# Patient Record
Sex: Female | Born: 1950
Health system: Southern US, Community
[De-identification: ages and names within clinical notes are randomized; demographics above are authoritative.]

## PROBLEM LIST (undated history)

## (undated) DIAGNOSIS — K219 Gastro-esophageal reflux disease without esophagitis: Secondary | ICD-10-CM

## (undated) DIAGNOSIS — T8859XA Other complications of anesthesia, initial encounter: Secondary | ICD-10-CM

## (undated) DIAGNOSIS — E039 Hypothyroidism, unspecified: Secondary | ICD-10-CM

## (undated) DIAGNOSIS — S99911A Unspecified injury of right ankle, initial encounter: Secondary | ICD-10-CM

## (undated) DIAGNOSIS — R112 Nausea with vomiting, unspecified: Secondary | ICD-10-CM

## (undated) DIAGNOSIS — Z8379 Family history of other diseases of the digestive system: Secondary | ICD-10-CM

## (undated) DIAGNOSIS — Z9889 Other specified postprocedural states: Secondary | ICD-10-CM

## (undated) DIAGNOSIS — J45909 Unspecified asthma, uncomplicated: Secondary | ICD-10-CM

## (undated) DIAGNOSIS — C649 Malignant neoplasm of unspecified kidney, except renal pelvis: Secondary | ICD-10-CM

## (undated) DIAGNOSIS — N189 Chronic kidney disease, unspecified: Secondary | ICD-10-CM

## (undated) DIAGNOSIS — M25579 Pain in unspecified ankle and joints of unspecified foot: Secondary | ICD-10-CM

## (undated) DIAGNOSIS — J449 Chronic obstructive pulmonary disease, unspecified: Secondary | ICD-10-CM

## (undated) DIAGNOSIS — Z85528 Personal history of other malignant neoplasm of kidney: Secondary | ICD-10-CM

## (undated) DIAGNOSIS — F0781 Postconcussional syndrome: Secondary | ICD-10-CM

## (undated) DIAGNOSIS — C4491 Basal cell carcinoma of skin, unspecified: Secondary | ICD-10-CM

## (undated) DIAGNOSIS — R51 Headache: Secondary | ICD-10-CM

## (undated) DIAGNOSIS — G4700 Insomnia, unspecified: Principal | ICD-10-CM

## (undated) DIAGNOSIS — Z90710 Acquired absence of both cervix and uterus: Secondary | ICD-10-CM

## (undated) DIAGNOSIS — L409 Psoriasis, unspecified: Secondary | ICD-10-CM

## (undated) HISTORY — DX: Basal cell carcinoma of skin, unspecified: C44.91

## (undated) HISTORY — PX: BREAST BIOPSY: SHX20

## (undated) HISTORY — DX: Chronic kidney disease, unspecified: N18.9

## (undated) HISTORY — DX: Postconcussional syndrome: F07.81

## (undated) HISTORY — PX: ELBOW SURGERY: SHX618

## (undated) HISTORY — DX: Insomnia, unspecified: G47.00

## (undated) HISTORY — DX: Malignant neoplasm of unspecified kidney, except renal pelvis: C64.9

## (undated) HISTORY — DX: Headache: R51

## (undated) HISTORY — PX: BACK SURGERY: SHX140

## (undated) HISTORY — PX: ABDOMINAL HYSTERECTOMY: SHX81

## (undated) HISTORY — PX: NEPHRECTOMY: SHX65

---

## 1977-10-15 HISTORY — PX: OTHER SURGICAL HISTORY: SHX169

## 1993-10-15 HISTORY — PX: ABDOMINAL HYSTERECTOMY: SHX81

## 1994-10-15 HISTORY — PX: CHOLECYSTECTOMY: SHX55

## 1995-10-16 HISTORY — PX: ELBOW SURGERY: SHX618

## 1998-07-13 ENCOUNTER — Other Ambulatory Visit: Admission: RE | Admit: 1998-07-13 | Discharge: 1998-07-13 | Payer: Self-pay | Admitting: Obstetrics and Gynecology

## 1998-07-22 ENCOUNTER — Other Ambulatory Visit: Admission: RE | Admit: 1998-07-22 | Discharge: 1998-07-22 | Payer: Self-pay | Admitting: Obstetrics and Gynecology

## 1998-09-26 ENCOUNTER — Ambulatory Visit (HOSPITAL_COMMUNITY): Admission: RE | Admit: 1998-09-26 | Discharge: 1998-09-26 | Payer: Self-pay | Admitting: Surgery

## 1998-09-26 ENCOUNTER — Encounter: Payer: Self-pay | Admitting: Surgery

## 1999-08-01 ENCOUNTER — Other Ambulatory Visit: Admission: RE | Admit: 1999-08-01 | Discharge: 1999-08-01 | Payer: Self-pay | Admitting: Obstetrics and Gynecology

## 2000-09-25 ENCOUNTER — Other Ambulatory Visit: Admission: RE | Admit: 2000-09-25 | Discharge: 2000-09-25 | Payer: Self-pay | Admitting: Obstetrics and Gynecology

## 2000-10-15 HISTORY — PX: OTHER SURGICAL HISTORY: SHX169

## 2001-06-07 ENCOUNTER — Emergency Department (HOSPITAL_COMMUNITY): Admission: EM | Admit: 2001-06-07 | Discharge: 2001-06-07 | Payer: Self-pay | Admitting: Emergency Medicine

## 2001-06-07 ENCOUNTER — Encounter: Payer: Self-pay | Admitting: *Deleted

## 2001-06-10 ENCOUNTER — Emergency Department (HOSPITAL_COMMUNITY): Admission: EM | Admit: 2001-06-10 | Discharge: 2001-06-10 | Payer: Self-pay | Admitting: *Deleted

## 2001-10-13 ENCOUNTER — Other Ambulatory Visit: Admission: RE | Admit: 2001-10-13 | Discharge: 2001-10-13 | Payer: Self-pay | Admitting: Obstetrics and Gynecology

## 2001-10-15 HISTORY — PX: STOMACH SURGERY: SHX791

## 2001-10-29 ENCOUNTER — Encounter: Payer: Self-pay | Admitting: Family Medicine

## 2001-10-29 ENCOUNTER — Ambulatory Visit (HOSPITAL_COMMUNITY): Admission: RE | Admit: 2001-10-29 | Discharge: 2001-10-29 | Payer: Self-pay | Admitting: Family Medicine

## 2002-01-05 ENCOUNTER — Emergency Department (HOSPITAL_COMMUNITY): Admission: EM | Admit: 2002-01-05 | Discharge: 2002-01-05 | Payer: Self-pay | Admitting: *Deleted

## 2002-01-05 ENCOUNTER — Encounter: Payer: Self-pay | Admitting: *Deleted

## 2002-01-05 ENCOUNTER — Encounter: Payer: Self-pay | Admitting: Emergency Medicine

## 2002-01-10 ENCOUNTER — Encounter: Payer: Self-pay | Admitting: Emergency Medicine

## 2002-01-10 ENCOUNTER — Emergency Department (HOSPITAL_COMMUNITY): Admission: EM | Admit: 2002-01-10 | Discharge: 2002-01-10 | Payer: Self-pay | Admitting: Emergency Medicine

## 2002-07-15 ENCOUNTER — Emergency Department (HOSPITAL_COMMUNITY): Admission: EM | Admit: 2002-07-15 | Discharge: 2002-07-15 | Payer: Self-pay | Admitting: Emergency Medicine

## 2002-07-15 ENCOUNTER — Encounter: Payer: Self-pay | Admitting: Emergency Medicine

## 2002-10-09 ENCOUNTER — Ambulatory Visit (HOSPITAL_COMMUNITY): Admission: RE | Admit: 2002-10-09 | Discharge: 2002-10-09 | Payer: Self-pay | Admitting: General Surgery

## 2002-10-09 ENCOUNTER — Encounter: Payer: Self-pay | Admitting: General Surgery

## 2002-10-15 HISTORY — PX: BREAST BIOPSY: SHX20

## 2003-07-01 ENCOUNTER — Encounter: Payer: Self-pay | Admitting: Orthopedic Surgery

## 2003-07-01 ENCOUNTER — Encounter: Admission: RE | Admit: 2003-07-01 | Discharge: 2003-07-01 | Payer: Self-pay | Admitting: Orthopedic Surgery

## 2003-12-09 ENCOUNTER — Other Ambulatory Visit: Admission: RE | Admit: 2003-12-09 | Discharge: 2003-12-09 | Payer: Self-pay | Admitting: Obstetrics and Gynecology

## 2003-12-14 ENCOUNTER — Encounter: Admission: RE | Admit: 2003-12-14 | Discharge: 2003-12-14 | Payer: Self-pay | Admitting: Obstetrics and Gynecology

## 2004-05-19 ENCOUNTER — Ambulatory Visit (HOSPITAL_COMMUNITY): Admission: RE | Admit: 2004-05-19 | Discharge: 2004-05-19 | Payer: Self-pay | Admitting: Neurosurgery

## 2004-05-26 ENCOUNTER — Ambulatory Visit (HOSPITAL_COMMUNITY): Admission: RE | Admit: 2004-05-26 | Discharge: 2004-05-27 | Payer: Self-pay | Admitting: Neurosurgery

## 2004-08-25 ENCOUNTER — Ambulatory Visit (HOSPITAL_COMMUNITY): Admission: RE | Admit: 2004-08-25 | Discharge: 2004-08-25 | Payer: Self-pay | Admitting: Neurosurgery

## 2004-09-01 ENCOUNTER — Ambulatory Visit (HOSPITAL_COMMUNITY): Admission: RE | Admit: 2004-09-01 | Discharge: 2004-09-02 | Payer: Self-pay | Admitting: Neurosurgery

## 2004-11-30 ENCOUNTER — Ambulatory Visit (HOSPITAL_COMMUNITY): Admission: RE | Admit: 2004-11-30 | Discharge: 2004-11-30 | Payer: Self-pay | Admitting: Neurosurgery

## 2004-12-15 ENCOUNTER — Inpatient Hospital Stay (HOSPITAL_COMMUNITY): Admission: RE | Admit: 2004-12-15 | Discharge: 2004-12-19 | Payer: Self-pay | Admitting: Neurosurgery

## 2004-12-31 ENCOUNTER — Emergency Department (HOSPITAL_COMMUNITY): Admission: EM | Admit: 2004-12-31 | Discharge: 2004-12-31 | Payer: Self-pay | Admitting: Emergency Medicine

## 2005-07-06 ENCOUNTER — Ambulatory Visit (HOSPITAL_COMMUNITY): Admission: RE | Admit: 2005-07-06 | Discharge: 2005-07-06 | Payer: Self-pay | Admitting: Neurosurgery

## 2005-07-17 ENCOUNTER — Encounter: Admission: RE | Admit: 2005-07-17 | Discharge: 2005-07-17 | Payer: Self-pay | Admitting: Neurosurgery

## 2005-08-13 ENCOUNTER — Encounter: Admission: RE | Admit: 2005-08-13 | Discharge: 2005-08-13 | Payer: Self-pay | Admitting: Neurosurgery

## 2005-09-21 ENCOUNTER — Ambulatory Visit (HOSPITAL_COMMUNITY): Admission: RE | Admit: 2005-09-21 | Discharge: 2005-09-21 | Payer: Self-pay | Admitting: Neurosurgery

## 2007-08-27 ENCOUNTER — Ambulatory Visit (HOSPITAL_COMMUNITY): Admission: RE | Admit: 2007-08-27 | Discharge: 2007-08-27 | Payer: Self-pay | Admitting: Family Medicine

## 2007-09-01 ENCOUNTER — Ambulatory Visit (HOSPITAL_COMMUNITY): Admission: RE | Admit: 2007-09-01 | Discharge: 2007-09-01 | Payer: Self-pay | Admitting: Family Medicine

## 2007-10-16 DIAGNOSIS — N189 Chronic kidney disease, unspecified: Secondary | ICD-10-CM

## 2007-10-16 HISTORY — DX: Chronic kidney disease, unspecified: N18.9

## 2007-10-16 HISTORY — PX: NEPHRECTOMY: SHX65

## 2008-03-25 ENCOUNTER — Ambulatory Visit (HOSPITAL_COMMUNITY): Admission: RE | Admit: 2008-03-25 | Discharge: 2008-03-25 | Payer: Self-pay | Admitting: Internal Medicine

## 2008-03-26 ENCOUNTER — Emergency Department (HOSPITAL_COMMUNITY): Admission: EM | Admit: 2008-03-26 | Discharge: 2008-03-26 | Payer: Self-pay | Admitting: Emergency Medicine

## 2008-04-01 ENCOUNTER — Ambulatory Visit (HOSPITAL_COMMUNITY): Admission: RE | Admit: 2008-04-01 | Discharge: 2008-04-01 | Payer: Self-pay | Admitting: Family Medicine

## 2008-04-16 ENCOUNTER — Encounter (HOSPITAL_COMMUNITY): Admission: RE | Admit: 2008-04-16 | Discharge: 2008-05-16 | Payer: Self-pay | Admitting: Oncology

## 2008-04-16 ENCOUNTER — Ambulatory Visit (HOSPITAL_COMMUNITY): Payer: Self-pay | Admitting: Oncology

## 2008-05-26 ENCOUNTER — Ambulatory Visit (HOSPITAL_COMMUNITY): Admission: RE | Admit: 2008-05-26 | Discharge: 2008-05-26 | Payer: Self-pay | Admitting: Urology

## 2008-06-14 ENCOUNTER — Encounter (INDEPENDENT_AMBULATORY_CARE_PROVIDER_SITE_OTHER): Payer: Self-pay | Admitting: Urology

## 2008-06-14 ENCOUNTER — Inpatient Hospital Stay (HOSPITAL_COMMUNITY): Admission: RE | Admit: 2008-06-14 | Discharge: 2008-06-17 | Payer: Self-pay | Admitting: Urology

## 2008-10-15 HISTORY — PX: BACK SURGERY: SHX140

## 2008-12-28 ENCOUNTER — Ambulatory Visit (HOSPITAL_COMMUNITY): Admission: RE | Admit: 2008-12-28 | Discharge: 2008-12-28 | Payer: Self-pay | Admitting: Family Medicine

## 2009-01-13 ENCOUNTER — Ambulatory Visit (HOSPITAL_COMMUNITY): Admission: RE | Admit: 2009-01-13 | Discharge: 2009-01-13 | Payer: Self-pay | Admitting: Urology

## 2009-03-22 ENCOUNTER — Ambulatory Visit (HOSPITAL_COMMUNITY): Admission: RE | Admit: 2009-03-22 | Discharge: 2009-03-22 | Payer: Self-pay | Admitting: Family Medicine

## 2009-07-27 ENCOUNTER — Ambulatory Visit (HOSPITAL_COMMUNITY): Admission: RE | Admit: 2009-07-27 | Discharge: 2009-07-27 | Payer: Self-pay | Admitting: Family Medicine

## 2009-11-18 ENCOUNTER — Ambulatory Visit (HOSPITAL_COMMUNITY): Admission: RE | Admit: 2009-11-18 | Discharge: 2009-11-18 | Payer: Self-pay | Admitting: Family Medicine

## 2009-12-26 ENCOUNTER — Ambulatory Visit (HOSPITAL_COMMUNITY): Admission: RE | Admit: 2009-12-26 | Discharge: 2009-12-26 | Payer: Self-pay | Admitting: Neurosurgery

## 2010-04-03 ENCOUNTER — Ambulatory Visit (HOSPITAL_COMMUNITY): Admission: RE | Admit: 2010-04-03 | Discharge: 2010-04-03 | Payer: Self-pay | Admitting: Internal Medicine

## 2010-10-15 HISTORY — PX: OTHER SURGICAL HISTORY: SHX169

## 2010-11-04 ENCOUNTER — Encounter: Payer: Self-pay | Admitting: Neurosurgery

## 2010-11-05 ENCOUNTER — Encounter: Payer: Self-pay | Admitting: Family Medicine

## 2010-12-04 ENCOUNTER — Ambulatory Visit (HOSPITAL_COMMUNITY)
Admission: RE | Admit: 2010-12-04 | Discharge: 2010-12-04 | Disposition: A | Discharge status: Home / Self Care | Payer: 59 | Source: Ambulatory Visit | Attending: Family Medicine | Admitting: Family Medicine

## 2010-12-04 ENCOUNTER — Other Ambulatory Visit (HOSPITAL_COMMUNITY): Payer: Self-pay | Admitting: Family Medicine

## 2010-12-04 DIAGNOSIS — R05 Cough: Secondary | ICD-10-CM

## 2010-12-04 DIAGNOSIS — R059 Cough, unspecified: Secondary | ICD-10-CM

## 2010-12-04 DIAGNOSIS — R490 Dysphonia: Secondary | ICD-10-CM | POA: Insufficient documentation

## 2011-01-25 LAB — CREATININE, SERUM
Creatinine, Ser: 0.59 mg/dL (ref 0.4–1.2)
GFR calc Af Amer: >60 mL/min (ref >60)
GFR calc non Af Amer: >60 mL/min (ref >60)

## 2011-02-27 NOTE — Op Note (Signed)
NAME:  FORREST, DEMURO NO.:  [id number]   MEDICAL RECORD NO.:  [id number]          PATIENT TYPE:  INP   LOCATION:  1402                         FACILITY:  [facility]   PHYSICIAN:  Heloise Purpura, MD      DATE OF BIRTH:  November 26, 1950   DATE OF PROCEDURE:  06/14/2008  DATE OF DISCHARGE:                               OPERATIVE REPORT   PREOPERATIVE DIAGNOSIS:  Right renal mass.   POSTOPERATIVE DIAGNOSIS:  Right renal mass.   PROCEDURE:  Right robotic assisted laparoscopic partial nephrectomy.   SURGEON:  Heloise Purpura, MD   ASSISTANT:  Dr. Delman Kitten and Delia Chimes, nurse practitioner.   ANESTHESIA:  General.   COMPLICATIONS:  None.   ESTIMATED BLOOD LOSS:  50 mL.   INTRAVENOUS FLUIDS:  2000 mL of crystalloid and 500 mL of colloid.   SPECIMENS:  1. Right renal mass.  2. Perinephric fat.  3. Margin from the base of renal tumor resection.   DISPOSITION:  Specimens to pathology.   INTRAOPERATIVE FINDINGS:  Intraoperative margins were negative for  malignancy.   INDICATIONS:  Ms. Signore is a 60 year old female who was found to have an  incidentally detected small right renal mass that was enhancing on CT  scan and concerning for a renal cell carcinoma.  She underwent  metastatic evaluation which was negative.  After discussion regarding  management options for treatment, she elected to proceed with nephron  sparing surgery and the above procedure.  Potential risks,  complications, alternative options were discussed in detail with the  patient and informed consent was obtained.   DESCRIPTION OF PROCEDURE:  The patient was taken to the operating room  and general anesthetic was administered.  She was given preoperative  antibiotics, and placed in the right modified flank position, care was  taken to pad all potential pressure points.  Her abdomen was prepped and  draped in the usual sterile fashion.  A site was selected just to the  right of the umbilicus for  placement of the camera port.  This was  placed using a standard open Hassan technique which allowed entry into  the peritoneal cavity under direct vision without difficulty using a  standard Hassan technique.  A 12 mm port was then placed and a  pneumoperitoneum established.  The 0 degrees lens was used to inspect  the abdomen and there was no evidence for any intra-abdominal injuries.  There was noted to be adhesions in the right lower quadrant likely  result of the patient's prior hysterectomy.  A 12 mm port was placed in  the midline superior to the umbilicus and an 8 mm port was placed in the  right upper quadrant.  Laparoscopic scissors were then used to take down  the adhesions between the colon and the abdominal wall and the right  lower quadrant.  Two additional 8 mm robotic ports were then placed in  the right lower quadrant and the surgical cart was docked.  With the aid  of the cautery scissors, the white line of Toldt was incised along the  ascending colon allowing  the colon to be mobilized medially and the  space between the mesocolon and the anterior layer of Gerota's fascia to  be developed.  The patient's lower pole renal mass was almost  immediately identified and the lower pole of the kidney was carefully  freed.  The ureter was identified and traced superiorly and the renal  hilum was encountered.  A single renal vein was seen and inferior to  this renal vein there appeared to be a single renal artery that was  trifurcating toward the renal hilum consistent with the patient's  preoperative imaging.  Once the renal artery was isolated proximal to  its trifurcation, attention returned to the kidney.  The overlying  Gerota's fascia was removed from the kidney in order to free up the  lower pole of the kidney and isolate the renal mass.  The perinephric  fat overlying the renal mass was sent as a pathologic specimen.  Preparations were then made for removal of the mass and  12.5 grams of  intravenous mannitol was administered.  After an appropriate amount of  time, the renal artery was clamped with laparoscopic bulldog clamp and  the renal mass was excised with cold scissors dissection.  The margins  from the base of the resection site were sent intraoperatively for  frozen section analysis and subsequently returned negative for  malignancy.  Once the mass had been removed from the kidney.  Attention  turned to repair of the renal defect.  A 4-0 Vicryl suture was used to  close the obvious bleeding sites along the base of the resection site.  No obvious entry into the collecting system was identified.  Laparoscopic ties were used to secure the 4-0 Vicryl.  A double-armed 2-  0 Monocryl suture was then used to perform a bolster stitch to  reapproximate the renal capsule.  A Surgicel bolster was placed in the  defect prior to securing these capsular sutures.  In addition, FloSeal  was placed into the defect as an additional hemostatic measure.  At this  point the bulldog clamp was removed.  Total warm ischemia time was 15  minutes.  The lower pole of the kidney and renal hilum were examined and  hemostasis appeared excellent.  Additional FloSeal was administered and  the Gerota's fascia overlying lower pole the kidney was reapproximated  with Hem-o-lok clips.  A #15 Blake drain was then placed through the  lower quadrant 8-mm port site and positioned in the perinephric space.  It was secured to skin with a nylon suture.  The surgical cart was then  undocked.  The initial 12-mm camera port site was closed with a figure-  of-eight 0 Vicryl suture placed with the aid of the Carter-Thomason  needle.  The remaining ports were removed under direct vision and the  specimen was removed intact from the superior midline port site within  the Endopouch retrieval bag.  This fascial opening was then closed with  figure-of-eight 0 Vicryl suture.  All port sites were injected  with  0.25% Marcaine and reapproximated the skin with 4-0 Monocryl  subcuticular closures.  Dermabond was placed over the incision sites.  The patient appeared to tolerate the procedure well without  complications.  She was able to be awakened and transferred to recovery  unit in satisfactory condition.      Heloise Purpura, MD  Electronically Signed     LB/MEDQ  D:  06/14/2008  T:  06/15/2008  Job:  [id number]

## 2011-02-27 NOTE — Discharge Summary (Signed)
Theresa Holloway, Theresa Holloway NO.:  [id number]   MEDICAL RECORD NO.:  [id number]          PATIENT TYPE:  INP   LOCATION:  1402                         FACILITY:  [facility]   PHYSICIAN:  Heloise Purpura, MD      DATE OF BIRTH:  1951/02/21   DATE OF ADMISSION:  06/14/2008  DATE OF DISCHARGE:  06/17/2008                               DISCHARGE SUMMARY   ADMISSION DIAGNOSIS:  Right renal mass.   DISCHARGE DIAGNOSIS:  Renal cell carcinoma.   HISTORY AND PHYSICAL:  For full details please see admission history and  physical.  Briefly, Ms. Patnaude is a 60 year old female who was found to  have an incidentally detected small right renal mass.  After a workup  for hypercalcemia.  After undergoing a metastatic evaluation which was  negative and discussion regarding management options for treatment, she  elected to proceed with a minimally invasive partial nephrectomy.   HOSPITAL COURSE:  On June 14, 2008, the patient was taken to the  operating room and underwent a right robotic-assisted laparoscopic  partial nephrectomy.  She tolerated this procedure well without  complications.  Postoperatively, she was able to be transferred to a  regular hospital room following recovery from anesthesia.  She was kept  on strict bedrest for 24 hours and hemodynamically monitored.  She  remained stable without evidence for bleeding.  In addition, her renal  function remained stable with a serum creatinine of 0.6.  She began  ambulating on the afternoon of postoperative day #1 which she did  without significant difficulty.  She also tolerated a clear liquid diet  and was able to be transitioned to oral pain medication.  She maintained  minimal output from her pelvic drain which was sent for a creatinine  level on postoperative day #2 and returned consistent with serum at 0.5.  It was therefore removed.  She continued to have her diet advanced which  she tolerated, and by the morning of postoperative  day #3 was tolerating  a regular diet and her pain was well-controlled with oral pain  medication.  She was therefore able to be discharged home in excellent  condition.   DISPOSITION:  Home.   DISCHARGE MEDICATIONS:  She was instructed to resume her regular home  medications excepting any aspirin, nonsteroidal anti-inflammatory drugs,  or herbal supplements.  She was given a prescription to take Vicodin as  needed for pain and told to use Colace as a stool softener.   DISCHARGE INSTRUCTIONS:  She was instructed be ambulatory but  specifically told to refrain from any heavy lifting, strenuous activity,  or driving.  She was instructed to be active and to resume regular diet.   FOLLOW UP:  She will follow up in approximately 1-2 weeks for further  evaluation.   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  This result was discussed with the patient during her  hospitalization.      Heloise Purpura, MD  Electronically Signed  LB/MEDQ  D:  06/17/2008  T:  06/17/2008  Job:  [id number]

## 2011-03-02 NOTE — H&P (Signed)
NAME:  Theresa Holloway, Theresa Holloway                          ACCOUNT NO.:  [id number]   MEDICAL RECORD NO.:  [id number]                   PATIENT TYPE:  OIB   LOCATION:  NA                                   FACILITY:  [facility]   PHYSICIAN:  Hilda Lias, M.D.                DATE OF BIRTH:  1950-11-18   DATE OF ADMISSION:  05/26/2004  DATE OF DISCHARGE:                                HISTORY & PHYSICAL   Ms. Dingwall is a lady who was seen by me about a week ago in my office because  of several weeks of back pain with radiation down the right leg associated  with numbness and tingling sensation.  The patient has had conservative  treatment.  She is not any better.  When she came to see me, she was having  so much pain that she was unable to sit.  We tried to do a MRI, but she was  claustrophobic.  Later on we sedated her and a MRI was obtained and because  of the findings, she wanted to proceed with surgery.  She denies any pain in  the left leg.   PAST MEDICAL HISTORY:  1. Cholecystectomy.  2. Stomach surgery.  3. Shoulder surgery.   ALLERGIES:  1. CODEINE.  2. SULFA.  3. CORTISONE.  4. PENICILLIN.   The patient never drinks.  She smokes.   FAMILY HISTORY:  Unremarkable.   REVIEW OF SYSTEMS:  Positive for abdominal pain, some history of vomiting,  back and right leg pain.   PHYSICAL EXAMINATION:  GENERAL:  When the patient came to my office she was  limping from the right leg.  HEENT:  Normal.  NECK:  Normal.  LUNGS:  Clear.  HEART:  Sounds normal.  ABDOMEN:  There is a scar on the abdomen from previous surgery.  EXTREMITIES:  There is a scar on the shoulder.  NEURO:  Mental status normal.  Cranial nerves normal.  Strength 5/5, except  for some weakness of flexion of the right leg.  Sensation, she complains of  numbness mostly on the right foot.   The MRI __________ showed that she has a large herniated disk at the level 4-  5 central to the right.   CLINICAL IMPRESSION:  Right  L4-L5 herniated disk.   RECOMMENDATIONS:  The patient is being admitted for surgery.  Procedure will  be a right L4-L5 diskectomy.  She knows about the risks of infection, CNS  leak, worsening pain, paralysis, need for further surgery, damage to the  vessels of the abdomen.                                                Hilda Lias, M.D.    EB/MEDQ  D:  05/26/2004  T:  05/26/2004  Job:  [id number]

## 2011-03-02 NOTE — Op Note (Signed)
NAME:  Theresa Holloway, Theresa Holloway NO.:  [id number]   MEDICAL RECORD NO.:  [id number]          PATIENT TYPE:  OIB   LOCATION:  5008                         FACILITY:  [facility]   PHYSICIAN:  Hilda Lias, M.D.   DATE OF BIRTH:  1951/01/01   DATE OF PROCEDURE:  09/01/2004  DATE OF DISCHARGE:                                 OPERATIVE REPORT   PREOPERATIVE DIAGNOSIS:  Right L4-5 herniated disk, recurrent.   POSTOPERATIVE DIAGNOSIS:  Right L4-5 herniated disk, recurrent.   PROCEDURE:  Right L4-5 laminotomy, removal of four large prevertebral disks,  total diskectomy, decompression of the L5 and L4 nerve root, microscopy.   SURGEON:  Hilda Lias, M.D.   ASSISTANT:  Coletta Memos, M.D.   CLINICAL HISTORY:  The patient was admitted because of back and right leg  pain. The patient several months ago underwent right L4-5 diskectomy, and  she did really well, but now she is complaining down the right leg. She has  failed conservative care. MRI shows a large recurrent disk at L4-5  compromising the L4 and L5 nerve roots. Surgery was advised, and the risks  were explained in the history and physical.   PROCEDURE:  The patient was taken to the OR. After intubation, she was  positioned in appropriate manner. The back was prepped with Betadine. A  midline incision following the previous one was made, and muscles were  retracted laterally. X-rays showed that indeed we were at the level of L4-5.  Then with the drill, we made the space at the level of L4-5 bigger with  removal of more of the L4 and L5 lamina. Lysis of adhesions was done.  Indeed, we found that the L5 nerve root was displaced posterior and  laterally by large fragments of disk, three of them. Removal was done, and  after that, we ended up in the disk space for removal of degenerative disk  as well as degenerative end plate was removed. At the end, we had good  decompression of the L5-L4 nerve root. Valsalva maneuver was  negative. Depo-  Medrol and Fentanyl were left in the epidural space, and the wound was  closed with Vicryl and Steri-Strips.       EB/MEDQ  D:  09/01/2004  T:  09/02/2004  Job:  [id number]

## 2011-03-02 NOTE — Op Note (Signed)
NAMEPEARLINA, FRIEDLY NO.:  [id number]   MEDICAL RECORD NO.:  [id number]          PATIENT TYPE:  INP   LOCATION:  3035                         FACILITY:  [facility]   PHYSICIAN:  Hilda Lias, M.D.   DATE OF BIRTH:  09-18-1951   DATE OF PROCEDURE:  12/15/2004  DATE OF DISCHARGE:                                 OPERATIVE REPORT   PREOPERATIVE DIAGNOSIS:  Recurrent herniated disc L4-L5 x 3, degenerative  disc disease, chronic and acute L4-L5 radiculopathy.   POSTOPERATIVE DIAGNOSIS:  Recurrent herniated disc L4-L5 x 3, degenerative  disc disease, chronic and acute L4-L5 radiculopathy.   PROCEDURE:  Bilateral L4-L5 diskectomy, facetectomy, interbody fusion with  allograft and autograft, pedicle screws L4-L5, posterolateral arthrodesis  with out doing allograft, crosslink, Cell Saver, C-arm.   SURGEON:  Hilda Lias, M.D.   ASSISTANT:  Coletta Memos, M.D.   CLINICAL HISTORY:  The patient is being admitted because of back pain with  radiation down to his right leg.  The patient, in the past eight months, has  had two lumbar discectomies now and she came back with the same problem all  over again.  Because of this, we decided to go ahead with the fusion.  The  risks were explained during the history and physical.   PROCEDURE:  The patient was taken to the OR and positioned in a prone  manner.  The back was prepped with Betadine.  A midline incision following  the previous one was made. The muscles and fascia were retracted laterally.  X-ray showed that, indeed, we were at the level of L4-L5.  From then on, we  removed the spinal process of 4 and 5.  The patient has quite a bit of scar  tissue on the right side.  We removed the facet of L4 and we went laterally.  Lysis of adhesions for the L4 to L5 nerve root was done on the right side.  Then, we entered the disc space and, indeed, the patient had quite a bit of  degenerative disc with a large fragment of  approximately 4 by 3 by 2 cm.  After this was done, we entered the disc space bilaterally.  The endplates  were removed.  This was done with a curet.  Then, two pieces of dowel 12 by  26 were inserted.  In the midline and laterally, we used an autograft.  With  the C-arm, we entered the pedicles for L4-L5 and four pedicle screws of 5.5  by 40 was inserted.  Then, we went laterally at L4-L5 and a mix of autograft  was used for arthrodesis.  The pedicle was secured in place.  A crosslink  from left to right was used.  At the end, we had the area completely solid.  Valsalva maneuver was negative.  We used Tisseel in the epidural space.  From then on, the area was irrigated and closed with Vicryl and Steri-  Strips.  The patient tolerated the procedure well.      EB/MEDQ  D:  12/15/2004  T:  12/15/2004  Job:  [id number]

## 2011-03-02 NOTE — H&P (Signed)
NAMESHARISE, LIPPY NO.:  [id number]   MEDICAL RECORD NO.:  [id number]          PATIENT TYPE:  OIB   LOCATION:  2869                         FACILITY:  [facility]   PHYSICIAN:  Hilda Lias, M.D.   DATE OF BIRTH:  20-Feb-1951   DATE OF ADMISSION:  09/01/2004  DATE OF DISCHARGE:                                HISTORY & PHYSICAL   Ms. Candler is a lady who underwent lumbar diskectomy in August 2005 because  of herniated disk. She did really well, but now she came back to see me two  to three weeks ago with pain in the right leg. For the past two weeks the  pain has been going to her back, associated with weakness up to the point  that she had difficulty walking. She had an MRI and because of the findings  she is being admitted for surgery.   PAST MEDICAL HISTORY:  1.  Cholecystectomy.  2.  Stomach surgery.  3.  Right L4-5 diskectomy.   ALLERGIES:  CODEINE, PENICILLIN, MORPHINE, SULFA, and CORTISOL.   SOCIAL HISTORY:  She does not drink. She does smoke.   FAMILY HISTORY:  Unremarkable.   REVIEW OF SYSTEMS:  Positive for back pain, right leg pain, and weakness.   PHYSICAL EXAMINATION:  HEENT: Normal.  NECK: Normal.  LUNGS: Clear.  HEART: The heart sounds are normal.  ABDOMEN: There is a scar from previous surgery.  EXTREMITIES: Normal.  NEUROLOGIC: Mental status and cranial nerves normal. Strength, showed that  she had 3/5 dorsiflexion and plantar flexion of the right foot. Reflexes  symmetrical. Sensation--she has __________ sensation which involves the L5  nerve root.   IMPRESSION:  Right L4-5 herniated disk.   RECOMMENDATIONS:  The patient is being admitted for recurrent herniated disk  of L4-5. MRI shows indeed she has a herniated disk at that level. The  surgery will be a L4-5 diskectomy. The risks, __________, of infection, CSF  leak, and need for further surgery which might require fusion.       EB/MEDQ  D:  09/01/2004  T:  09/01/2004  Job:   [id number]

## 2011-03-02 NOTE — Discharge Summary (Signed)
Theresa Holloway, Theresa Holloway NO.:  [id number]   MEDICAL RECORD NO.:  [id number]          PATIENT TYPE:  INP   LOCATION:  3035                         FACILITY:  [facility]   PHYSICIAN:  Hilda Lias, M.D.   DATE OF BIRTH:  1951-06-10   DATE OF ADMISSION:  12/15/2004  DATE OF DISCHARGE:  12/19/2004                                 DISCHARGE SUMMARY   ADMITTING DIAGNOSES:  1.  Degenerative disk disease.  2.  Recurrent herniated disk L4, L5 times three.   FINAL DIAGNOSES:  1.  Degenerative disk disease.  2.  Recurrent herniated disk L4, L5 times three.   CLINICAL HISTORY:  The patient was admitted because of back pain radiating  down to the legs.  The patient, as stated previously, has in the past 6  months two herniated disks corrected by surgery, now she comes back with a  third one.  Because of that, __________ to proceed with back fusion at that  level.   LABORATORY:  Normal.   COURSE IN THE HOSPITAL:  The patient was taken to surgery and an L4 L5  diskectomy with replacement of the disk space with allograft, pedicle screws  and posterolateral arthrodesis was done.  Today, four days later, she is  doing great, she has no complaint whatsoever, and her pain has been managed  with p.o. medication.  She is being discharged today to be followed by me in  3 weeks.   CONDITION ON DISCHARGE:  Improving.   MEDICATIONS:  Percocet, Neurontin, Xanax and Flexeril.   DIET:  Regular.   ACTIVITY:  Not to drive for at least 2 weeks.   FOLLOW UP:  She will be seen by me in 4 weeks or before as needed.      EB/MEDQ  D:  12/19/2004  T:  12/19/2004  Job:  [id number]

## 2011-03-02 NOTE — H&P (Signed)
NAMELELIA, JONS NO.:  [id number]   MEDICAL RECORD NO.:  [id number]          PATIENT TYPE:  INP   LOCATION:  2899                         FACILITY:  [facility]   PHYSICIAN:  Hilda Lias, M.D.   DATE OF BIRTH:  1950-12-13   DATE OF ADMISSION:  12/15/2004  DATE OF DISCHARGE:                                HISTORY & PHYSICAL   Ms. Ausley is a lady who underwent twice lumbar diskectomy.  Both times the  patient did really well up to the point that she went back to work like two  to three weeks after without any complaints.  The weakness that she was  having completely resolved on both occasions.  Unfortunately, in the past  few weeks it seemed she developed cough and soon after she realized she was  having pain all over like before going to the right leg.  Conservative  treatment was given and she did not improve.  We have repeat MRI and this  lady, for the third time, has recurrent herniated disk at the level L4-5.  Because of that she wanted to go ahead with diskectomy, although, again my  feeling is that having had two diskectomies, she is a candidate to have more  recurrence.  At the end we agreed to go ahead and do a total diskectomy with  fusion.   PAST MEDICAL HISTORY:  1.  Twice lumbar diskectomy at the level 4-5.  2.  Cholecystectomy.  3.  Stomach surgery.   ALLERGIES:  CODEINE, PENICILLIN, MORPHINE, SULFA, CORTISOL.   SOCIAL HISTORY:  Negative.   FAMILY HISTORY:  Unremarkable.   REVIEW OF SYSTEMS:  Positive for back pain and right leg pain associated  with weakness.   PHYSICAL EXAMINATION:  HEENT:  Normal.  NECK:  Normal.  LUNGS:  Clear.  HEART:  Normal.  ABDOMEN:  Scar from previous surgery.  EXTREMITIES:  Normal.  NEUROLOGIC:  Mental status and cranial nerves normal.  Strength:  She has  weakness on dorsiflexion on the right foot.  Straight leg raising __________  positive of 30 degrees on the right side, about 60 degrees on the left side.  Sensation:  She complains of numbness which involves most of the L4 and L5  nerve root.   MRI showed degenerative disk disease with a recurrence __________.   CLINICAL IMPRESSION:  L4-5 recurrent herniated disk right side.   MANAGEMENT:  This patient is going to be admitted for surgery.  Procedure  will be a bilateral total disk diskectomy, interbody fusion pedicle screw,  posterolateral fusion.  The risk of recurrent infection, CSF leak, worsening  pain, paralysis, need for further surgery which might be at the level above  and below although both disks are normal.      EB/MEDQ  D:  12/15/2004  T:  12/15/2004  Job:  [id number]

## 2011-03-02 NOTE — Op Note (Signed)
NAME:  Theresa Holloway, Theresa Holloway                          ACCOUNT NO.:  [id number]   MEDICAL RECORD NO.:  [id number]                   PATIENT TYPE:  OIB   LOCATION:  3029                                 FACILITY:  [facility]   PHYSICIAN:  Hilda Lias, M.D.                DATE OF BIRTH:  Feb 16, 1951   DATE OF PROCEDURE:  05/26/2004  DATE OF DISCHARGE:  05/27/2004                                 OPERATIVE REPORT   PREOPERATIVE DIAGNOSIS:  Right L4 L5 herniated disk.   POSTOPERATIVE DIAGNOSIS:  Right L4 L5 herniated disk with a free fragment.   PROCEDURE:  1. Right L4-5 diskectomy, foraminotomy.  2. Microscopy.   SURGEON:  Botero.   CLINICAL HISTORY:  The patient was admitted because of back pain radiating  down to the right leg.  The patient had been in so much pain that she had  been unable to sleep.  She is quite uncomfortable.  I saw her in my office  and because of the finding in the MRI surgery was advised.  The risks were  explained in the history and physical.   PROCEDURE:  The patient was taken to the operating room.   After intubation, the back was prepped with Betadine.  A midline incision  was made from L4 to L5.  X-ray was taken which showed the problem was at the  level of L3 L4.  From then on we visualized the L4 and L5 lamina.  A  hemilaminotomy was done the drill.  The yellow ligament was also excised.  We brought the microscope into the area and we retracted the L5 nerve root  which was swollen.  Indeed, there was a large fragment compromising the  takeoff of L5.  Removal of free fragment was done.  There was opening in the  disk and we proceeded with __________ diskectomy.  Foraminotomy to  decompress the L4 and L5 nerve root was achieved.  At the end we have  decompression.  Valsalva maneuver was negative.  Fentanyl and Depo-Medrol  were left in the pleural space and the wound was closed with Vicryl and  Steri-Strip.                                               Hilda Lias, M.D.    EB/MEDQ  D:  05/26/2004  T:  05/28/2004  Job:  [id number]

## 2011-04-27 ENCOUNTER — Other Ambulatory Visit (HOSPITAL_COMMUNITY): Payer: Self-pay | Admitting: Family Medicine

## 2011-04-27 DIAGNOSIS — Z139 Encounter for screening, unspecified: Secondary | ICD-10-CM

## 2011-04-30 ENCOUNTER — Ambulatory Visit (HOSPITAL_COMMUNITY)
Admission: RE | Admit: 2011-04-30 | Discharge: 2011-04-30 | Disposition: A | Discharge status: Home / Self Care | Payer: 59 | Source: Ambulatory Visit | Attending: Family Medicine | Admitting: Family Medicine

## 2011-04-30 DIAGNOSIS — Z1231 Encounter for screening mammogram for malignant neoplasm of breast: Secondary | ICD-10-CM | POA: Insufficient documentation

## 2011-04-30 DIAGNOSIS — Z139 Encounter for screening, unspecified: Secondary | ICD-10-CM

## 2011-05-02 ENCOUNTER — Ambulatory Visit (HOSPITAL_COMMUNITY)
Admission: RE | Admit: 2011-05-02 | Discharge: 2011-05-02 | Disposition: A | Discharge status: Home / Self Care | Payer: 59 | Source: Ambulatory Visit | Attending: Sports Medicine | Admitting: Sports Medicine

## 2011-05-02 ENCOUNTER — Other Ambulatory Visit: Payer: Self-pay | Admitting: Family Medicine

## 2011-05-02 ENCOUNTER — Encounter (HOSPITAL_COMMUNITY): Payer: Self-pay | Admitting: Physical Therapy

## 2011-05-02 DIAGNOSIS — IMO0001 Reserved for inherently not codable concepts without codable children: Secondary | ICD-10-CM | POA: Insufficient documentation

## 2011-05-02 DIAGNOSIS — R262 Difficulty in walking, not elsewhere classified: Secondary | ICD-10-CM | POA: Insufficient documentation

## 2011-05-02 DIAGNOSIS — R928 Other abnormal and inconclusive findings on diagnostic imaging of breast: Secondary | ICD-10-CM

## 2011-05-02 DIAGNOSIS — M25473 Effusion, unspecified ankle: Secondary | ICD-10-CM | POA: Insufficient documentation

## 2011-05-02 DIAGNOSIS — M25476 Effusion, unspecified foot: Secondary | ICD-10-CM | POA: Insufficient documentation

## 2011-05-02 DIAGNOSIS — M6281 Muscle weakness (generalized): Secondary | ICD-10-CM | POA: Insufficient documentation

## 2011-05-02 DIAGNOSIS — M25579 Pain in unspecified ankle and joints of unspecified foot: Secondary | ICD-10-CM | POA: Insufficient documentation

## 2011-05-02 HISTORY — DX: Acquired absence of both cervix and uterus: Z90.710

## 2011-05-02 HISTORY — DX: Other specified postprocedural states: Z98.890

## 2011-05-02 HISTORY — DX: Personal history of other malignant neoplasm of kidney: Z85.528

## 2011-05-02 HISTORY — DX: Unspecified injury of right ankle, initial encounter: S99.911A

## 2011-05-02 HISTORY — DX: Family history of other diseases of the digestive system: Z83.79

## 2011-05-02 HISTORY — DX: Pain in unspecified ankle and joints of unspecified foot: M25.579

## 2011-05-02 NOTE — Progress Notes (Signed)
Physical Therapy Evaluation  Patient Name: Theresa Holloway Date: 05/02/2011   Time: 103-149 Charges: 1 eval, 10 TE   HISTORY: Pt is a 60 y.o caucasian female referred for 1x visit for PT secondary to R resolved sinus tarsi syndrome.  Pt c/co is pain, swelling and needs to strengthen the R foot before having surgery on L foot to repair her toes.  She states she was in a boot for 5 weeks and has just come out of the boot.  She is required to wear her LASO for outside activites.  Her last visit with Dr. Margaretha Sheffield revealed normal X-rays. Marland Kitchen MEDICAL HISTORY: 2 R ankle injury, kidney cancer, back surgery (4x), Gallbladder removal, right finger and elbow syergery, hysterecomty, R knee scope, l foot 2 operations, osteoporosis PREVIOUS THERAPY: Pt has not received therapy for this condition Cognitive Status: Pt is alert and oriented x's 4. Social History: Pt enjoys walking a few miles a day for weight lose.  Enjoys gardening,  Prior Level: Walking 2 miles without pain, able to complete all activities of household and work activities without pain.   Current Functional Status:  Immediate pain when walking and standing 9/10 pain.  Starts to decline after a couple of hours.   Barriers to Education: N/A. EMPLOYMENT DATA: currently working   SUBJECTIVE:  PATIENT GOAL: PAIN RATING: (on a scale from 0-10):  worst:  8/10        best:  0/10        current: 4 /10   Nature: Feels like someone is driving in with a screw driver, most pain in the morning.   Aggravating factors: Walking, Standing - immediate pain Alleviating factors: Rest OBJECTIVE: POSTURE: WNL  OBSERVATION:  Notable pocket of swelling to R sinus tarsi.  Pt comes in with flip flops.  Tight gastroc and soleus  AROM: WNL  STRENGTH: LE strength 5/5 except R hamstring: 3/5, R ankle eversion 4/5  NEUROLOGIC:   Sensation intact to RLE GAIT: Decreased heel strike, decreased toe off  FUNCTION: See above.  Balance:  R single leg stance: 22 sec on  static surface.  PALPATION: Severe pin point pain and tenderness over R dorsal sinus tarsi location with mild palpation.  No pain noted with resisted active to flexion or extension, no pain with plantar palpation, no pain with metatarsal mobilization.              Special Test: - compression, - bump test, - toe tap  Pt is a 60 year old female referred for a one time visit for HEP after resolved sinus tarsi syndrome.  After examination it was found that the she has current body structure impairments including: increased pain, impaired balance, and difficulty walking which are limiting her ability to participate in household and community/work activities. Pt will benefit from one time skilled PT service to address the above body structure impairments in order to maximize function in order to improve quality of life.      TREATMENT DIAGNOSIS:  Rehab Potential: Good/Fair/Poor  PLAN: 1. Therapeutic exercise to include stretching, strengthening and HEP. 2. Gait training 3. Balance re-education 4. Manual techniques and modalities as needed for pain reduction.  FREQUENCY / DURATION: 1x visit  SHORT TERM GOALS: 1x visit Patient will be able to: 1. Pt will be Independent in HEP in order to maximize therapeutic effect. LONG TERM GOALS:not appropriate  INITIAL TREATMENT: Initial evaluation, therapeutic exercise and education in HEP including 4 way ankle, single leg stance, hamstring and  gastroc stretch, ankle circles, towel scrunches and pick up. PRECAUTIONS: N/A. CONTRAINDICATIONS: N/A. Thank you for your referral!  _____________________________   05/02/11 Annett Fabian, PT        DATE             Theresa Holloway 05/02/2011, 3:05 PM

## 2011-05-08 ENCOUNTER — Ambulatory Visit
Admission: RE | Admit: 2011-05-08 | Discharge: 2011-05-08 | Disposition: A | Discharge status: Home / Self Care | Payer: 59 | Source: Ambulatory Visit | Attending: Family Medicine | Admitting: Family Medicine

## 2011-05-08 ENCOUNTER — Other Ambulatory Visit: Payer: Self-pay | Admitting: Family Medicine

## 2011-05-08 DIAGNOSIS — R928 Other abnormal and inconclusive findings on diagnostic imaging of breast: Secondary | ICD-10-CM

## 2011-05-14 ENCOUNTER — Other Ambulatory Visit: Payer: 59

## 2011-05-15 ENCOUNTER — Ambulatory Visit
Admission: RE | Admit: 2011-05-15 | Discharge: 2011-05-15 | Disposition: A | Discharge status: Home / Self Care | Payer: 59 | Source: Ambulatory Visit | Attending: Family Medicine | Admitting: Family Medicine

## 2011-05-15 ENCOUNTER — Other Ambulatory Visit: Payer: Self-pay | Admitting: Family Medicine

## 2011-05-15 DIAGNOSIS — R928 Other abnormal and inconclusive findings on diagnostic imaging of breast: Secondary | ICD-10-CM

## 2011-07-12 LAB — T4, FREE: Free T4: 0.83 — ABNORMAL LOW

## 2011-07-12 LAB — COMPREHENSIVE METABOLIC PANEL
ALT: 21
ALT: 26
Alkaline Phosphatase: 44
Alkaline Phosphatase: 40
BUN: 18
CO2: 33 — ABNORMAL HIGH
CO2: 28
Chloride: 92 — ABNORMAL LOW
Chloride: 93 — ABNORMAL LOW
Glucose, Bld: 117 — ABNORMAL HIGH
Glucose, Bld: 87
Potassium: 3 — ABNORMAL LOW
Potassium: 3.3 — ABNORMAL LOW
Sodium: 132 — ABNORMAL LOW
Total Bilirubin: 0.4
Total Protein: 6

## 2011-07-12 LAB — IRON AND TIBC
Iron: 32 — ABNORMAL LOW
TIBC: 517 — ABNORMAL HIGH

## 2011-07-12 LAB — DIFFERENTIAL
Basophils Relative: 1
Lymphocytes Relative: 20
Lymphs Abs: 2.1
Monocytes Relative: 5
Neutro Abs: 7.9 — ABNORMAL HIGH
Neutrophils Relative %: 73

## 2011-07-12 LAB — CBC
Hemoglobin: 13.6
MCHC: 32.9
RBC: 4.17
RDW: 13.7
WBC: 10.7 — ABNORMAL HIGH

## 2011-07-18 LAB — CBC
MCV: 92.4
Platelets: 304
RBC: 3.03 — ABNORMAL LOW
WBC: 8.1

## 2011-07-18 LAB — DIFFERENTIAL
Eosinophils Absolute: 0.1
Lymphocytes Relative: 29
Lymphs Abs: 2.4
Monocytes Relative: 8
Neutrophils Relative %: 61

## 2011-07-18 LAB — BASIC METABOLIC PANEL
CO2: 30
GFR calc Af Amer: >60
GFR calc non Af Amer: >60
Glucose, Bld: 139 — ABNORMAL HIGH
Potassium: 3.5
Sodium: 140

## 2011-07-18 LAB — CREATININE, FLUID (PLEURAL, PERITONEAL, JP DRAINAGE): Creat, Fluid: 0.5

## 2011-07-18 LAB — HEMOGLOBIN AND HEMATOCRIT, BLOOD: HCT: 30.3 — ABNORMAL LOW

## 2011-08-10 ENCOUNTER — Other Ambulatory Visit (HOSPITAL_COMMUNITY): Payer: Self-pay | Admitting: Urology

## 2011-08-10 ENCOUNTER — Ambulatory Visit (HOSPITAL_COMMUNITY)
Admission: RE | Admit: 2011-08-10 | Discharge: 2011-08-10 | Disposition: A | Discharge status: Home / Self Care | Payer: 59 | Source: Ambulatory Visit | Attending: Urology | Admitting: Urology

## 2011-08-10 DIAGNOSIS — C649 Malignant neoplasm of unspecified kidney, except renal pelvis: Secondary | ICD-10-CM | POA: Insufficient documentation

## 2012-04-22 ENCOUNTER — Other Ambulatory Visit: Payer: Self-pay | Admitting: Family Medicine

## 2012-04-22 DIAGNOSIS — Z1231 Encounter for screening mammogram for malignant neoplasm of breast: Secondary | ICD-10-CM

## 2012-05-02 ENCOUNTER — Ambulatory Visit
Admission: RE | Admit: 2012-05-02 | Discharge: 2012-05-02 | Disposition: A | Discharge status: Home / Self Care | Payer: 59 | Source: Ambulatory Visit | Attending: Family Medicine | Admitting: Family Medicine

## 2012-05-02 DIAGNOSIS — Z1231 Encounter for screening mammogram for malignant neoplasm of breast: Secondary | ICD-10-CM

## 2012-09-02 ENCOUNTER — Emergency Department (HOSPITAL_COMMUNITY)
Admission: EM | Admit: 2012-09-02 | Discharge: 2012-09-02 | Disposition: A | Discharge status: Home / Self Care | Payer: 59 | Attending: Emergency Medicine | Admitting: Emergency Medicine

## 2012-09-02 ENCOUNTER — Emergency Department (HOSPITAL_COMMUNITY): Payer: 59

## 2012-09-02 ENCOUNTER — Encounter (HOSPITAL_COMMUNITY): Payer: Self-pay

## 2012-09-02 DIAGNOSIS — M7750 Other enthesopathy of unspecified foot: Secondary | ICD-10-CM | POA: Insufficient documentation

## 2012-09-02 DIAGNOSIS — IMO0002 Reserved for concepts with insufficient information to code with codable children: Secondary | ICD-10-CM | POA: Insufficient documentation

## 2012-09-02 DIAGNOSIS — Z85528 Personal history of other malignant neoplasm of kidney: Secondary | ICD-10-CM | POA: Insufficient documentation

## 2012-09-02 DIAGNOSIS — M542 Cervicalgia: Secondary | ICD-10-CM | POA: Insufficient documentation

## 2012-09-02 DIAGNOSIS — M546 Pain in thoracic spine: Secondary | ICD-10-CM | POA: Insufficient documentation

## 2012-09-02 DIAGNOSIS — Z87828 Personal history of other (healed) physical injury and trauma: Secondary | ICD-10-CM | POA: Insufficient documentation

## 2012-09-02 DIAGNOSIS — S0083XA Contusion of other part of head, initial encounter: Secondary | ICD-10-CM

## 2012-09-02 DIAGNOSIS — S29012A Strain of muscle and tendon of back wall of thorax, initial encounter: Secondary | ICD-10-CM

## 2012-09-02 DIAGNOSIS — S0003XA Contusion of scalp, initial encounter: Secondary | ICD-10-CM | POA: Insufficient documentation

## 2012-09-02 DIAGNOSIS — Z8719 Personal history of other diseases of the digestive system: Secondary | ICD-10-CM | POA: Insufficient documentation

## 2012-09-02 DIAGNOSIS — Z9889 Other specified postprocedural states: Secondary | ICD-10-CM | POA: Insufficient documentation

## 2012-09-02 DIAGNOSIS — R51 Headache: Secondary | ICD-10-CM | POA: Insufficient documentation

## 2012-09-02 DIAGNOSIS — S1093XA Contusion of unspecified part of neck, initial encounter: Secondary | ICD-10-CM | POA: Insufficient documentation

## 2012-09-02 DIAGNOSIS — J45909 Unspecified asthma, uncomplicated: Secondary | ICD-10-CM | POA: Insufficient documentation

## 2012-09-02 DIAGNOSIS — Y9389 Activity, other specified: Secondary | ICD-10-CM | POA: Insufficient documentation

## 2012-09-02 DIAGNOSIS — M810 Age-related osteoporosis without current pathological fracture: Secondary | ICD-10-CM | POA: Insufficient documentation

## 2012-09-02 HISTORY — DX: Unspecified asthma, uncomplicated: J45.909

## 2012-09-02 MED ORDER — TRAMADOL HCL 50 MG PO TABS: 50.0000 mg | ORAL_TABLET | Freq: Four times a day (QID) | ORAL | Status: DC | PRN
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
## 2012-09-02 NOTE — ED Notes (Signed)
Knee pain charted in error

## 2012-09-08 ENCOUNTER — Ambulatory Visit (HOSPITAL_COMMUNITY)
Admission: RE | Admit: 2012-09-08 | Discharge: 2012-09-08 | Disposition: A | Discharge status: Home / Self Care | Payer: 59 | Source: Ambulatory Visit | Attending: Urology | Admitting: Urology

## 2012-09-08 ENCOUNTER — Other Ambulatory Visit (HOSPITAL_COMMUNITY): Payer: Self-pay | Admitting: Urology

## 2012-09-08 DIAGNOSIS — C649 Malignant neoplasm of unspecified kidney, except renal pelvis: Secondary | ICD-10-CM | POA: Insufficient documentation

## 2013-01-29 ENCOUNTER — Encounter: Payer: Self-pay | Admitting: Neurology

## 2013-01-29 ENCOUNTER — Ambulatory Visit (INDEPENDENT_AMBULATORY_CARE_PROVIDER_SITE_OTHER): Payer: 59 | Admitting: Neurology

## 2013-01-29 VITALS — BP 122/62 | HR 76 | Temp 96.8°F | Ht 67.0 in | Wt 149.0 lb

## 2013-01-29 DIAGNOSIS — R51 Headache: Secondary | ICD-10-CM

## 2013-01-29 DIAGNOSIS — R519 Headache, unspecified: Secondary | ICD-10-CM | POA: Insufficient documentation

## 2013-01-29 DIAGNOSIS — G4700 Insomnia, unspecified: Secondary | ICD-10-CM

## 2013-01-29 DIAGNOSIS — F0781 Postconcussional syndrome: Secondary | ICD-10-CM | POA: Insufficient documentation

## 2013-01-29 HISTORY — DX: Postconcussional syndrome: F07.81

## 2013-01-29 HISTORY — DX: Headache: R51

## 2013-01-29 HISTORY — DX: Insomnia, unspecified: G47.00

## 2013-01-29 MED ORDER — ZOLPIDEM TARTRATE 5 MG PO TABS: 5.0000 mg | ORAL_TABLET | Freq: Every evening | ORAL | Status: DC | PRN
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
## 2013-01-29 NOTE — Patient Instructions (Addendum)
I think overall you are doing fairly well and are stable at this point. For your insomnia I am suggesting low dose ambien, we talked about its side effects in clinic. Use it as needed, not every night.   I do have some generic suggestions for you today:  Please make sure that you drink plenty of fluids. I would like for you to exercise daily for example in the form of walking 20-30 minutes every day, if you can. Please keep a regular sleep-wake schedule, keep regular meal times, do not skip any meals, eat  healthy snacks in between meals, such as fruit or nuts. Try to eat protein with every meal.   Engage in social activities in your community and with your family and try to keep up with current events by reading the newspaper or watching the news.  I do not think we need to make any changes in your medications otherwise and I think you're stable enough that I can see you back as needed.

## 2013-04-16 ENCOUNTER — Other Ambulatory Visit: Payer: Self-pay

## 2013-04-16 DIAGNOSIS — Z1231 Encounter for screening mammogram for malignant neoplasm of breast: Secondary | ICD-10-CM

## 2013-05-06 ENCOUNTER — Ambulatory Visit
Admission: RE | Admit: 2013-05-06 | Discharge: 2013-05-06 | Disposition: A | Discharge status: Home / Self Care | Payer: 59 | Source: Ambulatory Visit

## 2013-05-06 DIAGNOSIS — Z1231 Encounter for screening mammogram for malignant neoplasm of breast: Secondary | ICD-10-CM

## 2013-09-21 ENCOUNTER — Other Ambulatory Visit: Payer: Self-pay | Admitting: Urology

## 2013-09-21 DIAGNOSIS — C649 Malignant neoplasm of unspecified kidney, except renal pelvis: Secondary | ICD-10-CM

## 2013-10-12 ENCOUNTER — Ambulatory Visit
Admission: RE | Admit: 2013-10-12 | Discharge: 2013-10-12 | Disposition: A | Discharge status: Home / Self Care | Payer: No Typology Code available for payment source | Source: Ambulatory Visit | Attending: Urology | Admitting: Urology

## 2013-10-12 DIAGNOSIS — C649 Malignant neoplasm of unspecified kidney, except renal pelvis: Secondary | ICD-10-CM

## 2013-10-12 MED ORDER — GADOBENATE DIMEGLUMINE 529 MG/ML IV SOLN
11.0000 mL | Freq: Once | INTRAVENOUS | Status: AC | PRN
  Administered 2013-10-12: 11 mL via INTRAVENOUS

## 2013-10-15 HISTORY — PX: OTHER SURGICAL HISTORY: SHX169

## 2013-10-30 ENCOUNTER — Ambulatory Visit (HOSPITAL_COMMUNITY)
Admission: RE | Admit: 2013-10-30 | Discharge: 2013-10-30 | Disposition: A | Discharge status: Home / Self Care | Payer: 59 | Source: Ambulatory Visit | Attending: Urology | Admitting: Urology

## 2013-10-30 ENCOUNTER — Other Ambulatory Visit: Payer: Self-pay | Admitting: Urology

## 2013-10-30 DIAGNOSIS — C649 Malignant neoplasm of unspecified kidney, except renal pelvis: Secondary | ICD-10-CM

## 2013-10-30 DIAGNOSIS — Z981 Arthrodesis status: Secondary | ICD-10-CM | POA: Insufficient documentation

## 2013-10-30 DIAGNOSIS — Z9089 Acquired absence of other organs: Secondary | ICD-10-CM | POA: Insufficient documentation

## 2013-11-17 ENCOUNTER — Other Ambulatory Visit: Payer: Self-pay | Admitting: Orthopedic Surgery

## 2013-11-17 DIAGNOSIS — M25569 Pain in unspecified knee: Secondary | ICD-10-CM

## 2013-11-18 ENCOUNTER — Ambulatory Visit
Admission: RE | Admit: 2013-11-18 | Discharge: 2013-11-18 | Disposition: A | Discharge status: Home / Self Care | Payer: 59 | Source: Ambulatory Visit | Attending: Orthopedic Surgery | Admitting: Orthopedic Surgery

## 2013-11-18 DIAGNOSIS — M25569 Pain in unspecified knee: Secondary | ICD-10-CM

## 2014-03-31 ENCOUNTER — Other Ambulatory Visit: Payer: Self-pay

## 2014-03-31 DIAGNOSIS — Z1231 Encounter for screening mammogram for malignant neoplasm of breast: Secondary | ICD-10-CM

## 2014-05-07 ENCOUNTER — Ambulatory Visit
Admission: RE | Admit: 2014-05-07 | Discharge: 2014-05-07 | Disposition: A | Discharge status: Home / Self Care | Payer: 59 | Source: Ambulatory Visit

## 2014-05-07 DIAGNOSIS — Z1231 Encounter for screening mammogram for malignant neoplasm of breast: Secondary | ICD-10-CM

## 2014-07-22 ENCOUNTER — Other Ambulatory Visit: Payer: Self-pay | Admitting: Podiatry

## 2014-07-22 ENCOUNTER — Encounter: Payer: Self-pay | Admitting: Podiatry

## 2014-07-22 ENCOUNTER — Ambulatory Visit (INDEPENDENT_AMBULATORY_CARE_PROVIDER_SITE_OTHER): Payer: 59 | Admitting: Podiatry

## 2014-07-22 ENCOUNTER — Ambulatory Visit: Payer: 59 | Admitting: Podiatry

## 2014-07-22 ENCOUNTER — Ambulatory Visit (INDEPENDENT_AMBULATORY_CARE_PROVIDER_SITE_OTHER): Payer: 59

## 2014-07-22 VITALS — BP 114/68 | HR 80 | Resp 16

## 2014-07-22 DIAGNOSIS — M2011 Hallux valgus (acquired), right foot: Secondary | ICD-10-CM

## 2014-07-22 DIAGNOSIS — M2012 Hallux valgus (acquired), left foot: Secondary | ICD-10-CM

## 2014-07-22 DIAGNOSIS — M2041 Other hammer toe(s) (acquired), right foot: Secondary | ICD-10-CM

## 2014-07-22 DIAGNOSIS — M205X1 Other deformities of toe(s) (acquired), right foot: Secondary | ICD-10-CM

## 2014-07-22 DIAGNOSIS — M21611 Bunion of right foot: Secondary | ICD-10-CM

## 2014-07-22 NOTE — Progress Notes (Signed)
Subjective:     Patient ID: Theresa Holloway, female   DOB: 1951-08-06, 63 y.o.   MRN: [id number]  Foot Pain   patient presents with painful bunion deformity right foot digital deformity fourth right pain in the outside of the fifth metatarsal right knowing that she's needed surgery but was unable to do until now. Left foot has done well with surgical intervention   Review of Systems  All other systems reviewed and are negative.      Objective:   Physical Exam  Nursing note and vitals reviewed. Constitutional: She is oriented to person, place, and time.  Cardiovascular: Intact distal pulses.   Musculoskeletal: Normal range of motion.  Neurological: She is oriented to person, place, and time.  Skin: Skin is warm.   neurovascular status found to be intact with muscle strength adequate range of motion within normal limits and digits well perfused. Patient is noted to have large hyperostosis medial aspect first metatarsal head right deviation of the right hallux against second toe deviated fourth toe and the transverse and frontal plane and prominence of the fifth metatarsal right with redness and pain. There is pain around the first and fifth metatarsal head with redness and also the fourth toe does have trauma     Assessment:     HAV deformity right along with hallux interphalangeus deformity right hammertoe deformity fourth right and Taylor's bunion right. Well-healed surgical sites left    Plan:     H&P and x-rays reviewed of both feet. Discussed right foot and reviewed conservative versus surgical treatment. Patient wants it fixed like her other foot and at this time I did allow her to read a consent form for correction of the right foot. Austin osteotomy fifth met osteotomy probable Akin osteotomy and distal arthroplasty fourth right were discussed and all complications were reviewed with patient. Patient wants surgery understanding risk and that total recovery. We'll take 6 months to  one year and also today I did dispense air fracture walker with instructions for postop usage and to get used to it prior to surgery and I went ahead and gave her her preop get. Encouraged to call with any other questions

## 2014-08-31 ENCOUNTER — Encounter: Payer: Self-pay | Admitting: Podiatry

## 2014-08-31 DIAGNOSIS — M21549 Acquired clubfoot, unspecified foot: Secondary | ICD-10-CM

## 2014-08-31 DIAGNOSIS — M2011 Hallux valgus (acquired), right foot: Secondary | ICD-10-CM

## 2014-08-31 DIAGNOSIS — M21541 Acquired clubfoot, right foot: Secondary | ICD-10-CM

## 2014-08-31 DIAGNOSIS — M2041 Other hammer toe(s) (acquired), right foot: Secondary | ICD-10-CM

## 2014-09-01 ENCOUNTER — Telehealth: Payer: Self-pay

## 2014-09-01 NOTE — Telephone Encounter (Signed)
Spoke with patient regarding post operative status. She stated that she was doing well and managing pain effectively. Advised to ice, elevate and keep sterile dressing dry. Denied s/s of infection, fever, n/v or chills.

## 2014-09-02 NOTE — Progress Notes (Signed)
Dr Paulla Dolly performed a right Aiken osteotomy and [facility] bunion repair, right 5th met osteotomy and right 4th met hammertoe repair on 08/31/14

## 2014-09-06 ENCOUNTER — Encounter: Payer: Self-pay | Admitting: Podiatry

## 2014-09-06 ENCOUNTER — Ambulatory Visit (INDEPENDENT_AMBULATORY_CARE_PROVIDER_SITE_OTHER): Payer: 59

## 2014-09-06 ENCOUNTER — Ambulatory Visit (INDEPENDENT_AMBULATORY_CARE_PROVIDER_SITE_OTHER): Payer: 59 | Admitting: Podiatry

## 2014-09-06 VITALS — BP 126/86 | HR 78 | Resp 16

## 2014-09-06 DIAGNOSIS — M79673 Pain in unspecified foot: Secondary | ICD-10-CM

## 2014-09-06 DIAGNOSIS — M2012 Hallux valgus (acquired), left foot: Secondary | ICD-10-CM

## 2014-09-06 NOTE — Progress Notes (Signed)
Subjective:     Patient ID: Theresa Holloway, female   DOB: August 30, 1951, 63 y.o.   MRN: [id number]  HPI patient states I'm doing real well with my surgery on my right foot and walking well with no significant edema or swelling   Review of Systems     Objective:   Physical Exam Neurovascular status intact no other health history changes noted with wound edges that are well coapted on the first metatarsal right hallux and second digit. Patient is walking well with good alignment noted and has mild edema and no erythema drainage noted    Assessment:     Doing well post forefoot reconstruction right foot    Plan:     Reviewed x-rays and discussed continued immobilization and applied compression dressing to the foot and also applied dressing to the fourth toe that we fixed and also to the fifth metatarsal. Advised on continued immobilization elevation reappoint 10 days for suture removal earlier if any issues should occur

## 2014-09-16 ENCOUNTER — Encounter: Payer: Self-pay | Admitting: Podiatry

## 2014-09-16 ENCOUNTER — Ambulatory Visit (INDEPENDENT_AMBULATORY_CARE_PROVIDER_SITE_OTHER): Payer: 59 | Admitting: Podiatry

## 2014-09-16 VITALS — BP 95/50 | HR 80 | Resp 16

## 2014-09-16 DIAGNOSIS — M2012 Hallux valgus (acquired), left foot: Secondary | ICD-10-CM

## 2014-09-16 DIAGNOSIS — M21611 Bunion of right foot: Secondary | ICD-10-CM

## 2014-09-16 DIAGNOSIS — M2011 Hallux valgus (acquired), right foot: Secondary | ICD-10-CM

## 2014-09-16 DIAGNOSIS — M205X1 Other deformities of toe(s) (acquired), right foot: Secondary | ICD-10-CM

## 2014-09-16 NOTE — Progress Notes (Signed)
Subjective:     Patient ID: Theresa Holloway, female   DOB: Apr 29, 1951, 63 y.o.   MRN: [id number]  HPI patient presents for suture removal right foot stating she's feeling good   Review of Systems     Objective:   Physical Exam Neurovascular status intact with good alignment and stitches in place fourth digit right with wound edges coapted on the first and fifth metatarsal    Assessment:     Doing well post operative surgery with stitches in place fourth toe    Plan:     Removed stitches from the right fourth toe and instructed on wider-type shoes and gradual return to activity along with range of motion exercises for the first MPJ.

## 2014-09-30 ENCOUNTER — Ambulatory Visit (INDEPENDENT_AMBULATORY_CARE_PROVIDER_SITE_OTHER): Payer: 59

## 2014-09-30 ENCOUNTER — Ambulatory Visit (INDEPENDENT_AMBULATORY_CARE_PROVIDER_SITE_OTHER): Payer: 59 | Admitting: Podiatry

## 2014-09-30 VITALS — BP 114/74 | HR 76

## 2014-09-30 DIAGNOSIS — M2011 Hallux valgus (acquired), right foot: Secondary | ICD-10-CM

## 2014-09-30 DIAGNOSIS — M205X1 Other deformities of toe(s) (acquired), right foot: Secondary | ICD-10-CM

## 2014-10-03 NOTE — Progress Notes (Signed)
Subjective:     Patient ID: Theresa Holloway, female   DOB: [birth year], 63 y.o.   MRN: [id number]  HPI patient states I'm doing well with quite a bit of swelling in my fourth toe that I need to get checked but overall having minimal discomfort or swelling   Review of Systems     Objective:   Physical Exam Neurovascular status intact with approximate 8 weeks postop right foot with good structural correction good alignment of the big toe with range of motion that's excellent and mild swelling of the fourth toe right consistent with distal digital procedure    Assessment:     Doing well postoperatively with wound edges coapted negative Homans sign noted and well structured underlying bone    Plan:     H&P reviewed and today I went ahead and allow her to gradual return soft shoe and reviewed her x-rays. I do think the fourth toe swelling will stop but it's can be a gradual process and she will have to be patient. Reappoint to recheck 4 weeks earlier if any issues should occur

## 2014-10-28 ENCOUNTER — Ambulatory Visit (INDEPENDENT_AMBULATORY_CARE_PROVIDER_SITE_OTHER): Payer: 59

## 2014-10-28 ENCOUNTER — Encounter: Payer: Self-pay | Admitting: Podiatry

## 2014-10-28 ENCOUNTER — Ambulatory Visit (INDEPENDENT_AMBULATORY_CARE_PROVIDER_SITE_OTHER): Payer: 59 | Admitting: Podiatry

## 2014-10-28 VITALS — BP 94/78 | HR 81 | Resp 16

## 2014-10-28 DIAGNOSIS — M2011 Hallux valgus (acquired), right foot: Secondary | ICD-10-CM

## 2014-12-09 ENCOUNTER — Ambulatory Visit (INDEPENDENT_AMBULATORY_CARE_PROVIDER_SITE_OTHER): Payer: 59 | Admitting: Podiatry

## 2014-12-09 ENCOUNTER — Ambulatory Visit (INDEPENDENT_AMBULATORY_CARE_PROVIDER_SITE_OTHER): Payer: 59

## 2014-12-09 ENCOUNTER — Encounter: Payer: Self-pay | Admitting: Podiatry

## 2014-12-09 VITALS — BP 125/70 | HR 87 | Resp 16

## 2014-12-09 DIAGNOSIS — Z9889 Other specified postprocedural states: Secondary | ICD-10-CM

## 2014-12-09 DIAGNOSIS — M779 Enthesopathy, unspecified: Secondary | ICD-10-CM

## 2014-12-09 MED ORDER — TRIAMCINOLONE ACETONIDE 10 MG/ML IJ SUSP
10.0000 mg | Freq: Once | INTRAMUSCULAR | Status: AC
Start: 2014-12-09 — End: 2014-12-09
  Administered 2014-12-09: 10 mg

## 2014-12-09 MED ORDER — TRAMADOL HCL 50 MG PO TABS: 50.0000 mg | ORAL_TABLET | Freq: Three times a day (TID) | ORAL | Status: DC

## 2014-12-10 NOTE — Progress Notes (Signed)
Subjective:     Patient ID: Theresa Holloway, female   DOB: [birth year], 64 y.o.   MRN: [id number]  HPI patient states she's having a lot of pain in her right foot that had surgery on it several months ago. She does not point anywhere that we did the surgery but points to the top in the midtarsal joint stating that's been very sore   Review of Systems     Objective:   Physical Exam  Neurovascular status intact muscle strength was adequate with well-healed surgical site right first metatarsal fifth metatarsal fourth toe with good alignment and no discomfort in these areas with excellent range of motion of the first MPJ. Quite a bit of discomfort in the midtarsal joint right with mild deep but appears to be more inflammatory    Assessment:     Inflammatory tendinitis with possible mild arthritis of the midtarsal joint right    Plan:     Reviewed x-rays and today did careful dorsal injection 3 mg Kenalog 5 g Xylocaine and advised on heat and ice therapy. If symptoms persist she will reappoint

## 2014-12-30 ENCOUNTER — Ambulatory Visit (INDEPENDENT_AMBULATORY_CARE_PROVIDER_SITE_OTHER): Payer: 59 | Admitting: Podiatry

## 2014-12-30 ENCOUNTER — Encounter: Payer: Self-pay | Admitting: Podiatry

## 2014-12-30 DIAGNOSIS — M779 Enthesopathy, unspecified: Secondary | ICD-10-CM

## 2014-12-30 DIAGNOSIS — Z9889 Other specified postprocedural states: Secondary | ICD-10-CM

## 2014-12-30 DIAGNOSIS — R609 Edema, unspecified: Secondary | ICD-10-CM

## 2014-12-30 MED ORDER — TRIAMCINOLONE ACETONIDE 10 MG/ML IJ SUSP
10.0000 mg | Freq: Once | INTRAMUSCULAR | Status: AC
  Administered 2014-12-30: 10 mg

## 2015-01-02 NOTE — Progress Notes (Signed)
Subjective:     Patient ID: Theresa Holloway, female   DOB: 1951-06-03, 64 y.o.   MRN: [id number]  HPI patient states I'm doing a little better but I'm still having some pain on the outside of my foot and I feel like my foot is still swelling after the surgery   Review of Systems     Objective:   Physical Exam Neurovascular status intact no change in health history with discomfort on the lateral side of the right foot slightly lateral to where it was previous visit with inflammation around the midtarsal joint. Incision sites himself of healed beautifully of the range of motion of the first MPJ is excellent right with no crepitus    Assessment:     Tendinitis right with edema of a mild nature present with no indications of chronic pain syndrome and well-healing surgical sites    Plan:     Reviewed conditions and at this time careful injection in a slightly different area to reduce inflammation and instructed on warm therapy toward all treatment and that this should gradually get better. Reappoint in the next 4-6 weeks

## 2015-02-24 ENCOUNTER — Ambulatory Visit: Payer: Self-pay

## 2015-02-24 ENCOUNTER — Ambulatory Visit (INDEPENDENT_AMBULATORY_CARE_PROVIDER_SITE_OTHER): Payer: 59 | Admitting: Podiatry

## 2015-02-24 ENCOUNTER — Encounter: Payer: Self-pay | Admitting: Podiatry

## 2015-02-24 VITALS — BP 120/84 | HR 80 | Resp 15

## 2015-02-24 DIAGNOSIS — G518 Other disorders of facial nerve: Secondary | ICD-10-CM

## 2015-02-24 DIAGNOSIS — M779 Enthesopathy, unspecified: Secondary | ICD-10-CM

## 2015-02-24 DIAGNOSIS — Z9889 Other specified postprocedural states: Secondary | ICD-10-CM

## 2015-02-24 DIAGNOSIS — G500 Trigeminal neuralgia: Secondary | ICD-10-CM

## 2015-02-24 MED ORDER — GABAPENTIN 100 MG PO CAPS: 100.0000 mg | ORAL_CAPSULE | Freq: Two times a day (BID) | ORAL | Status: DC

## 2015-02-27 NOTE — Progress Notes (Signed)
Subjective:     Patient ID: Theresa Holloway, female   DOB: Oct 28, 1950, 64 y.o.   MRN: [id number]  HPI patient was concerned because the top of the right foot was turning red previously and while it's not today she states there is times that it is and she wants to make sure she does not have infection or other issue   Review of Systems     Objective:   Physical Exam Neurovascular status intact with patient not complaining about surgical sites that were done several months ago on the right forefoot. She is complaining of midfoot pain dorsal with what she states his color changes which are sometimes present and sometimes not    Assessment:     Cannot rule out there may be a low grade pain syndrome here versus arthritic issues of the midfoot right. Surgical sites look good with swelling continuing to reduce around the first fifth metatarsals and fourth toe    Plan:     H&P and x-rays reviewed with patient. At this point we'll start her on low-dose gabapentin and also topical combination creating to try to reduce inflammation and possible nerve irritation. She'll be reviewed again in 4 weeks after these medicines are able to start

## 2015-02-28 ENCOUNTER — Telehealth: Payer: Self-pay | Admitting: *Deleted

## 2015-02-28 NOTE — Telephone Encounter (Addendum)
Theresa Holloway states the medication prescribed by Dr. Paulla Dolly is not covered by pt's insurance, but there is a formulation that is covered.  I asked Theresa Holloway to fax the new recommended formulation to present to Dr. Paulla Dolly.  Theresa Holloway [facility] faxed the change in medication formulation to Theresa Holloway on 02/28/2015.

## 2015-03-24 ENCOUNTER — Other Ambulatory Visit: Payer: Self-pay | Admitting: Podiatry

## 2015-03-24 ENCOUNTER — Encounter: Payer: Self-pay | Admitting: Podiatry

## 2015-03-24 ENCOUNTER — Ambulatory Visit (INDEPENDENT_AMBULATORY_CARE_PROVIDER_SITE_OTHER): Payer: 59 | Admitting: Podiatry

## 2015-03-24 ENCOUNTER — Ambulatory Visit (INDEPENDENT_AMBULATORY_CARE_PROVIDER_SITE_OTHER): Payer: 59

## 2015-03-24 VITALS — BP 115/65 | HR 81 | Resp 18

## 2015-03-24 DIAGNOSIS — R52 Pain, unspecified: Secondary | ICD-10-CM

## 2015-03-24 DIAGNOSIS — M779 Enthesopathy, unspecified: Secondary | ICD-10-CM

## 2015-03-24 MED ORDER — TRIAMCINOLONE ACETONIDE 10 MG/ML IJ SUSP
10.0000 mg | Freq: Once | INTRAMUSCULAR | Status: AC
  Administered 2015-03-24: 10 mg

## 2015-03-24 NOTE — Progress Notes (Signed)
Subjective:     Patient ID: Theresa Holloway, female   DOB: Apr 12, 1951, 64 y.o.   MRN: [id number]  HPI patient presents stating the area that he worked on last time it is doing very well but I developed inflammation that's been very tender between my third toe in my metatarsal bone. Just over the last 10 days with no history of injury   Review of Systems     Objective:   Physical Exam Neurovascular status intact muscle strength adequate with inflammation and pain third metatarsophalangeal joint right    Assessment:     Appears to be acute inflammatory capsulitis third MPJ right with no indication of proximal edema or pain    Plan:     Reviewed x-rays and H&P and did a proximal nerve block aspirated the joint getting out a small amount of clear fluid and injected with half cc of dexamethasone Kenalog. Then applied plantar pad to reduce stress on the joint and reappoint in 2 weeks

## 2015-03-28 ENCOUNTER — Ambulatory Visit: Payer: 59 | Admitting: Podiatry

## 2015-04-21 ENCOUNTER — Other Ambulatory Visit: Payer: Self-pay

## 2015-04-21 DIAGNOSIS — Z1231 Encounter for screening mammogram for malignant neoplasm of breast: Secondary | ICD-10-CM

## 2015-05-19 ENCOUNTER — Ambulatory Visit
Admission: RE | Admit: 2015-05-19 | Discharge: 2015-05-19 | Disposition: A | Discharge status: Home / Self Care | Payer: 59 | Source: Ambulatory Visit

## 2015-05-19 DIAGNOSIS — Z1231 Encounter for screening mammogram for malignant neoplasm of breast: Secondary | ICD-10-CM

## 2015-07-15 NOTE — Progress Notes (Signed)
   Subjective:    Patient ID: Theresa Holloway, female    DOB: 06-15-1951, 64 y.o.   MRN: [id number]  HPI    Review of Systems     Objective:   Physical Exam        Assessment & Plan:

## 2015-07-27 ENCOUNTER — Telehealth: Payer: Self-pay | Admitting: *Deleted

## 2015-07-28 NOTE — Telephone Encounter (Signed)
Entered in error

## 2016-05-22 ENCOUNTER — Other Ambulatory Visit: Payer: Self-pay | Admitting: Family Medicine

## 2016-05-22 DIAGNOSIS — Z1231 Encounter for screening mammogram for malignant neoplasm of breast: Secondary | ICD-10-CM

## 2016-06-04 ENCOUNTER — Ambulatory Visit
Admission: RE | Admit: 2016-06-04 | Discharge: 2016-06-04 | Disposition: A | Discharge status: Home / Self Care | Payer: 59 | Source: Ambulatory Visit | Attending: Family Medicine | Admitting: Family Medicine

## 2016-06-04 DIAGNOSIS — Z1231 Encounter for screening mammogram for malignant neoplasm of breast: Secondary | ICD-10-CM

## 2016-12-11 ENCOUNTER — Other Ambulatory Visit: Payer: Self-pay | Admitting: Dermatology

## 2017-02-11 ENCOUNTER — Other Ambulatory Visit (HOSPITAL_COMMUNITY): Payer: Self-pay | Admitting: Family Medicine

## 2017-02-11 ENCOUNTER — Ambulatory Visit (HOSPITAL_COMMUNITY)
Admission: RE | Admit: 2017-02-11 | Discharge: 2017-02-11 | Disposition: A | Discharge status: Home / Self Care | Payer: 59 | Source: Ambulatory Visit | Attending: Family Medicine | Admitting: Family Medicine

## 2017-02-11 DIAGNOSIS — J069 Acute upper respiratory infection, unspecified: Secondary | ICD-10-CM | POA: Diagnosis present

## 2017-02-11 DIAGNOSIS — J209 Acute bronchitis, unspecified: Secondary | ICD-10-CM | POA: Diagnosis not present

## 2017-02-19 ENCOUNTER — Telehealth: Payer: Self-pay | Admitting: Internal Medicine

## 2017-02-19 NOTE — Telephone Encounter (Signed)
Received records from [facility] for appointment on 02/28/17 with Dr Debara Pickett.  Records put with Dr Lysbeth Penner schedule for 02/28/17. lp

## 2017-02-28 ENCOUNTER — Ambulatory Visit (INDEPENDENT_AMBULATORY_CARE_PROVIDER_SITE_OTHER): Payer: 59 | Admitting: Internal Medicine

## 2017-02-28 ENCOUNTER — Encounter: Payer: Self-pay | Admitting: Internal Medicine

## 2017-02-28 VITALS — BP 116/72 | HR 80 | Ht 66.0 in | Wt 127.2 lb

## 2017-02-28 DIAGNOSIS — R0602 Shortness of breath: Secondary | ICD-10-CM | POA: Diagnosis not present

## 2017-02-28 DIAGNOSIS — R6889 Other general symptoms and signs: Secondary | ICD-10-CM | POA: Diagnosis not present

## 2017-02-28 DIAGNOSIS — R5383 Other fatigue: Secondary | ICD-10-CM

## 2017-02-28 DIAGNOSIS — R0789 Other chest pain: Secondary | ICD-10-CM | POA: Diagnosis not present

## 2017-02-28 DIAGNOSIS — R209 Unspecified disturbances of skin sensation: Secondary | ICD-10-CM | POA: Insufficient documentation

## 2017-02-28 NOTE — Progress Notes (Signed)
OFFICE CONSULT NOTE  Chief Complaint:  Cold fingers and toes, fatigue, chest pressure  Primary Care Physician: Sharilyn Sites, MD  HPI:  Theresa Holloway is a 66 y.o. female who is being seen today for the evaluation of cold fingers and toes, fatigue and chest pressure at the request of Sharilyn Sites, MD. Mrs. Huizenga has a history of asthma and recent sinus infections with pneumonia, hypothyroidism, GERD and kidney cancer in the past, status post hysterectomy, back surgery, nephrectomy and elbow surgery. She presents today for evaluation of fatigue, cold fingers and toes and chest pressure which has been going on for quite a while. She recently had a pneumonia which is cleared. She is also concerned of her family history his father died at age 49 of massive heart attack and mother died at age 56 with COPD/congestive heart failure. She reports infrequent pressure in the chest, mostly while sitting at her desk at work or after leaving work. She reports being in a very stressful job and works at [employer]. She does not note any exercise intolerance. She has also felt fatigue. She says doing certain activities like gardening for example she noted to be extremely fatigued after that. But when I inquired about it she was only planting a few flowers, not necessarily moving arms of mulch or lifting heavy boulders. In addition she notes discoloration of her fingers and toes. She says is not necessarily worse or painful and cold weather but her fingers are cold. She gets some numbness and tingling in her toes. She denies any symptoms of claudication.  PMHx:  Past Medical History:  Diagnosis Date  . Asthma   . FHx: gallbladder disease   . H/O: hysterectomy   . Headache(784.0) 01/29/2013  . History of back surgery   . History of kidney cancer   . Insomnia 01/29/2013  . Osteoporosis   . Postconcussion syndrome 01/29/2013  . Right ankle injury   . Sinus tarsi syndrome     Past Surgical History:    Procedure Laterality Date  . ABDOMINAL HYSTERECTOMY    . BACK SURGERY    . ELBOW SURGERY    . NEPHRECTOMY      FAMHx:  Family History  Problem Relation Age of Onset  . Heart failure Mother   . COPD Mother   . Heart attack Father     SOCHx:   reports that she has never smoked. She has never used smokeless tobacco. She reports that she does not drink alcohol or use drugs.  ALLERGIES:  Allergies  Allergen Reactions  . Codeine   . Morphine And Related   . Other     Cat gut suture  . Penicillins   . Prednisone     Excessive weight gain - 35lbs in 10 days  . Sulfa Antibiotics     ROS: Pertinent items noted in HPI and remainder of comprehensive ROS otherwise negative.  HOME MEDS: Current Outpatient Prescriptions on File Prior to Visit  Medication Sig Dispense Refill  . ALPRAZolam (XANAX) 1 MG tablet Take 1 mg by mouth at bedtime.     . cetirizine (ZYRTEC) 10 MG tablet Take 10 mg by mouth daily.    Marland Kitchen estradiol (ESTRACE) 1 MG tablet Take 1 mg by mouth daily.    . fluticasone (FLONASE) 50 MCG/ACT nasal spray Place 1 spray into both nostrils daily.    . furosemide (LASIX) 40 MG tablet Take 20 mg by mouth daily as needed.     Marland Kitchen levothyroxine (  SYNTHROID, LEVOTHROID) 50 MCG tablet Take 50 mcg by mouth daily.  1  . pantoprazole (PROTONIX) 40 MG tablet Take 40 mg by mouth daily.     No current facility-administered medications on file prior to visit.     LABS/IMAGING: No results found for this or any previous visit (from the past 48 hour(s)). No results found.  LIPID PANEL: No results found for: CHOL, TRIG, HDL, CHOLHDL, VLDL, LDLCALC, LDLDIRECT  WEIGHTS: Wt Readings from Last 3 Encounters:  02/28/17 127 lb 3.2 oz (57.7 kg)  01/29/13 149 lb (67.6 kg)    VITALS: BP 116/72 (BP Location: Right Arm, Patient Position: Sitting, Cuff Size: Normal)   Pulse 80   Ht 5\' 6"  (1.676 m)   Wt 127 lb 3.2 oz (57.7 kg)   BMI 20.53 kg/m   EXAM: General appearance: alert, no  distress and Thin Neck: no carotid bruit and no JVD Lungs: clear to auscultation bilaterally Heart: regular rate and rhythm Abdomen: soft, non-tender; bowel sounds normal; no masses,  no organomegaly Extremities: Bluish discoloration of the fingers and toes, delayed capillary refill, cool extremities Pulses: 2+ radial pulses, faint to 1+ DP/PT pulses bilaterally Skin: Skin color, texture, turgor normal. No rashes or lesions Neurologic: Grossly normal Psych: Pleasant  EKG: Normal sinus rhythm at 80 - personally reviewed  ASSESSMENT: 1. Atypical chest pressure, dyspnea 2. Fatigue 3. Recent pneumonia 4. Cool extremities  PLAN: 1.   I suspect her chest pressure is noncardiac. She has few risk factors other than his family history, but does report shortness of breath and fatigue which is fairly new. She did have a recent pneumonia but that's resolved. It could be underlying pulmonary disease although she was a remote and very light smoker. I recommended exercise stress echocardiogram to evaluate for ischemia. In addition will check ABIs due to her cool extremities to rule out any arterial insufficiency. Some of these symptoms may be related to Raynaud's or possibly peripheral neuropathy. Needless to say she is asymptomatic with it.  Thanks for the consultation. Plan follow-up to review the studies in a few weeks.  Pixie Casino, MD, [place]  Attending Cardiologist  Direct Dial: [phone]  Fax: [phone]  Website:  [email or web address] 02/28/2017, 2:52 PM

## 2017-02-28 NOTE — Patient Instructions (Signed)
Medication Instructions:  Your physician recommends that you continue on your current medications as directed. Please refer to the Current Medication list given to you today.  Labwork: NONE  Testing/Procedures: Your physician has requested that you have an echocardiogram. Echocardiography is a painless test that uses sound waves to create images of your heart. It provides your doctor with information about the size and shape of your heart and how well your heart's chambers and valves are working. This procedure takes approximately one hour. There are no restrictions for this procedure. STRESS ECHO [facility] [place] [address]  Your physician has requested that you have an ankle brachial index (ABI). During this test an ultrasound and blood pressure cuff are used to evaluate the arteries that supply the arms and legs with blood. Allow thirty minutes for this exam. There are no restrictions or special instructions.  Follow-Up: Your physician recommends that you schedule a follow-up appointment in: AFTER STUDIES   Any Other Special Instructions Will Be Listed Below (If Applicable).     If you need a refill on your cardiac medications before your next appointment, please call your pharmacy.

## 2017-03-07 ENCOUNTER — Ambulatory Visit (INDEPENDENT_AMBULATORY_CARE_PROVIDER_SITE_OTHER): Payer: 59 | Admitting: Otolaryngology

## 2017-03-07 DIAGNOSIS — J343 Hypertrophy of nasal turbinates: Secondary | ICD-10-CM

## 2017-03-07 DIAGNOSIS — H6983 Other specified disorders of Eustachian tube, bilateral: Secondary | ICD-10-CM

## 2017-03-07 DIAGNOSIS — H903 Sensorineural hearing loss, bilateral: Secondary | ICD-10-CM | POA: Diagnosis not present

## 2017-03-07 DIAGNOSIS — J342 Deviated nasal septum: Secondary | ICD-10-CM | POA: Diagnosis not present

## 2017-03-12 ENCOUNTER — Other Ambulatory Visit (INDEPENDENT_AMBULATORY_CARE_PROVIDER_SITE_OTHER): Payer: Self-pay | Admitting: Otolaryngology

## 2017-03-12 DIAGNOSIS — J328 Other chronic sinusitis: Secondary | ICD-10-CM

## 2017-03-14 ENCOUNTER — Telehealth (HOSPITAL_COMMUNITY): Payer: Self-pay | Admitting: *Deleted

## 2017-03-14 NOTE — Telephone Encounter (Signed)
Patient given detailed instructions per Stress Test Requisition Sheet for test on 03/21/17 at 2:30.Patient Notified to arrive 30 minutes early, and that it is imperative to arrive on time for appointment to keep from having the test rescheduled.  Patient verbalized understanding. Veronia Beets

## 2017-03-18 ENCOUNTER — Ambulatory Visit (HOSPITAL_COMMUNITY)
Admission: RE | Admit: 2017-03-18 | Discharge: 2017-03-18 | Disposition: A | Discharge status: Home / Self Care | Payer: 59 | Source: Ambulatory Visit | Attending: Cardiovascular Disease | Admitting: Cardiovascular Disease

## 2017-03-18 DIAGNOSIS — R6889 Other general symptoms and signs: Secondary | ICD-10-CM | POA: Diagnosis not present

## 2017-03-18 DIAGNOSIS — R209 Unspecified disturbances of skin sensation: Secondary | ICD-10-CM

## 2017-03-18 DIAGNOSIS — Z87891 Personal history of nicotine dependence: Secondary | ICD-10-CM | POA: Diagnosis not present

## 2017-03-21 ENCOUNTER — Other Ambulatory Visit (HOSPITAL_COMMUNITY): Payer: 59

## 2017-03-21 ENCOUNTER — Ambulatory Visit (HOSPITAL_BASED_OUTPATIENT_CLINIC_OR_DEPARTMENT_OTHER): Payer: 59

## 2017-03-21 ENCOUNTER — Ambulatory Visit (HOSPITAL_COMMUNITY): Payer: 59 | Attending: Internal Medicine

## 2017-03-21 DIAGNOSIS — R5383 Other fatigue: Secondary | ICD-10-CM | POA: Diagnosis not present

## 2017-03-21 DIAGNOSIS — R0602 Shortness of breath: Secondary | ICD-10-CM | POA: Insufficient documentation

## 2017-03-21 DIAGNOSIS — R0989 Other specified symptoms and signs involving the circulatory and respiratory systems: Secondary | ICD-10-CM

## 2017-03-21 DIAGNOSIS — R0789 Other chest pain: Secondary | ICD-10-CM

## 2017-03-22 ENCOUNTER — Telehealth: Payer: Self-pay | Admitting: Internal Medicine

## 2017-03-22 DIAGNOSIS — I209 Angina pectoris, unspecified: Secondary | ICD-10-CM

## 2017-03-22 HISTORY — DX: Angina pectoris, unspecified: I20.9

## 2017-03-22 NOTE — Telephone Encounter (Signed)
Notes recorded by Sanda Klein, MD on 03/21/2017 at 1:24 PM EDT No sign of blockages in the lower extremities  Patient aware of results.

## 2017-03-22 NOTE — Telephone Encounter (Signed)
New message ° ° ° ° ° °Returning a call to the nurse to get test results °

## 2017-03-26 ENCOUNTER — Ambulatory Visit (HOSPITAL_COMMUNITY)
Admission: RE | Admit: 2017-03-26 | Discharge: 2017-03-26 | Disposition: A | Discharge status: Home / Self Care | Payer: 59 | Source: Ambulatory Visit | Attending: Otolaryngology | Admitting: Otolaryngology

## 2017-03-26 DIAGNOSIS — J328 Other chronic sinusitis: Secondary | ICD-10-CM | POA: Insufficient documentation

## 2017-04-04 ENCOUNTER — Ambulatory Visit (INDEPENDENT_AMBULATORY_CARE_PROVIDER_SITE_OTHER): Payer: 59 | Admitting: Otolaryngology

## 2017-04-04 DIAGNOSIS — R51 Headache: Secondary | ICD-10-CM

## 2017-04-04 DIAGNOSIS — J343 Hypertrophy of nasal turbinates: Secondary | ICD-10-CM | POA: Diagnosis not present

## 2017-04-04 DIAGNOSIS — J310 Chronic rhinitis: Secondary | ICD-10-CM

## 2017-04-04 DIAGNOSIS — J330 Polyp of nasal cavity: Secondary | ICD-10-CM

## 2017-04-08 ENCOUNTER — Ambulatory Visit (INDEPENDENT_AMBULATORY_CARE_PROVIDER_SITE_OTHER): Payer: 59 | Admitting: Internal Medicine

## 2017-04-08 ENCOUNTER — Encounter: Payer: Self-pay | Admitting: Internal Medicine

## 2017-04-08 VITALS — BP 114/61 | HR 93 | Ht 66.0 in | Wt 125.2 lb

## 2017-04-08 DIAGNOSIS — R0789 Other chest pain: Secondary | ICD-10-CM | POA: Diagnosis not present

## 2017-04-08 DIAGNOSIS — R209 Unspecified disturbances of skin sensation: Secondary | ICD-10-CM

## 2017-04-08 DIAGNOSIS — R6889 Other general symptoms and signs: Secondary | ICD-10-CM

## 2017-04-08 DIAGNOSIS — R5383 Other fatigue: Secondary | ICD-10-CM

## 2017-04-08 DIAGNOSIS — R0602 Shortness of breath: Secondary | ICD-10-CM | POA: Diagnosis not present

## 2017-04-08 NOTE — Progress Notes (Signed)
OFFICE CONSULT NOTE  Chief Complaint:  Follow-up studies  Primary Care Physician: Sharilyn Sites, MD  HPI:  Theresa Holloway is a 66 y.o. female who is being seen today for the evaluation of cold fingers and toes, fatigue and chest pressure at the request of Sharilyn Sites, MD. Theresa Holloway has a history of asthma and recent sinus infections with pneumonia, hypothyroidism, GERD and kidney cancer in the past, status post hysterectomy, back surgery, nephrectomy and elbow surgery. She presents today for evaluation of fatigue, cold fingers and toes and chest pressure which has been going on for quite a while. She recently had a pneumonia which is cleared. She is also concerned of her family history his father died at age 68 of massive heart attack and mother died at age 34 with COPD/congestive heart failure. She reports infrequent pressure in the chest, mostly while sitting at her desk at work or after leaving work. She reports being in a very stressful job and works at [employer]. She does not note any exercise intolerance. She has also felt fatigue. She says doing certain activities like gardening for example she noted to be extremely fatigued after that. But when I inquired about it she was only planting a few flowers, not necessarily moving arms of mulch or lifting heavy boulders. In addition she notes discoloration of her fingers and toes. She says is not necessarily worse or painful and cold weather but her fingers are cold. She gets some numbness and tingling in her toes. She denies any symptoms of claudication.  04/08/2017  Theresa Holloway returns today for follow-up. She is accompanied by her friend. I reviewed the results of her studies today which indicate a normal stress echocardiogram. Given her history of smoking in the past and presentation with concerning cool extremities, I recommended lower extremity arterial Dopplers. Those studies were normal. She had does have history of COPD, but only uses  albuterol inhaler which she says she use a few times last month. She has noted some faster heart rates at times. She also gets some discomfort in her chest which is worse after eating. She thinks it may be reflux and is concerned that her current reflux medication is not working properly.  PMHx:  Past Medical History:  Diagnosis Date  . Asthma   . FHx: gallbladder disease   . H/O: hysterectomy   . Headache(784.0) 01/29/2013  . History of back surgery   . History of kidney cancer   . Insomnia 01/29/2013  . Osteoporosis   . Postconcussion syndrome 01/29/2013  . Right ankle injury   . Sinus tarsi syndrome     Past Surgical History:  Procedure Laterality Date  . ABDOMINAL HYSTERECTOMY    . BACK SURGERY    . ELBOW SURGERY    . NEPHRECTOMY      FAMHx:  Family History  Problem Relation Age of Onset  . Heart failure Mother   . COPD Mother   . Heart attack Father     SOCHx:   reports that she has never smoked. She has never used smokeless tobacco. She reports that she does not drink alcohol or use drugs.  ALLERGIES:  Allergies  Allergen Reactions  . Codeine   . Morphine And Related   . Other     Cat gut suture  . Penicillins   . Prednisone     Excessive weight gain - 35lbs in 10 days  . Sulfa Antibiotics     ROS: Pertinent items noted in  HPI and remainder of comprehensive ROS otherwise negative.  HOME MEDS: Current Outpatient Prescriptions on File Prior to Visit  Medication Sig Dispense Refill  . ALPRAZolam (XANAX) 1 MG tablet Take 1 mg by mouth at bedtime.     Marland Kitchen Apremilast (OTEZLA) 30 MG TABS Take 1 tablet by mouth daily.    Marland Kitchen aspirin EC 81 MG tablet Take 81 mg by mouth daily.    . Calcium Carb-Cholecalciferol (CALCIUM-VITAMIN D) 600-400 MG-UNIT TABS Take 1 tablet by mouth daily.    . cetirizine (ZYRTEC) 10 MG tablet Take 10 mg by mouth daily.    Marland Kitchen estradiol (ESTRACE) 1 MG tablet Take 1 mg by mouth daily.    . fluticasone (FLONASE) 50 MCG/ACT nasal spray Place 1  spray into both nostrils daily.    . furosemide (LASIX) 40 MG tablet Take 20 mg by mouth daily as needed.     Marland Kitchen levothyroxine (SYNTHROID, LEVOTHROID) 50 MCG tablet Take 50 mcg by mouth daily.  1  . pantoprazole (PROTONIX) 40 MG tablet Take 40 mg by mouth daily.    . Probiotic Product (TRUBIOTICS) CAPS Take 1 capsule by mouth daily.     No current facility-administered medications on file prior to visit.     LABS/IMAGING: No results found for this or any previous visit (from the past 48 hour(s)). No results found.  LIPID PANEL: No results found for: CHOL, TRIG, HDL, CHOLHDL, VLDL, LDLCALC, LDLDIRECT  WEIGHTS: Wt Readings from Last 3 Encounters:  04/08/17 125 lb 3.2 oz (56.8 kg)  02/28/17 127 lb 3.2 oz (57.7 kg)  01/29/13 149 lb (67.6 kg)    VITALS: BP 114/61   Pulse 93   Ht 5\' 6"  (1.676 m)   Wt 125 lb 3.2 oz (56.8 kg)   BMI 20.21 kg/m   EXAM: Deferred  EKG: Deferred  ASSESSMENT: 1. Atypical chest pressure, dyspnea 2. Fatigue 3. Recent pneumonia 4. Cool extremities  PLAN: 1.   Theresa Holloway is stress echocardiogram was reassuring and did not suggest a cardiac cause of her chest pressure and dyspnea. Her cool extremities are not related to arterial insufficiency as she had normal lower extremity arterial Dopplers. I suspect some of her chest pressure and dyspnea may be related to COPD. She is not on a long-acting bronchodilator or inhaled corticosteroid I encouraged her to have further Promus function testing and she may need a pulmonary referral. Finally, she may be having some worsening reflux symptoms. I mentioned she could take an H2 blocker on top of her PPI to see if this provides additional relief.  Follow-up with me as needed.  Pixie Casino, MD, [place]  Attending Cardiologist  Direct Dial: [phone]  Fax: [phone]  Website:  [email or web address] 04/08/2017, 4:52 PM

## 2017-04-08 NOTE — Patient Instructions (Signed)
Your physician recommends that you schedule a follow-up appointment as needed  

## 2017-05-03 ENCOUNTER — Other Ambulatory Visit: Payer: Self-pay | Admitting: Family Medicine

## 2017-05-03 DIAGNOSIS — Z1231 Encounter for screening mammogram for malignant neoplasm of breast: Secondary | ICD-10-CM

## 2017-05-08 ENCOUNTER — Encounter: Payer: Self-pay | Admitting: Podiatry

## 2017-05-08 ENCOUNTER — Ambulatory Visit: Payer: 59

## 2017-05-08 ENCOUNTER — Ambulatory Visit (INDEPENDENT_AMBULATORY_CARE_PROVIDER_SITE_OTHER): Payer: 59 | Admitting: Podiatry

## 2017-05-08 DIAGNOSIS — M79671 Pain in right foot: Secondary | ICD-10-CM

## 2017-05-08 DIAGNOSIS — M79672 Pain in left foot: Principal | ICD-10-CM

## 2017-05-08 DIAGNOSIS — M779 Enthesopathy, unspecified: Secondary | ICD-10-CM

## 2017-05-08 DIAGNOSIS — L84 Corns and callosities: Secondary | ICD-10-CM

## 2017-05-08 MED ORDER — TRIAMCINOLONE ACETONIDE 10 MG/ML IJ SUSP
10.0000 mg | Freq: Once | INTRAMUSCULAR | Status: AC
  Administered 2017-05-08: 10 mg

## 2017-05-09 NOTE — Progress Notes (Signed)
Subjective:    Patient ID: Theresa Holloway, female   DOB: 66 y.o.   MRN: [id number]   HPI patient presents with inflammation and pain around the fifth metatarsal head of both feet with fluid buildup and keratotic lesion formation    ROS      Objective:  Physical Exam neurovascular status intact with inflammatory capsulitis fifth MPJ bilateral with fluid buildup and lesion formation     Assessment:    Inflammatory capsulitis fifth MPJ bilateral with lesion formation     Plan:    H&P reviewed Taylor's bunion deformity and did careful injections of the fifth MPJ bilateral into the plantar capsule with 2 mg Dexon some Kenalog 5 mill grams Xylocaine and debrided lesions fully. Reappoint as needed and may ultimately require osteotomy  X-rays indicate there is moderate hypertrophy around the fifth metatarsal bilateral

## 2017-06-07 ENCOUNTER — Ambulatory Visit: Payer: 59

## 2017-06-14 ENCOUNTER — Ambulatory Visit
Admission: RE | Admit: 2017-06-14 | Discharge: 2017-06-14 | Disposition: A | Discharge status: Home / Self Care | Payer: 59 | Source: Ambulatory Visit | Attending: Family Medicine | Admitting: Family Medicine

## 2017-06-14 DIAGNOSIS — Z1231 Encounter for screening mammogram for malignant neoplasm of breast: Secondary | ICD-10-CM

## 2017-10-15 DIAGNOSIS — J189 Pneumonia, unspecified organism: Secondary | ICD-10-CM

## 2017-10-15 HISTORY — DX: Pneumonia, unspecified organism: J18.9

## 2018-01-13 ENCOUNTER — Encounter (INDEPENDENT_AMBULATORY_CARE_PROVIDER_SITE_OTHER): Payer: Self-pay

## 2018-01-13 ENCOUNTER — Ambulatory Visit (INDEPENDENT_AMBULATORY_CARE_PROVIDER_SITE_OTHER): Payer: 59 | Admitting: Adult Health

## 2018-01-13 ENCOUNTER — Other Ambulatory Visit: Payer: Self-pay

## 2018-01-13 ENCOUNTER — Encounter: Payer: Self-pay | Admitting: Adult Health

## 2018-01-13 VITALS — BP 132/70 | HR 81 | Resp 18 | Ht 66.75 in | Wt 135.0 lb

## 2018-01-13 DIAGNOSIS — E041 Nontoxic single thyroid nodule: Secondary | ICD-10-CM | POA: Insufficient documentation

## 2018-01-13 DIAGNOSIS — Z1211 Encounter for screening for malignant neoplasm of colon: Secondary | ICD-10-CM | POA: Diagnosis not present

## 2018-01-13 DIAGNOSIS — Z01411 Encounter for gynecological examination (general) (routine) with abnormal findings: Secondary | ICD-10-CM

## 2018-01-13 DIAGNOSIS — N816 Rectocele: Secondary | ICD-10-CM

## 2018-01-13 DIAGNOSIS — Z1212 Encounter for screening for malignant neoplasm of rectum: Secondary | ICD-10-CM

## 2018-01-13 DIAGNOSIS — R635 Abnormal weight gain: Secondary | ICD-10-CM | POA: Diagnosis not present

## 2018-01-13 DIAGNOSIS — Z79818 Long term (current) use of other agents affecting estrogen receptors and estrogen levels: Secondary | ICD-10-CM | POA: Insufficient documentation

## 2018-01-13 DIAGNOSIS — Z79899 Other long term (current) drug therapy: Secondary | ICD-10-CM | POA: Diagnosis not present

## 2018-01-13 DIAGNOSIS — R232 Flushing: Secondary | ICD-10-CM | POA: Insufficient documentation

## 2018-01-13 DIAGNOSIS — Z01419 Encounter for gynecological examination (general) (routine) without abnormal findings: Secondary | ICD-10-CM | POA: Insufficient documentation

## 2018-01-13 LAB — HEMOCCULT GUIAC POC 1CARD (OFFICE): Fecal Occult Blood, POC: NEGATIVE

## 2018-01-13 MED ORDER — ESTRADIOL 1 MG PO TABS: 1.0000 mg | ORAL_TABLET | Freq: Every day | ORAL | 2 refills | Status: DC

## 2018-01-13 NOTE — Progress Notes (Signed)
Patient ID: [name], female   DOB: Jun 28, 1951, 67 y.o.   MRN: [id number] History of Present Illness: Theresa "Theresa Holloway" is a 67 year old white female, divorced in for well woman gyn exam.She is still working for [employer].  PCP is Dr Theresa Holloway.    Current Medications, Allergies, Past Medical History, Past Surgical History, Family History and Social History were reviewed in [employer] record.     Review of Systems: Patient denies any headaches, hearing loss, fatigue, blurred vision, shortness of breath, chest pain, abdominal pain, problems with bowel movements, urination, or intercourse. No joint pain or mood swings. +weight gain and +hot flashes, since running out of ET   Physical Exam:BP 132/70 (BP Location: Right Arm, Patient Position: Sitting, Cuff Size: Normal)   Pulse 81   Resp 18   Ht 5' 6.75" (1.695 m)   Wt 135 lb (61.2 kg)   BMI 21.30 kg/m  General:  Well developed, well nourished, no acute distress Skin:  Warm and dry Neck:  Midline trachea, right thyroid nodule, good ROM, no lymphadenopathy,no carotid bruits heard Lungs; Clear to auscultation bilaterally Breast:  No dominant palpable mass, retraction, or nipple discharge Cardiovascular: Regular rate and rhythm Abdomen:  Soft, non tender, no hepatosplenomegaly Pelvic:  External genitalia is normal in appearance, no lesions.  The vagina is normal in appearance. Urethra has no lesions or masses. The cervix and uterus are absent. No adnexal masses or tenderness noted.Bladder is non tender, no masses felt. Rectal: Good sphincter tone, no polyps, or hemorrhoids felt.  Hemoccult negative.+rectocele Extremities/musculoskeletal:  No swelling or varicosities noted, no clubbing or cyanosis Psych:  No mood changes, alert and cooperative,seems happy PHQ 2 score 0.Explained rectocele, will get [place] to to assess thyroid.   Impression: 1. Encounter for gynecological examination   2. Screening for colorectal cancer    3. Thyroid nodule   4. Hot flashes   5. Weight gain   6. Current use of estrogen therapy   7. Rectocele       Plan: Meds ordered this encounter  Medications  . estradiol (ESTRACE) 1 MG tablet    Sig: Take 1 tablet (1 mg total) by mouth daily.    Dispense:  90 tablet    Refill:  2    Order Specific Question:   Supervising Provider    Answer:   Tania Ade H [[id number]]  Check TSH and free T4 Thyroid US 4/5 at 12:30 pm at [facility] in 1 year Mammogram yearly Colonoscopy per GI

## 2018-01-14 LAB — TSH: TSH: 1.37 u[IU]/mL (ref 0.450–4.500)

## 2018-01-14 LAB — T4, FREE: FREE T4: 1.15 ng/dL (ref 0.82–1.77)

## 2018-01-15 ENCOUNTER — Telehealth: Payer: Self-pay | Admitting: Adult Health

## 2018-01-15 ENCOUNTER — Telehealth: Payer: Self-pay | Admitting: *Deleted

## 2018-01-15 NOTE — Telephone Encounter (Signed)
Left message that thyroid labs were normal

## 2018-01-15 NOTE — Telephone Encounter (Signed)
Patient informed of lab results. 

## 2018-01-17 ENCOUNTER — Ambulatory Visit (HOSPITAL_COMMUNITY)
Admission: RE | Admit: 2018-01-17 | Discharge: 2018-01-17 | Disposition: A | Discharge status: Home / Self Care | Payer: 59 | Source: Ambulatory Visit | Attending: Adult Health | Admitting: Adult Health

## 2018-01-17 DIAGNOSIS — E041 Nontoxic single thyroid nodule: Secondary | ICD-10-CM

## 2018-01-17 DIAGNOSIS — Z87898 Personal history of other specified conditions: Secondary | ICD-10-CM | POA: Insufficient documentation

## 2018-01-17 DIAGNOSIS — Z09 Encounter for follow-up examination after completed treatment for conditions other than malignant neoplasm: Secondary | ICD-10-CM | POA: Diagnosis not present

## 2018-01-20 ENCOUNTER — Telehealth: Payer: Self-pay | Admitting: *Deleted

## 2018-01-20 NOTE — Telephone Encounter (Signed)
Pt aware thyroid US normal as were labs.

## 2018-04-05 ENCOUNTER — Other Ambulatory Visit: Payer: Self-pay | Admitting: Adult Health

## 2018-05-22 ENCOUNTER — Other Ambulatory Visit: Payer: Self-pay | Admitting: Family Medicine

## 2018-05-22 DIAGNOSIS — Z1231 Encounter for screening mammogram for malignant neoplasm of breast: Secondary | ICD-10-CM

## 2018-06-18 ENCOUNTER — Ambulatory Visit (INDEPENDENT_AMBULATORY_CARE_PROVIDER_SITE_OTHER): Payer: 59

## 2018-06-18 ENCOUNTER — Ambulatory Visit (INDEPENDENT_AMBULATORY_CARE_PROVIDER_SITE_OTHER): Payer: 59 | Admitting: Orthopaedic Surgery

## 2018-06-18 ENCOUNTER — Encounter (INDEPENDENT_AMBULATORY_CARE_PROVIDER_SITE_OTHER): Payer: Self-pay | Admitting: Orthopaedic Surgery

## 2018-06-18 VITALS — BP 127/71 | HR 84 | Ht 66.75 in | Wt 121.0 lb

## 2018-06-18 DIAGNOSIS — M25552 Pain in left hip: Secondary | ICD-10-CM | POA: Diagnosis not present

## 2018-06-18 DIAGNOSIS — Z981 Arthrodesis status: Secondary | ICD-10-CM | POA: Insufficient documentation

## 2018-06-18 DIAGNOSIS — M7062 Trochanteric bursitis, left hip: Secondary | ICD-10-CM | POA: Diagnosis not present

## 2018-06-18 MED ORDER — LIDOCAINE HCL (PF) 1 % IJ SOLN
2.0000 mL | INTRAMUSCULAR | Status: AC | PRN
  Administered 2018-06-18: 2 mL

## 2018-06-18 MED ORDER — METHYLPREDNISOLONE ACETATE 40 MG/ML IJ SUSP
40.0000 mg | INTRAMUSCULAR | Status: AC | PRN
  Administered 2018-06-18: 40 mg via INTRA_ARTICULAR

## 2018-06-18 MED ORDER — LIDOCAINE HCL 1 % IJ SOLN
0.5000 mL | INTRAMUSCULAR | Status: AC | PRN
  Administered 2018-06-18: .5 mL

## 2018-06-18 NOTE — Progress Notes (Signed)
Office Visit Note   Patient: Theresa Holloway           Date of Birth: 29-May-1951           MRN: [id number] Visit Date: 06/18/2018              Requested by: Sharilyn Sites, [address], [place] [address] PCP: Sharilyn Sites, MD   Assessment & Plan: Visit Diagnoses:  1. Pain in left hip   2. Trochanteric bursitis, left hip   3. History of lumbar spinal fusion     Plan: Trochanteric injection performed with portion placed in the gluteus medius insertion site.  She got complete relief of pain postinjection was able to ambulate without pain.  She can return if she has recurrent symptoms.  Follow-Up Instructions: Return if symptoms worsen or fail to improve.   Orders:  Orders Placed This Encounter  Procedures  . Large Joint Inj: L greater trochanter  . XR HIP UNILAT W OR W/O PELVIS 2-3 VIEWS LEFT   No orders of the defined types were placed in this encounter.     Procedures: Large Joint Inj: L greater trochanter on 06/18/2018 9:39 AM Details: 22 G needle, lateral approach Medications: 0.5 mL lidocaine 1 %; 2 mL lidocaine (PF) 1 %; 40 mg methylPREDNISolone acetate 40 MG/ML      Clinical Data: No additional findings.   Subjective: Chief Complaint  Patient presents with  . Left Hip - Pain    HPI 67 year old female seen with lateral left hip pain for several weeks.  She states she has had previous back surgeries the first 2 by Dr. Joya Salm and then had a third procedure 2010 which was then L3-4 decompression and fusion and states she is done well since that time.  She denies any numbness and tingling.  Pain radiates from the left trochanter down to just above the knee laterally.  No bowel or bladder symptoms.  No incision drainage from her back.  No fever or chills.  Review of Systems patient's had previous left foot surgery 1969 right knee arthroscopy 79 hysterectomy 85 gallbladder 96 right lateral epicondylar release 97.  Finger surgery 2002.  Lumbar  procedures August 2005, November 2005, March 2006 and then L3-4 fusion done in [place] 2010.  Patient takes thyroid supplement, estrogen replacement.  Postconcussion syndrome in the distant past, insomnia otherwise 14 point review of systems negative.   Objective: Vital Signs: BP 127/71   Pulse 84   Ht 5' 6.75" (1.695 m)   Wt 121 lb (54.9 kg)   BMI 19.09 kg/m   Physical Exam  Constitutional: She is oriented to person, place, and time. She appears well-developed.  HENT:  Head: Normocephalic.  Right Ear: External ear normal.  Left Ear: External ear normal.  Eyes: Pupils are equal, round, and reactive to light.  Neck: No tracheal deviation present. No thyromegaly present.  Cardiovascular: Normal rate.  Pulmonary/Chest: Effort normal.  Abdominal: Soft.  Neurological: She is alert and oriented to person, place, and time.  Skin: Skin is warm and dry.  Psychiatric: She has a normal mood and affect. Her behavior is normal.    Ortho Exam stands with a well-healed lumbar incision midline.  Exquisite tenderness of the left greater trochanter and gluteus medius tendon.  No pain with logroll to the hips negative straight leg raising 90 degrees no pitting edema knee and ankle jerk on the right are intact trace ankle jerk on the left 2+ ankle jerk on the  right.  Sensation her foot is intact.  Distal pulses are normal.  No tenderness of the right greater trochanter.  Specialty Comments:  No specialty comments available.  Imaging: Hold lumbar spine x-rays from 12/26/2009 showed interbody bone at L4-5 with removal of hardware.  Instrumented fusion at L3-4 with 5 mm anterolisthesis possible peri-screw lucency.  Left L3 screw breaches the superior cortex.  No motion on flexion-extension views.   PMFS History: Patient Active Problem List   Diagnosis Date Noted  . Pain in left hip 06/18/2018  . Trochanteric bursitis, left hip 06/18/2018  . History of lumbar spinal fusion 06/18/2018  . Weight  gain 01/13/2018  . Hot flashes 01/13/2018  . Thyroid nodule 01/13/2018  . Encounter for gynecological examination 01/13/2018  . Current use of estrogen therapy 01/13/2018  . Rectocele 01/13/2018  . Chest pressure 02/28/2017  . SOB (shortness of breath) 02/28/2017  . Fatigue 02/28/2017  . Cold extremities 02/28/2017  . Postconcussion syndrome 01/29/2013  . Headache(784.0) 01/29/2013  . Insomnia 01/29/2013  . 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 Pain in joint, ankle and foot 05/02/2011   Past Medical History:  Diagnosis Date  . Asthma   . Chronic kidney disease 2009   cancer  . FHx: gallbladder disease   . H/O: hysterectomy   . Headache(784.0) 01/29/2013  . History of back surgery   . History of kidney cancer   . Insomnia 01/29/2013  . Osteoporosis   . Postconcussion syndrome 01/29/2013  . Right ankle injury   . Sinus tarsi syndrome     Family History  Problem Relation Age of Onset  . Heart failure Mother   . COPD Mother   . Heart attack Father     Past Surgical History:  Procedure Laterality Date  . ABDOMINAL HYSTERECTOMY    . BACK SURGERY    . BREAST BIOPSY    . CHOLECYSTECTOMY  1996  . ELBOW SURGERY    . left foot surgery  2012  . NEPHRECTOMY    . right foot surgery  2015  . right knee surgery  1979  . right ring finger surgery  2002  . STOMACH SURGERY  2003   Social History   Occupational History  . Not on file  Tobacco Use  . Smoking status: Never Smoker  . Smokeless tobacco: Never Used  Substance and Sexual Activity  . Alcohol use: No  . Drug use: No  . Sexual activity: Not Currently    Birth control/protection: Sponge    Comment: hyst

## 2018-06-23 ENCOUNTER — Ambulatory Visit
Admission: RE | Admit: 2018-06-23 | Discharge: 2018-06-23 | Disposition: A | Discharge status: Home / Self Care | Payer: 59 | Source: Ambulatory Visit | Attending: Family Medicine | Admitting: Family Medicine

## 2018-06-23 DIAGNOSIS — Z1231 Encounter for screening mammogram for malignant neoplasm of breast: Secondary | ICD-10-CM

## 2018-06-26 ENCOUNTER — Ambulatory Visit (INDEPENDENT_AMBULATORY_CARE_PROVIDER_SITE_OTHER): Payer: 59 | Admitting: Orthopaedic Surgery

## 2018-06-26 ENCOUNTER — Encounter (INDEPENDENT_AMBULATORY_CARE_PROVIDER_SITE_OTHER): Payer: Self-pay | Admitting: Orthopaedic Surgery

## 2018-06-26 VITALS — BP 111/70 | HR 84 | Ht 66.0 in | Wt 121.0 lb

## 2018-06-26 DIAGNOSIS — M25552 Pain in left hip: Secondary | ICD-10-CM

## 2018-06-26 DIAGNOSIS — Z981 Arthrodesis status: Secondary | ICD-10-CM | POA: Diagnosis not present

## 2018-06-26 NOTE — Progress Notes (Signed)
Office Visit Note   Patient: Theresa Holloway           Date of Birth: 09-29-1951           MRN: [id number] Visit Date: 06/26/2018              Requested by: Sharilyn Sites, [address], [place] [address] PCP: Sharilyn Sites, MD   Assessment & Plan: Visit Diagnoses:  1. Pain in left hip   2. History of lumbar spinal fusion     Plan: Patient has a walker at home which was her mother she will use this and touchdown weight-bear.  MRI scan of the left hip will be obtained.  She may have some tendinopathy about the hip.  Previous x-rays were negative for osteoarthritis.  She has a L4-5 and L5-S1 fusion and may have some lateral recess stenosis at the L3-4 level above her fusion.  By exam this appears to be more hip than lumbar spine with normal sensory findings and no nerve root tension signs.  I will see her back after the MRI scan of her hip for review.  Follow-Up Instructions: No follow-ups on file.   Orders:  Orders Placed This Encounter  Procedures  . MR Hip Left w/o contrast   No orders of the defined types were placed in this encounter.     Procedures: No procedures performed   Clinical Data: No additional findings.   Subjective: Chief Complaint  Patient presents with  . Left Hip - Pain, Follow-up    HPI  67 year old female returns post trochanteric and gluteus medius tendon injection on last visit 06/18/2018.  She states for 2 days she had no pain and then had recurrence of pain having severe limping has trouble walking has trouble standing.  She is taken [facility] powder and Tylenol and states that she gets up in the middle of the night with pain is not able to fall asleep.  Review of Systems 14 point review of systems updated unchanged from 06/18/2018 office visit other than as mentioned in HPI above.   Objective: Vital Signs: BP 111/70   Pulse 84   Ht 5\' 6"  (1.676 m)   Wt 121 lb (54.9 kg)   BMI 19.53 kg/m   Physical Exam  Constitutional: She is  oriented to person, place, and time. She appears well-developed.  HENT:  Head: Normocephalic.  Right Ear: External ear normal.  Left Ear: External ear normal.  Eyes: Pupils are equal, round, and reactive to light.  Neck: No tracheal deviation present. No thyromegaly present.  Cardiovascular: Normal rate.  Pulmonary/Chest: Effort normal.  Abdominal: Soft.  Neurological: She is alert and oriented to person, place, and time.  Skin: Skin is warm and dry.  Psychiatric: She has a normal mood and affect. Her behavior is normal.    Ortho Exam has difficulty getting from sitting to standing when she is up she ambulates with a short stride left with noticeable hop.  Mild sciatic notch tenderness well-healed lumbar incision negative straight leg raising 90 degrees.  Negative logroll of the hip she has exquisite tenderness with palpation over the gluteus medius tendon and over greater trochanter.  Pain radiates down laterally just above the knee and stops.  Sensation of the thigh is normal.  Negative reverse straight leg raising.  Negative contralateral straight leg raise.  Knee and ankle jerk are 2+ and symmetrical anterior tib gastrocsoleus is strong without weakness.  Some pain with resisted quad resistance  on the left none on the right.  Specialty Comments:  No specialty comments available.  Imaging: No results found.   PMFS History: Patient Active Problem List   Diagnosis Date Noted  . Pain in left hip 06/18/2018  . Trochanteric bursitis, left hip 06/18/2018  . History of lumbar spinal fusion 06/18/2018  . Weight gain 01/13/2018  . Hot flashes 01/13/2018  . Thyroid nodule 01/13/2018  . Encounter for gynecological examination 01/13/2018  . Current use of estrogen therapy 01/13/2018  . Rectocele 01/13/2018  . Chest pressure 02/28/2017  . SOB (shortness of breath) 02/28/2017  . Fatigue 02/28/2017  . Cold extremities 02/28/2017  . Postconcussion syndrome 01/29/2013  . Headache(784.0)  01/29/2013  . Insomnia 01/29/2013  . 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 Pain in joint, ankle and foot 05/02/2011   Past Medical History:  Diagnosis Date  . Asthma   . Chronic kidney disease 2009   cancer  . FHx: gallbladder disease   . H/O: hysterectomy   . Headache(784.0) 01/29/2013  . History of back surgery   . History of kidney cancer   . Insomnia 01/29/2013  . Osteoporosis   . Postconcussion syndrome 01/29/2013  . Right ankle injury   . Sinus tarsi syndrome     Family History  Problem Relation Age of Onset  . Heart failure Mother   . COPD Mother   . Heart attack Father   . Breast cancer Neg Hx     Past Surgical History:  Procedure Laterality Date  . ABDOMINAL HYSTERECTOMY    . BACK SURGERY    . BREAST BIOPSY    . CHOLECYSTECTOMY  1996  . ELBOW SURGERY    . left foot surgery  2012  . NEPHRECTOMY    . right foot surgery  2015  . right knee surgery  1979  . right ring finger surgery  2002  . STOMACH SURGERY  2003   Social History   Occupational History  . Not on file  Tobacco Use  . Smoking status: Never Smoker  . Smokeless tobacco: Never Used  Substance and Sexual Activity  . Alcohol use: No  . Drug use: No  . Sexual activity: Not Currently    Birth control/protection: Sponge    Comment: hyst

## 2018-07-06 ENCOUNTER — Ambulatory Visit
Admission: RE | Admit: 2018-07-06 | Discharge: 2018-07-06 | Disposition: A | Discharge status: Home / Self Care | Payer: 59 | Source: Ambulatory Visit | Attending: Orthopaedic Surgery | Admitting: Orthopaedic Surgery

## 2018-07-06 DIAGNOSIS — M25552 Pain in left hip: Secondary | ICD-10-CM

## 2018-07-16 ENCOUNTER — Ambulatory Visit (INDEPENDENT_AMBULATORY_CARE_PROVIDER_SITE_OTHER): Payer: 59 | Admitting: Orthopaedic Surgery

## 2018-07-16 ENCOUNTER — Encounter (INDEPENDENT_AMBULATORY_CARE_PROVIDER_SITE_OTHER): Payer: Self-pay | Admitting: Orthopaedic Surgery

## 2018-07-16 ENCOUNTER — Ambulatory Visit (INDEPENDENT_AMBULATORY_CARE_PROVIDER_SITE_OTHER): Payer: Self-pay

## 2018-07-16 VITALS — BP 129/73 | HR 77 | Ht 66.0 in | Wt 121.0 lb

## 2018-07-16 DIAGNOSIS — M545 Low back pain, unspecified: Secondary | ICD-10-CM

## 2018-07-16 DIAGNOSIS — M4807 Spinal stenosis, lumbosacral region: Secondary | ICD-10-CM | POA: Diagnosis not present

## 2018-07-16 DIAGNOSIS — G8929 Other chronic pain: Secondary | ICD-10-CM | POA: Diagnosis not present

## 2018-07-22 ENCOUNTER — Encounter (INDEPENDENT_AMBULATORY_CARE_PROVIDER_SITE_OTHER): Payer: Self-pay | Admitting: Orthopaedic Surgery

## 2018-07-22 NOTE — Progress Notes (Signed)
Office Visit Note   Patient: Theresa Holloway           Date of Birth: 1951/08/29           MRN: [id number] Visit Date: 07/16/2018              Requested by: Sharilyn Sites, [address], [place] [address] PCP: Sharilyn Sites, MD   Assessment & Plan: Visit Diagnoses:  1. Chronic left-sided low back pain, unspecified whether sciatica present   2. Spinal stenosis of lumbosacral region     Plan: MRI scan is reviewed of her hip.  She is having persistent problems and may have some lateral recess narrowing at L2-3 above her fusion.  Superior position left L3 screw noted.  No evidence of screw loosening.  Will obtain an MRI scan lumbar spine see her back after the scan for review.  Follow-Up Instructions: No follow-ups on file.   Orders:  Orders Placed This Encounter  Procedures  . XR Lumbar Spine 2-3 Views  . MR Lumbar Spine W Wo Contrast   No orders of the defined types were placed in this encounter.     Procedures: No procedures performed   Clinical Data: No additional findings.   Subjective: Chief Complaint  Patient presents with  . Left Hip - Pain    MRI review    HPI 67 year old female returns with ongoing pain in her left hip.  Previous L4-S1 fusion.  She failed relief with the trochanteric injection and is having problems with walking trouble standing.  MRI scan of her hip demonstrated some thinning of the articular cartilage in the weightbearing portion of the acetabulum.  Negative for chills or fever no bowel bladder symptoms.  She continues to have problems and is been limping she is taken [facility] powders also Tylenol.  Review of Systems Updated 14 point system unchanged from 06/18/2018 visit other than as mentioned above.  Objective: Vital Signs: BP 129/73   Pulse 77   Ht 5\' 6"  (1.676 m)   Wt 121 lb (54.9 kg)   BMI 19.53 kg/m   Physical Exam  Constitutional: She is oriented to person, place, and time. She appears well-developed.  HENT:  Head:  Normocephalic.  Right Ear: External ear normal.  Left Ear: External ear normal.  Eyes: Pupils are equal, round, and reactive to light.  Neck: No tracheal deviation present. No thyromegaly present.  Cardiovascular: Normal rate.  Pulmonary/Chest: Effort normal.  Abdominal: Soft.  Neurological: She is alert and oriented to person, place, and time.  Skin: Skin is warm and dry.  Psychiatric: She has a normal mood and affect. Her behavior is normal.    Ortho Exam patient slow getting from sitting standing she still amatory with a limp.  Well-healed lumbar incision without erythema.  She has some sciatic notch tenderness on the left negative on the right.  Ankle jerk are intact gastrocsoleus is intact.  Some discomfort with internal rotation of the left hip versus right hip.  Normal sensation over the anterior aspect of the thigh and lateral calf.  Specialty Comments:  No specialty comments available.  Imaging: CLINICAL DATA:  Left hip pain for 2 months.  EXAM: MR OF THE LEFT HIP WITHOUT CONTRAST  TECHNIQUE: Multiplanar, multisequence MR imaging was performed. No intravenous contrast was administered.  COMPARISON:  Radiographs dated 06/18/2018  FINDINGS: Bones: The bones of the hips and pelvis are normal. Previous lower lumbar fusion.  Articular cartilage and labrum  Articular cartilage: Focal slight  thinning of the articular cartilage of the superolateral aspect of the acetabulum. Otherwise normal.  Labrum:  Normal.  Joint or bursal effusion  Joint effusion:  None  Bursae: No bursitis.  Muscles and tendons  Muscles and tendons:  Normal.  Other findings  Miscellaneous:   No adenopathy or mass lesions.  IMPRESSION: Minimal focal thinning of the articular cartilage of the superior aspect of the left acetabulum. Otherwise, essentially normal exam.   Electronically Signed   By: Lorriane Shire M.D.   On: 07/07/2018 07:56   PMFS History: Patient  Active Problem List   Diagnosis Date Noted  . Pain in left hip 06/18/2018  . Trochanteric bursitis, left hip 06/18/2018  . History of lumbar spinal fusion 06/18/2018  . Weight gain 01/13/2018  . Hot flashes 01/13/2018  . Thyroid nodule 01/13/2018  . Encounter for gynecological examination 01/13/2018  . Current use of estrogen therapy 01/13/2018  . Rectocele 01/13/2018  . Chest pressure 02/28/2017  . SOB (shortness of breath) 02/28/2017  . Fatigue 02/28/2017  . Cold extremities 02/28/2017  . Postconcussion syndrome 01/29/2013  . Headache(784.0) 01/29/2013  . Insomnia 01/29/2013  . 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 Pain in joint, ankle and foot 05/02/2011   Past Medical History:  Diagnosis Date  . Asthma   . Chronic kidney disease 2009   cancer  . FHx: gallbladder disease   . H/O: hysterectomy   . Headache(784.0) 01/29/2013  . History of back surgery   . History of kidney cancer   . Insomnia 01/29/2013  . Osteoporosis   . Postconcussion syndrome 01/29/2013  . Right ankle injury   . Sinus tarsi syndrome     Family History  Problem Relation Age of Onset  . Heart failure Mother   . COPD Mother   . Heart attack Father   . Breast cancer Neg Hx     Past Surgical History:  Procedure Laterality Date  . ABDOMINAL HYSTERECTOMY    . BACK SURGERY    . BREAST BIOPSY    . CHOLECYSTECTOMY  1996  . ELBOW SURGERY    . left foot surgery  2012  . NEPHRECTOMY    . right foot surgery  2015  . right knee surgery  1979  . right ring finger surgery  2002  . STOMACH SURGERY  2003   Social History   Occupational History  . Not on file  Tobacco Use  . Smoking status: Never Smoker  . Smokeless tobacco: Never Used  Substance and Sexual Activity  . Alcohol use: No  . Drug use: No  . Sexual activity: Not Currently    Birth control/protection: Sponge    Comment: hyst

## 2018-07-30 ENCOUNTER — Ambulatory Visit (INDEPENDENT_AMBULATORY_CARE_PROVIDER_SITE_OTHER): Payer: Medicare Other | Admitting: Orthopaedic Surgery

## 2018-08-01 ENCOUNTER — Ambulatory Visit
Admission: RE | Admit: 2018-08-01 | Discharge: 2018-08-01 | Disposition: A | Discharge status: Home / Self Care | Payer: 59 | Source: Ambulatory Visit | Attending: Orthopaedic Surgery | Admitting: Orthopaedic Surgery

## 2018-08-01 DIAGNOSIS — M4807 Spinal stenosis, lumbosacral region: Secondary | ICD-10-CM

## 2018-08-01 MED ORDER — GADOBENATE DIMEGLUMINE 529 MG/ML IV SOLN
10.0000 mL | Freq: Once | INTRAVENOUS | Status: AC | PRN
  Administered 2018-08-01: 10 mL via INTRAVENOUS

## 2018-08-11 ENCOUNTER — Other Ambulatory Visit (HOSPITAL_COMMUNITY): Payer: Self-pay | Admitting: Family Medicine

## 2018-08-11 ENCOUNTER — Ambulatory Visit (HOSPITAL_COMMUNITY)
Admission: RE | Admit: 2018-08-11 | Discharge: 2018-08-11 | Disposition: A | Discharge status: Home / Self Care | Payer: 59 | Source: Ambulatory Visit | Attending: Family Medicine | Admitting: Family Medicine

## 2018-08-11 DIAGNOSIS — J069 Acute upper respiratory infection, unspecified: Secondary | ICD-10-CM

## 2018-08-11 DIAGNOSIS — J989 Respiratory disorder, unspecified: Secondary | ICD-10-CM | POA: Diagnosis present

## 2018-08-21 ENCOUNTER — Other Ambulatory Visit (HOSPITAL_COMMUNITY): Payer: Self-pay | Admitting: Family Medicine

## 2018-08-21 DIAGNOSIS — R918 Other nonspecific abnormal finding of lung field: Secondary | ICD-10-CM

## 2018-09-10 ENCOUNTER — Ambulatory Visit (HOSPITAL_COMMUNITY)
Admission: RE | Admit: 2018-09-10 | Discharge: 2018-09-10 | Disposition: A | Discharge status: Home / Self Care | Payer: 59 | Source: Ambulatory Visit | Attending: Family Medicine | Admitting: Family Medicine

## 2018-09-10 DIAGNOSIS — R918 Other nonspecific abnormal finding of lung field: Secondary | ICD-10-CM | POA: Diagnosis not present

## 2018-09-10 LAB — POCT I-STAT CREATININE: CREATININE: 0.6 mg/dL (ref 0.44–1.00)

## 2018-09-10 MED ORDER — IOHEXOL 300 MG/ML  SOLN
75.0000 mL | Freq: Once | INTRAMUSCULAR | Status: AC | PRN
  Administered 2018-09-10: 75 mL via INTRAVENOUS

## 2018-11-02 IMAGING — MR MR LUMBAR SPINE WO/W CM
5 of 8 series · 27 of 48 positions shown · IV contrast (multihance)
Comparison: Plain films lumbar spine from [REDACTED]
07/16/2018. MRI lumbar spine 07/27/2009.

CLINICAL DATA: Low back pain radiating into the left hip, buttock
and leg for 2.5 months. No known injury. History of prior lumbar
surgery x4.

Creatinine was obtained on site at [HOSPITAL] at [HOSPITAL].
Results: Creatinine 0.8 mg/dL.
EXAM:
MRI LUMBAR SPINE WITHOUT AND WITH CONTRAST
TECHNIQUE: Multiplanar and multiecho pulse sequences of the lumbar spine were
obtained without and with intravenous contrast.
CONTRAST:  10 mL MULTIHANCE GADOBENATE DIMEGLUMINE 529 MG/ML IV SOLN

[Series 4: T1 · sagittal · 4.0mm · 0.55mm/px · 3 of 14 slices shown (1 of 2)]
[im 1/14]
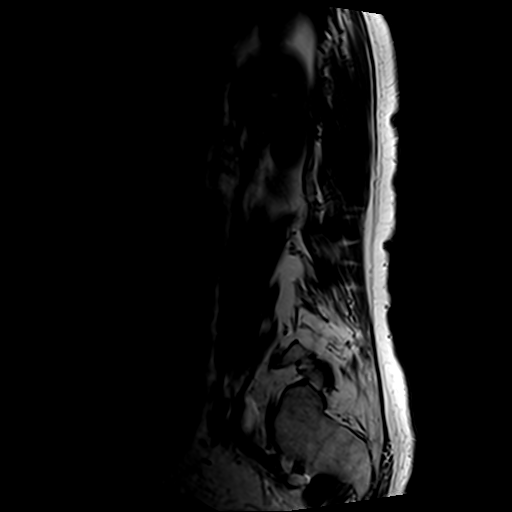
[im 7/14]
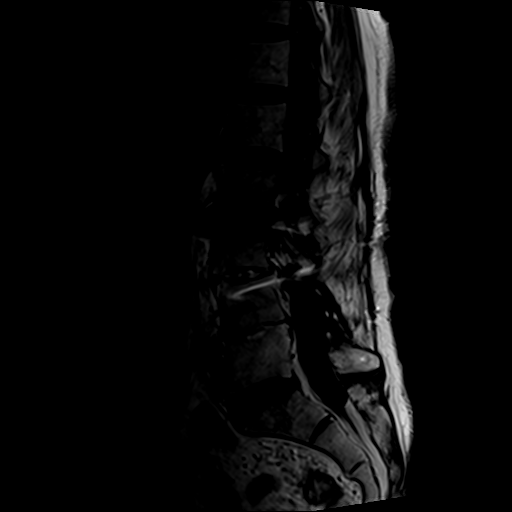
[im 14/14]
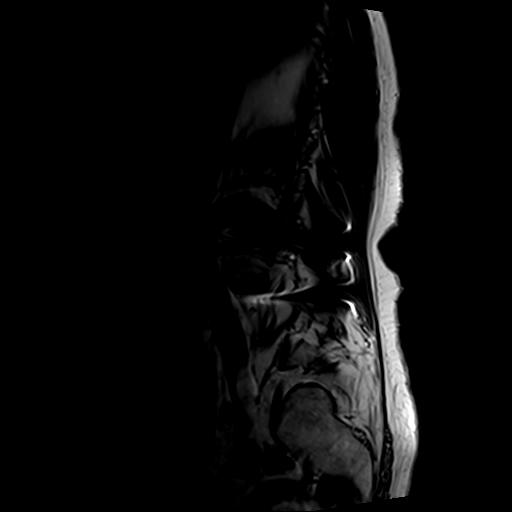

[Series 5: T1 · axial · 4.0mm · 0.35mm/px · z∈[-26,+181]mm · 8 of 35 slices shown (2 of 2)]
[im 1/35]
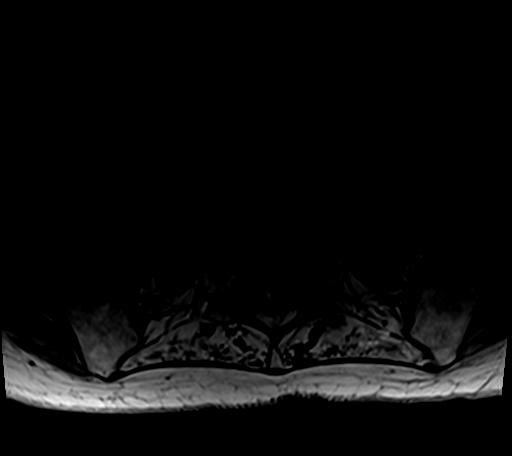
[im 4/35]
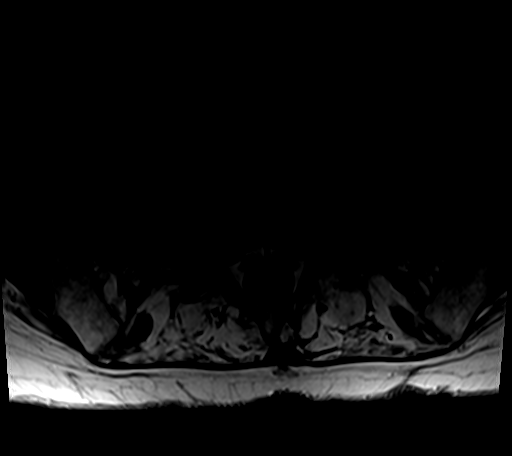
[im 12/35]
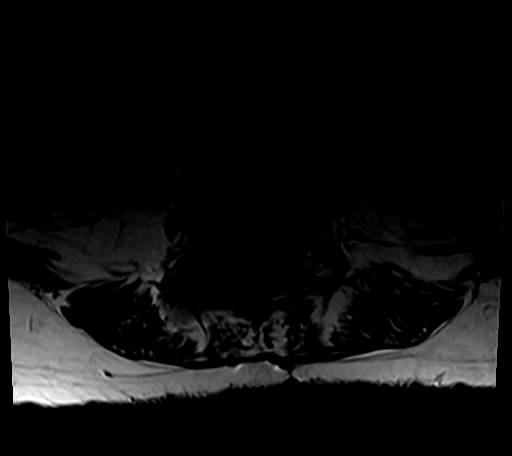
[im 16/35]
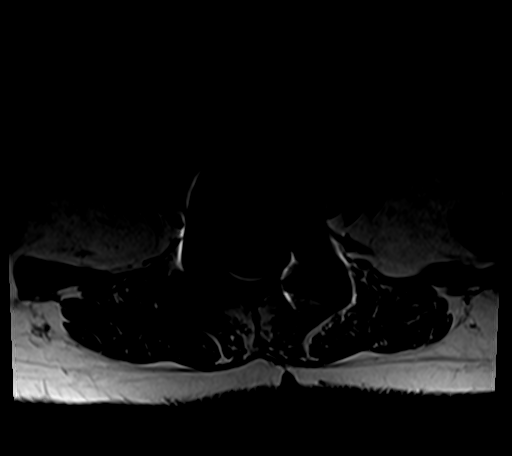
[im 19/35]
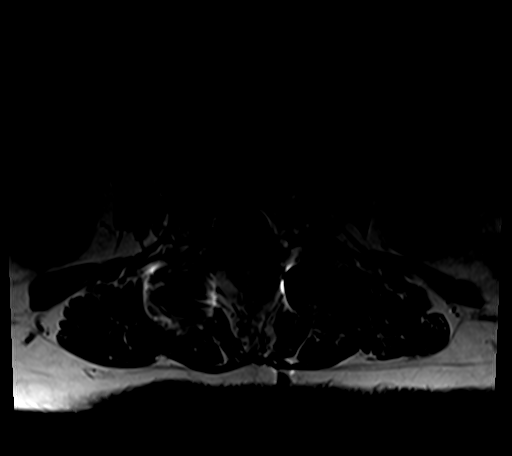
[im 23/35]
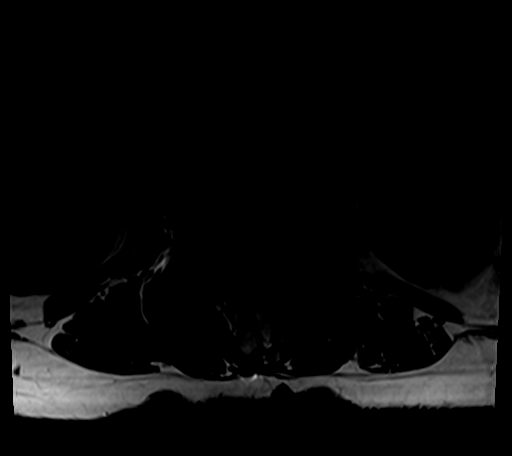
[im 31/35]
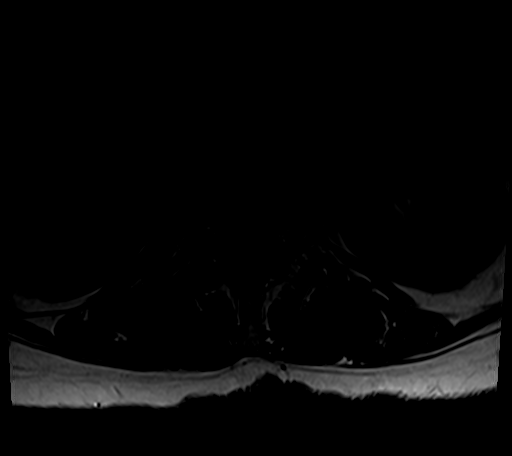
[im 35/35]
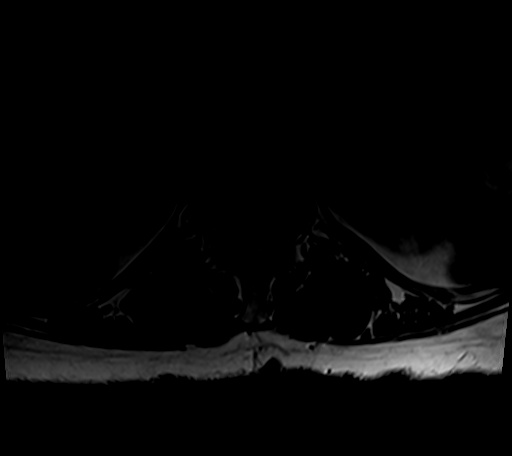

[Series 6: T2 · axial · 4.0mm · 0.70mm/px · z∈[-26,+181]mm · 8 of 35 slices shown]
[im 1/35]
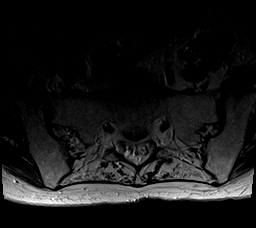
[im 4/35]
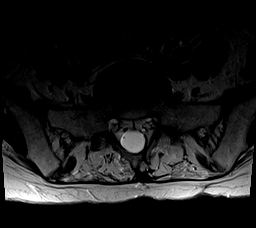
[im 12/35]
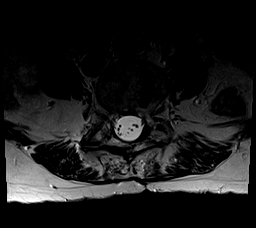
[im 16/35]
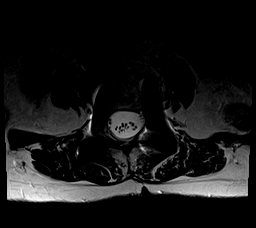
[im 19/35]
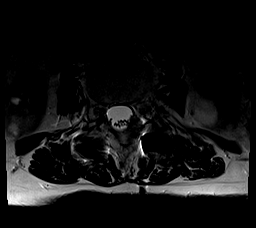
[im 23/35]
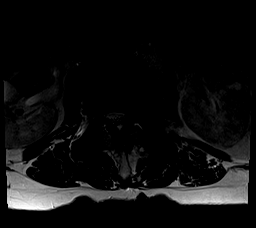
[im 31/35]
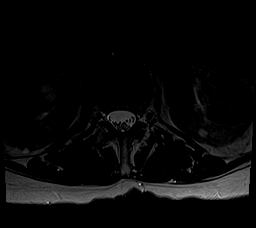
[im 35/35]
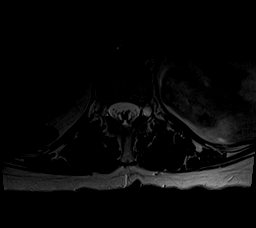

[Series 7: T2 post-contrast · sagittal · 4.0mm · 0.55mm/px · 4 of 14 slices shown]
[im 1/14]
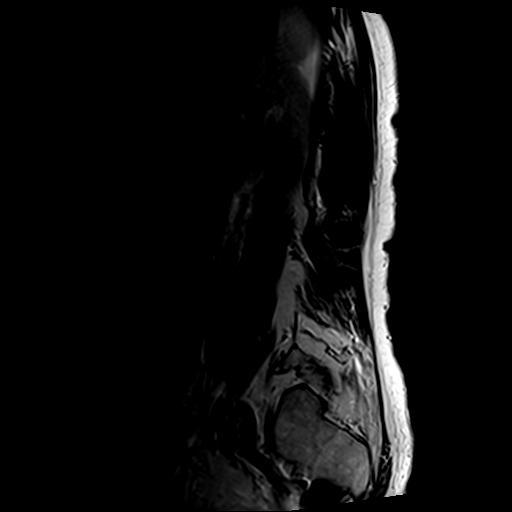
[im 5/14]
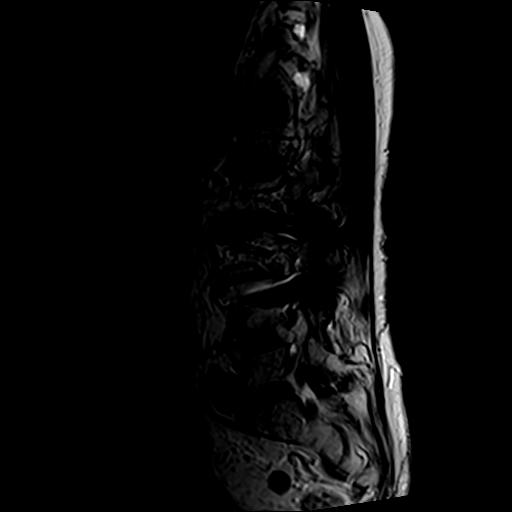
[im 9/14]
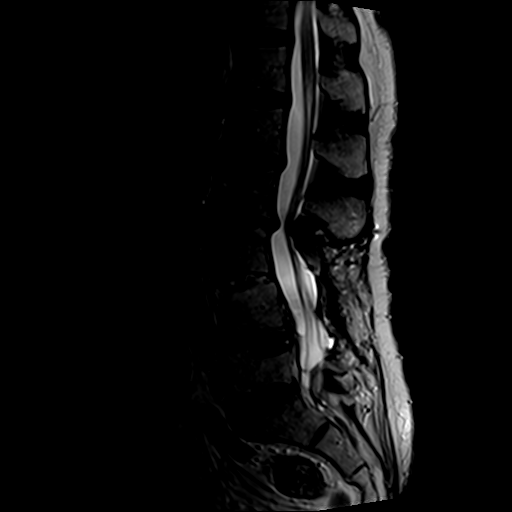
[im 14/14]
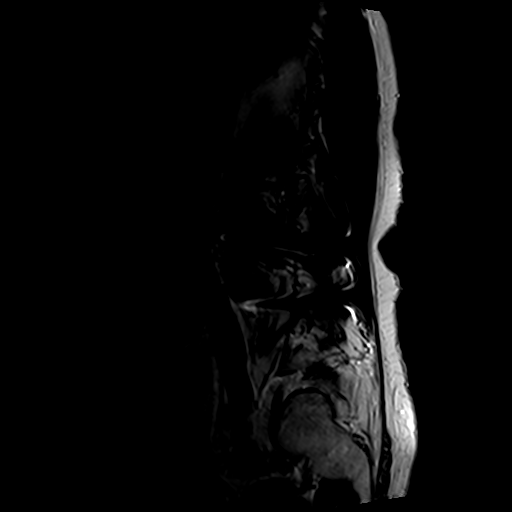

[Series 8: T1 fat-sat post-contrast · sagittal · 4.0mm · 0.88mm/px · 4 of 14 slices shown]
[im 1/14]
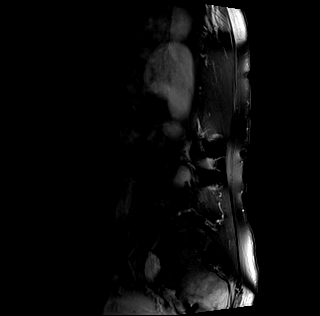
[im 5/14]
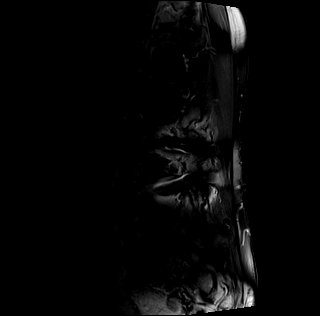
[im 9/14]
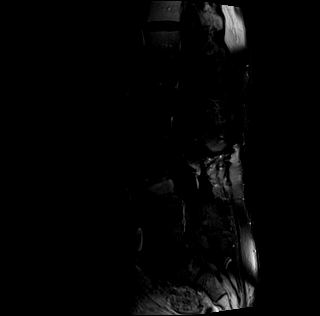
[im 14/14]
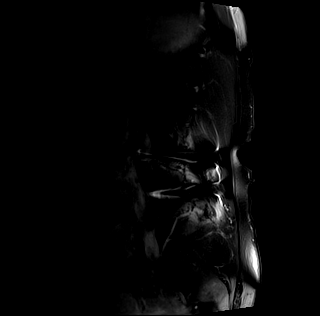

[27 of 48 positions shown; findings below may reference images not displayed]

FINDINGS: Segmentation:  Standard.

Alignment: 0.3 cm degenerative retrolisthesis L2 on L3 is new since
the prior study. Trace anterolisthesis L3 on L4 has been reduced
from 0.6 cm on the prior examination.

Vertebrae: Solid L4-5 fusion is again seen. Pedicle screws in L5
have been removed since the prior MRI. The patient has undergone
L3-4 fusion since the prior MRI. Solid bridging bone is present
across the disc interspace. No fracture or worrisome lesion.
Degenerative endplate signal change at L2-3 eccentric to the left is
new since the prior MRI.

Conus medullaris and cauda equina: Conus extends to the T12 level.
Conus and cauda equina appear normal.

Paraspinal and other soft tissues: Multiple small renal cysts are
noted.

Disc levels:

T11-12 is imaged in the sagittal plane only and negative.

T12-L1: Minimal disc bulge without stenosis.

L1-2: Very shallow disc bulge to the right.  No stenosis.

L2-3: There has been progression of degenerative disease at this
level with new loss of disc space height, a shallow broad-based disc
bulge and ligamentum flavum thickening seen. Mild bilateral facet
degenerative change is present. There is mild to moderate central
canal stenosis with some narrowing in the subarticular recesses,
more notable on the left. The right foramen is open. Mild to
moderate left foraminal narrowing is present.

L3-4: Status post laminectomy and fusion since the prior MRI. The
central canal and foramina are widely patent.

L4-5: Status post laminectomy and fusion as seen on the prior MRI.
The central canal and foramina are widely patent.

L5-S1: Mild facet degenerative change.  Otherwise negative.
IMPRESSION: Progressive degenerative disease at L2-3 where there is loss of disc
space height, ligamentum flavum thickening and a shallow disc bulge
causing mild to moderate central canal stenosis and some narrowing
in the subarticular recesses, worse on the left. There is also mild
to moderate left foraminal narrowing at this level.

Status post L3-4 laminectomy and fusion since the prior exam. The
central canal and foramina are widely patent. No change in the
appearance of solid L4-5 fusion. The central canal and foramina are
widely patent at L4-5.

## 2018-12-18 ENCOUNTER — Other Ambulatory Visit: Payer: Self-pay | Admitting: Adult Health

## 2019-01-16 ENCOUNTER — Other Ambulatory Visit: Payer: Self-pay | Admitting: Adult Health

## 2019-02-23 ENCOUNTER — Other Ambulatory Visit: Payer: Self-pay | Admitting: Dermatology

## 2019-02-23 DIAGNOSIS — C4492 Squamous cell carcinoma of skin, unspecified: Secondary | ICD-10-CM

## 2019-02-23 HISTORY — DX: Squamous cell carcinoma of skin, unspecified: C44.92

## 2019-03-11 ENCOUNTER — Other Ambulatory Visit: Payer: 59 | Admitting: Adult Health

## 2019-04-06 ENCOUNTER — Encounter: Payer: Self-pay | Admitting: Adult Health

## 2019-04-06 ENCOUNTER — Other Ambulatory Visit: Payer: Self-pay

## 2019-04-06 ENCOUNTER — Ambulatory Visit (INDEPENDENT_AMBULATORY_CARE_PROVIDER_SITE_OTHER): Payer: 59 | Admitting: Adult Health

## 2019-04-06 VITALS — BP 137/74 | HR 84 | Ht 65.0 in | Wt 130.0 lb

## 2019-04-06 DIAGNOSIS — Z1211 Encounter for screening for malignant neoplasm of colon: Secondary | ICD-10-CM | POA: Insufficient documentation

## 2019-04-06 DIAGNOSIS — E041 Nontoxic single thyroid nodule: Secondary | ICD-10-CM

## 2019-04-06 DIAGNOSIS — Z1212 Encounter for screening for malignant neoplasm of rectum: Secondary | ICD-10-CM | POA: Diagnosis not present

## 2019-04-06 DIAGNOSIS — Z01419 Encounter for gynecological examination (general) (routine) without abnormal findings: Secondary | ICD-10-CM | POA: Diagnosis not present

## 2019-04-06 DIAGNOSIS — N816 Rectocele: Secondary | ICD-10-CM

## 2019-04-06 DIAGNOSIS — Z79899 Other long term (current) drug therapy: Secondary | ICD-10-CM

## 2019-04-06 LAB — HEMOCCULT GUIAC POC 1CARD (OFFICE): Fecal Occult Blood, POC: NEGATIVE

## 2019-04-06 MED ORDER — ESTRADIOL 1 MG PO TABS: 1.0000 mg | ORAL_TABLET | Freq: Every day | ORAL | 4 refills | Status: DC

## 2019-04-06 NOTE — Progress Notes (Signed)
Patient ID: Theresa Holloway, female   DOB: 1951/07/03, 68 y.o.   MRN: [id number] History of Present Illness: Theresa Holloway is a 68 year old white female, single, sp hysterectomy in for well woman gyn exam.?bladder dropped, something in vagina  PCP is Dr Hilma Favors.   Current Medications, Allergies, Past Medical History, Past Surgical History, Family History and Social History were reviewed in [employer] record.     Review of Systems:  Patient denies any headaches, hearing loss, fatigue, blurred vision, shortness of breath, chest pain, abdominal pain, problems with bowel movements, urination, or intercourse(not currently). No joint pain or mood swings. +something in vagina   Physical Exam:BP 137/74 (BP Location: Left Arm, Patient Position: Sitting, Cuff Size: Normal)   Pulse 84   Ht 5\' 5"  (1.651 m)   Wt 130 lb (59 kg)   BMI 21.63 kg/m  General:  Well developed, well nourished, no acute distress Skin:  Warm and dry,tan Neck:  Midline trachea, right  Thyroid nodule, good ROM, no lymphadenopathy,no carotid bruits heard Lungs; Clear to auscultation bilaterally Breast:  No dominant palpable mass, retraction, or nipple discharge Cardiovascular: Regular rate and rhythm Abdomen:  Soft, non tender, no hepatosplenomegaly Pelvic:  External genitalia is normal in appearance, no lesions.  The vagina is pale with loss of moisture and rugae. Urethra has no lesions or masses. The cervix and uterus are absent. No adnexal masses or tenderness noted.Bladder is non tender, no masses felt. Rectal: Good sphincter tone, no polyps, or hemorrhoids felt.  Hemoccult negative.+rectocele, showed her with mirror Extremities/musculoskeletal:  No swelling or varicosities noted, no clubbing or cyanosis Psych:  No mood changes, alert and cooperative,seems happy Fall risk is low. PHQ 2 score 0. Examination chaperoned by Alice Rieger RN   Impression: 1. Encounter for gynecological examination   2.  Screening for colorectal cancer   3. Thyroid nodule   4. Rectocele   5. Current use of estrogen therapy       Plan:  Given medical explainer #4 Physical in 1 year Mammogram yearly Labs with PCP

## 2019-05-19 ENCOUNTER — Other Ambulatory Visit: Payer: Self-pay | Admitting: Family Medicine

## 2019-05-19 DIAGNOSIS — Z1231 Encounter for screening mammogram for malignant neoplasm of breast: Secondary | ICD-10-CM

## 2019-07-01 ENCOUNTER — Other Ambulatory Visit: Payer: Self-pay

## 2019-07-01 ENCOUNTER — Ambulatory Visit
Admission: RE | Admit: 2019-07-01 | Discharge: 2019-07-01 | Disposition: A | Discharge status: Home / Self Care | Payer: 59 | Source: Ambulatory Visit | Attending: Family Medicine | Admitting: Family Medicine

## 2019-07-01 DIAGNOSIS — Z1231 Encounter for screening mammogram for malignant neoplasm of breast: Secondary | ICD-10-CM

## 2020-02-24 ENCOUNTER — Ambulatory Visit (INDEPENDENT_AMBULATORY_CARE_PROVIDER_SITE_OTHER): Payer: 59 | Admitting: Dermatology

## 2020-02-24 ENCOUNTER — Other Ambulatory Visit: Payer: Self-pay

## 2020-02-24 DIAGNOSIS — L4050 Arthropathic psoriasis, unspecified: Secondary | ICD-10-CM | POA: Diagnosis not present

## 2020-02-24 DIAGNOSIS — L400 Psoriasis vulgaris: Secondary | ICD-10-CM | POA: Diagnosis not present

## 2020-02-24 MED ORDER — CLOBETASOL PROPIONATE 0.05 % EX FOAM: Freq: Two times a day (BID) | CUTANEOUS | 2 refills | Status: DC

## 2020-02-24 NOTE — Progress Notes (Signed)
New biologic start [employer]. [facility] to [facility] per patient and gave [employer].

## 2020-02-28 ENCOUNTER — Encounter: Payer: Self-pay | Admitting: Dermatology

## 2020-02-28 NOTE — Progress Notes (Signed)
   Follow-Up Visit   Subjective  Theresa Holloway is a 69 y.o. female who presents for the following: Psoriasis (Taking otezla x about 3 years but its not working anymore. Patients scalp, hands legs are bad. Also patients arthritis is back in hands. ).  Psoriasis Location: All over Duration:  Quality: Reading Associated Signs/Symptoms: Joint pain, skin itching Modifying Factors: Tessalon no longer helping Severity:  Timing: Context:   The following portions of the chart were reviewed this encounter and updated as appropriate: Tobacco  Allergies  Meds  Problems  Med Hx  Surg Hx  Fam Hx      Objective  Well appearing patient in no apparent distress; mood and affect are within normal limits.  Scalp, head, back, arms, legs, joints examined.   Assessment & Plan  Plaque psoriasis (5) Right Elbow - Posterior; Left Upper Arm - Posterior; Left Lower Leg - Anterior; Right Lower Leg - Anterior; Mid Occipital Scalp  We will try to switch from [place] to a biologic, perhaps Skyrizi.  I will obtain baseline labs.  Psoriasis vulgaris  Other Related Procedures Hepatitis B surface antibody,quantitative Hepatitis B surface antigen Hepatitis B core antibody, total Hepatitis C antibody QuantiFERON-TB Gold Plus ANA  Ordered Medications: clobetasol (OLUX) 0.05 % topical foam  Psoriatic arthritis (HCC) (2) Left Dorsal Hand; Right 3rd Finger Metacarpophalangeal Joint  We will try to switch to a biologic

## 2020-02-29 LAB — HEPATITIS C ANTIBODY: Hep C Virus Ab: <0.1 s/co ratio (ref 0.0–0.9)

## 2020-02-29 LAB — HEPATITIS B SURFACE ANTIBODY, QUANTITATIVE: Hepatitis B Surf Ab Quant: 777.2 m[IU]/mL (ref >9.9)

## 2020-02-29 LAB — HEPATITIS B SURFACE ANTIGEN: Hepatitis B Surface Ag: NEGATIVE

## 2020-02-29 LAB — ANA: Anti Nuclear Antibody (ANA): NEGATIVE

## 2020-02-29 LAB — HEPATITIS B CORE ANTIBODY, TOTAL: Hep B Core Total Ab: NEGATIVE

## 2020-03-08 ENCOUNTER — Telehealth: Payer: Self-pay

## 2020-03-08 ENCOUNTER — Other Ambulatory Visit: Payer: Self-pay | Admitting: Dermatology

## 2020-03-08 NOTE — Telephone Encounter (Signed)
Phone call to patient to have her call the lab that she went to and have them fax over her TB Gold test because we haven't received the results.  Patient states she will give the lab a call and have them fax the results over to [place].

## 2020-03-10 NOTE — Telephone Encounter (Signed)
Patient left a message on voicemail wanting to know if we received her labs and what the results are? Patient wants a call back at her home number [phone]

## 2020-03-11 LAB — QUANTIFERON-TB GOLD PLUS
QuantiFERON Mitogen Value: >10 IU/mL
QuantiFERON Nil Value: 0.03 IU/mL
QuantiFERON TB1 Ag Value: 0.03 IU/mL
QuantiFERON TB2 Ag Value: 0.02 IU/mL
QuantiFERON-TB Gold Plus: NEGATIVE

## 2020-03-15 NOTE — Telephone Encounter (Signed)
Skyrizi Complete form faxed over to [employer] @ [phone].

## 2020-03-15 NOTE — Telephone Encounter (Signed)
Phone call to patient to inform her that we've received her TB Gold results.  TB Gold results given to patient.

## 2020-03-25 ENCOUNTER — Other Ambulatory Visit: Payer: Self-pay | Admitting: Dermatology

## 2020-04-05 ENCOUNTER — Encounter: Payer: Self-pay | Admitting: Adult Health

## 2020-04-05 ENCOUNTER — Other Ambulatory Visit: Payer: Self-pay

## 2020-04-05 ENCOUNTER — Ambulatory Visit (INDEPENDENT_AMBULATORY_CARE_PROVIDER_SITE_OTHER): Payer: 59 | Admitting: Adult Health

## 2020-04-05 VITALS — BP 127/62 | HR 89 | Ht 65.0 in | Wt 138.0 lb

## 2020-04-05 DIAGNOSIS — Z1212 Encounter for screening for malignant neoplasm of rectum: Secondary | ICD-10-CM | POA: Diagnosis not present

## 2020-04-05 DIAGNOSIS — N816 Rectocele: Secondary | ICD-10-CM

## 2020-04-05 DIAGNOSIS — Z79899 Other long term (current) drug therapy: Secondary | ICD-10-CM

## 2020-04-05 DIAGNOSIS — Z01419 Encounter for gynecological examination (general) (routine) without abnormal findings: Secondary | ICD-10-CM | POA: Diagnosis not present

## 2020-04-05 DIAGNOSIS — E041 Nontoxic single thyroid nodule: Secondary | ICD-10-CM | POA: Diagnosis not present

## 2020-04-05 DIAGNOSIS — Z1211 Encounter for screening for malignant neoplasm of colon: Secondary | ICD-10-CM | POA: Diagnosis not present

## 2020-04-05 LAB — HEMOCCULT GUIAC POC 1CARD (OFFICE): Fecal Occult Blood, POC: NEGATIVE

## 2020-04-05 MED ORDER — ESTRADIOL 1 MG PO TABS: 1.0000 mg | ORAL_TABLET | Freq: Every day | ORAL | 4 refills | Status: DC

## 2020-04-05 NOTE — Progress Notes (Signed)
Patient ID: Theresa Holloway, female   DOB: 1951-07-16, 69 y.o.   MRN: [id number] History of Present Illness:  Theresa Holloway is a 69 year old white female,single, sp hysterectomy in for a well woman gyn exam.She is on estrace 1 mg. She works from home. She got sick from COVID vaccine and actually fell and broke her toe. PCP is Dr Hilma Favors.  Current Medications, Allergies, Past Medical History, Past Surgical History, Family History and Social History were reviewed in [employer] record.     Review of Systems: Patient denies any headaches, hearing loss, fatigue, blurred vision, shortness of breath, chest pain, abdominal pain, problems with bowel movements, urination, or intercourse(not active). No joint pain or mood swings. Has noticed hair loss and some weight gain She is happy with estrace   Physical Exam:BP 127/62 (BP Location: Right Arm, Patient Position: Sitting, Cuff Size: Normal)   Pulse 89   Ht 5\' 5"  (1.651 m)   Wt 138 lb (62.6 kg)   BMI 22.96 kg/m  General:  Well developed, well nourished, no acute distress Skin:  Warm and dry Neck:  Midline trachea, right thyroid nodule felt, good ROM, no lymphadenopathy,no carotid bruits heard Lungs; Clear to auscultation bilaterally Breast:  No dominant palpable mass, retraction, or nipple discharge Cardiovascular: Regular rate and rhythm Abdomen:  Soft, non tender, no hepatosplenomegaly Pelvic:  External genitalia is normal in appearance, no lesions.  The vagina is pale with loss of moisture and rugae . Urethra has no lesions or masses. The cervix and uterus are absent.  No adnexal masses or tenderness noted.Bladder is non tender, no masses felt. Rectal: Good sphincter tone, no polyps, or hemorrhoids felt.  Hemoccult negative.+rectocele Extremities/musculoskeletal:  No swelling or varicosities noted, no clubbing or cyanosis Psych:  No mood changes, alert and cooperative,seems happy AA 0 PHQ 9 score is 0. Examination chaperoned  by Celene Squibb LPN  Impression and Plan: 1. Encounter for well woman exam with routine gynecological exam Physical in 1 year Mammogram is yearly Labs with PCP  2. Screening for colorectal cancer  3. Current use of estrogen therapy Will refill estrace Meds ordered this encounter  Medications  . estradiol (ESTRACE) 1 MG tablet    Sig: Take 1 tablet (1 mg total) by mouth daily.    Dispense:  90 tablet    Refill:  4    Order Specific Question:   Supervising Provider    Answer:   Elonda Husky, LUTHER H [[id number]]    4. Rectocele  5. Thyroid nodule Had normal [place] 2019

## 2020-05-19 ENCOUNTER — Telehealth: Payer: Self-pay | Admitting: *Deleted

## 2020-05-19 ENCOUNTER — Ambulatory Visit (INDEPENDENT_AMBULATORY_CARE_PROVIDER_SITE_OTHER): Payer: 59 | Admitting: Dermatology

## 2020-05-19 ENCOUNTER — Other Ambulatory Visit: Payer: Self-pay

## 2020-05-19 DIAGNOSIS — L409 Psoriasis, unspecified: Secondary | ICD-10-CM | POA: Diagnosis not present

## 2020-05-19 DIAGNOSIS — D485 Neoplasm of uncertain behavior of skin: Secondary | ICD-10-CM | POA: Diagnosis not present

## 2020-05-19 MED ORDER — HALOBETASOL PROPIONATE 0.05 % EX CREA: TOPICAL_CREAM | Freq: Two times a day (BID) | CUTANEOUS | 4 refills | Status: AC | PRN

## 2020-05-19 MED ORDER — OTEZLA 10 & 20 & 30 MG PO TBPK: ORAL_TABLET | ORAL | 0 refills | Status: DC

## 2020-05-19 MED ORDER — OTEZLA 30 MG PO TABS: 30.0000 mg | ORAL_TABLET | Freq: Two times a day (BID) | ORAL | 2 refills | Status: DC

## 2020-05-19 NOTE — Progress Notes (Signed)
   Follow-Up Visit   Subjective  Theresa Holloway is a 69 y.o. female who presents for the following: Follow-up (Pt stated--Rx skyrizi- causing leg swelling, blister,vomiting, rash on the chest and legse).  Psoriasis and growth right arm Location:  Duration:  Quality: Psoriasis flaring Associated Signs/Symptoms: Modifying Factors:  Severity:  Timing: Context:   Objective  Well appearing patient in no apparent distress; mood and affect are within normal limits.  A focused examination was performed including Head, neck, back, arms, legs. Relevant physical exam findings are noted in the Assessment and Plan.   Assessment & Plan    Neoplasm of uncertain behavior of skin Right Upper Arm - Anterior  Skin / nail biopsy Type of biopsy: tangential   Informed consent: discussed and consent obtained   Timeout: patient name, date of birth, surgical site, and procedure verified   Procedure prep:  Patient was prepped and draped in usual sterile fashion Prep type:  Chlorhexidine Anesthesia: the lesion was anesthetized in a standard fashion   Anesthetic:  1% lidocaine w/ epinephrine 1-100,000 local infiltration Instrument used: flexible razor blade   Hemostasis achieved with: ferric subsulfate   Outcome: patient tolerated procedure well   Post-procedure details: wound care instructions given    Specimen 1 - Surgical pathology Differential Diagnosis: bcc vs scc Check Margins: No  Psoriasis (2) Left Lower Leg - Anterior; Right Lower Leg - Anterior  We will try to get preauthorization for Otezla.  Side effects detailed.   Rx Otezla, halobetasol.     I, Lavonna Monarch, MD, have reviewed all documentation for this visit.  The documentation on 07/03/20 for the exam, diagnosis, procedures, and orders are all accurate and complete.

## 2020-05-19 NOTE — Patient Instructions (Signed)

## 2020-05-19 NOTE — Telephone Encounter (Signed)
Per Dr. Denna Haggard Discontinue Orson Ape change to [employer]: sent  New prescription to [facility] via the Portal.

## 2020-05-20 ENCOUNTER — Other Ambulatory Visit: Payer: Self-pay | Admitting: Family Medicine

## 2020-05-20 DIAGNOSIS — Z1231 Encounter for screening mammogram for malignant neoplasm of breast: Secondary | ICD-10-CM

## 2020-06-08 ENCOUNTER — Telehealth: Payer: Self-pay | Admitting: *Deleted

## 2020-06-08 MED ORDER — OTEZLA 30 MG PO TABS: 30.0000 mg | ORAL_TABLET | Freq: Two times a day (BID) | ORAL | 2 refills | Status: DC

## 2020-06-08 MED ORDER — OTEZLA 10 & 20 & 30 MG PO TBPK: ORAL_TABLET | ORAL | 0 refills | Status: DC

## 2020-06-08 NOTE — Telephone Encounter (Signed)
[employer] approved rx sent to [facility] specialty pharmacy because insurance requires it to be filled with them.

## 2020-06-13 ENCOUNTER — Telehealth: Payer: Self-pay | Admitting: *Deleted

## 2020-06-15 ENCOUNTER — Telehealth: Payer: Self-pay | Admitting: Dermatology

## 2020-06-15 NOTE — Telephone Encounter (Signed)
[place] with [facility] left message on office voice mail saying that a prior authorization needs to be done for [facility] as soon as possible.  [facility] call back number is [phone].

## 2020-07-01 ENCOUNTER — Other Ambulatory Visit: Payer: Self-pay

## 2020-07-01 ENCOUNTER — Ambulatory Visit
Admission: RE | Admit: 2020-07-01 | Discharge: 2020-07-01 | Disposition: A | Discharge status: Home / Self Care | Payer: 59 | Source: Ambulatory Visit | Attending: Family Medicine | Admitting: Family Medicine

## 2020-07-01 ENCOUNTER — Telehealth: Payer: Self-pay | Admitting: *Deleted

## 2020-07-01 DIAGNOSIS — Z1231 Encounter for screening mammogram for malignant neoplasm of breast: Secondary | ICD-10-CM

## 2020-07-01 NOTE — Telephone Encounter (Signed)
Received refill request for Skyrizi-notified pharmacy to discontinue; patient no longer taking due to Nausea & vomiting.

## 2020-07-03 ENCOUNTER — Encounter: Payer: Self-pay | Admitting: Dermatology

## 2020-07-08 ENCOUNTER — Other Ambulatory Visit: Payer: Self-pay | Admitting: Dermatology

## 2020-07-08 MED ORDER — OTEZLA 30 MG PO TABS: 30.0000 mg | ORAL_TABLET | Freq: Two times a day (BID) | ORAL | 2 refills | Status: DC

## 2020-07-08 NOTE — Telephone Encounter (Signed)
Patient left message on office voice mail that she is still waiting on refill for [facility].  Patient says that she contacted pharmacy and they told her that they were waiting on more information from our office.  Patient says that she is almost out of her medication and would like this taken care of as soon as possible.

## 2020-07-08 NOTE — Telephone Encounter (Signed)
Let patient know I sent in her [place].

## 2020-07-11 ENCOUNTER — Telehealth: Payer: Self-pay | Admitting: Dermatology

## 2020-07-11 NOTE — Telephone Encounter (Addendum)
[facility] call [facility] to see what is going on with patients medication. Spoke with pharmacist saying no PA needed but something to do with her insurance wanting to cover a certain dose. She will call back when she gets more information.

## 2020-07-11 NOTE — Telephone Encounter (Signed)
Patient left message on office voice mail saying that per [facility] a prior authorization needs to be done for [facility].  Patient says that she will be coming by office to pick up samples of Otezla since she will be out of pills then.

## 2020-09-07 ENCOUNTER — Other Ambulatory Visit: Payer: Self-pay | Admitting: Dermatology

## 2020-11-21 ENCOUNTER — Ambulatory Visit: Payer: 59 | Admitting: Adult Health

## 2021-01-23 ENCOUNTER — Other Ambulatory Visit: Payer: Self-pay | Admitting: Dermatology

## 2021-02-28 ENCOUNTER — Other Ambulatory Visit: Payer: Self-pay | Admitting: Urology

## 2021-04-06 ENCOUNTER — Other Ambulatory Visit: Payer: Self-pay

## 2021-04-06 ENCOUNTER — Encounter: Payer: Self-pay | Admitting: Adult Health

## 2021-04-06 ENCOUNTER — Ambulatory Visit (INDEPENDENT_AMBULATORY_CARE_PROVIDER_SITE_OTHER): Payer: 59 | Admitting: Adult Health

## 2021-04-06 VITALS — BP 117/62 | HR 90 | Ht 66.5 in | Wt 129.0 lb

## 2021-04-06 DIAGNOSIS — E041 Nontoxic single thyroid nodule: Secondary | ICD-10-CM | POA: Diagnosis not present

## 2021-04-06 DIAGNOSIS — Z01419 Encounter for gynecological examination (general) (routine) without abnormal findings: Secondary | ICD-10-CM

## 2021-04-06 DIAGNOSIS — Z1211 Encounter for screening for malignant neoplasm of colon: Secondary | ICD-10-CM

## 2021-04-06 DIAGNOSIS — N816 Rectocele: Secondary | ICD-10-CM

## 2021-04-06 DIAGNOSIS — Z79899 Other long term (current) drug therapy: Secondary | ICD-10-CM | POA: Diagnosis not present

## 2021-04-06 DIAGNOSIS — N993 Prolapse of vaginal vault after hysterectomy: Secondary | ICD-10-CM

## 2021-04-06 LAB — HEMOCCULT GUIAC POC 1CARD (OFFICE): Fecal Occult Blood, POC: NEGATIVE

## 2021-04-06 MED ORDER — ESTRADIOL 1 MG PO TABS: 1.0000 mg | ORAL_TABLET | Freq: Every day | ORAL | 4 refills | Status: DC

## 2021-04-06 NOTE — Progress Notes (Signed)
Patient ID: Theresa Holloway, female   DOB: 28-Sep-1951, 70 y.o.   MRN: [id number] History of Present Illness:  Theresa Holloway is a 70 year old white female, single, sp hysterectomy, in for a well woman gyn exam and wants estrace refilled, she is aware of risks and wants to continue. She still works from home. She is having a sarcocolplexy 05/24/21, by Dr Louis Meckel. PCP is Dr Hilma Favors.  Current Medications, Allergies, Past Medical History, Past Surgical History, Family History and Social History were reviewed in [employer] record.     Review of Systems: Patient denies any headaches, hearing loss, fatigue, blurred vision, shortness of breath, chest pain, abdominal pain, problems with bowel movements,  or intercourse(not active).No joint pain or mood swings.  Has to void often, has pain in pelvic area   Physical Exam:BP 117/62 (BP Location: Right Arm, Patient Position: Sitting, Cuff Size: Normal)   Pulse 90   Ht 5' 6.5" (1.689 m)   Wt 129 lb (58.5 kg)   BMI 20.51 kg/m   General:  Well developed, well nourished, no acute distress Skin:  Warm and dry Neck:  Midline trachea, right thyroid nodule, good ROM, no lymphadenopathy,no carotid bruits heard Lungs; Clear to auscultation bilaterally Breast:  No dominant palpable mass, retraction, or nipple discharge Cardiovascular: Regular rate and rhythm Abdomen:  Soft, non tender, no hepatosplenomegaly Pelvic:  External genitalia is normal in appearance, no lesions.  The vagina dry,pale and prolapsed,  Urethra has no lesions or masses. The cervix and uterus are absent. No adnexal masses or tenderness noted.Bladder is non tender, no masses felt.I pushed prolapse back in and placed tampon to see if she felt better and she did. Rectal: Good sphincter tone, no polyps, or hemorrhoids felt.  Hemoccult negative.+rectocele Extremities/musculoskeletal:  No swelling or varicosities noted, no clubbing or cyanosis Psych:  No mood changes, alert and  cooperative,seems happy AA is 0 Depression screen [facility] 2/9 04/06/2021 04/05/2020 04/06/2019  Decreased Interest 3 0 0  Down, Depressed, Hopeless 1 0 0  PHQ - 2 Score 4 0 0  Altered sleeping 2 0 -  Tired, decreased energy 2 0 -  Change in appetite 1 0 -  Feeling bad or failure about yourself  0 0 -  Trouble concentrating 0 0 -  Moving slowly or fidgety/restless 0 0 -  Suicidal thoughts 0 0 -  PHQ-9 Score 9 0 -    GAD 7 : Generalized Anxiety Score 04/06/2021 04/05/2020  Nervous, Anxious, on Edge 1 0  Control/stop worrying 1 0  Worry too much - different things 1 0  Trouble relaxing 1 0  Restless 0 0  Easily annoyed or irritable 1 0  Afraid - awful might happen 0 0  Total GAD 7 Score 5 0      Upstream - 04/06/21 1605       Pregnancy Intention Screening   Does the patient want to become pregnant in the next year? No    Does the patient's partner want to become pregnant in the next year? No    Would the patient like to discuss contraceptive options today? No      Contraception Wrap Up   Current Method Female Sterilization   hyst   End Method Female Sterilization    Contraception Counseling Provided No            Examination chaperoned by Glenard Haring RN  Impression and Plan: 1. Encounter for well woman exam with routine gynecological exam Physical in 1 year  Mammogram yearly Labs with PCP   2. Current use of estrogen therapy Refilled estrace  Meds ordered this encounter  Medications   estradiol (ESTRACE) 1 MG tablet    Sig: Take 1 tablet (1 mg total) by mouth daily.    Dispense:  90 tablet    Refill:  4    Order Specific Question:   Supervising Provider    Answer:   Elonda Husky, LUTHER H [[id number]]     3. Rectocele   4. Thyroid nodule Normal [place] 01/17/18  5. Complete prolapse of vaginal vault Having surgery 05/24/21  6. Encounter for screening fecal occult blood testing

## 2021-05-10 NOTE — Patient Instructions (Signed)
DUE TO COVID-19 ONLY ONE VISITOR IS ALLOWED TO COME WITH YOU AND STAY IN THE WAITING ROOM ONLY DURING PRE OP AND PROCEDURE DAY OF SURGERY. THE 2 VISITORS  MAY VISIT WITH YOU AFTER SURGERY IN YOUR PRIVATE ROOM DURING VISITING HOURS ONLY!  YOU NEED TO HAVE A COVID 19 TEST ON__8/8_____ '@__[address] between  8 am and 3 pm___, THIS TEST MUST BE DONE BEFORE SURGERY,   INSTRUCTIONS AS OUTLINED IN YOUR HANDOUT.                Theresa Holloway     Your procedure is scheduled on: 05/24/21   Report to [facility] Main  Entrance   Report to admitting at   10:00 AM     Call this number if you have problems the morning of surgery [phone]   Follow instructions for the fleets from the office.   Remember: Do not eat food after Midnight.      You may have clear liquids until 9:00 am  CLEAR LIQUID DIET   Foods Allowed                                                                     Foods Excluded  Coffee and tea, regular and decaf                             liquids that you cannot  Plain Jell-O any favor except red or purple                                           see through such as: Fruit ices (not with fruit pulp)                                     milk, soups, orange juice  Iced Popsicles                                    All solid food Carbonated beverages, regular and diet                                      apple juices Sports drinks like Gatorade Lightly seasoned clear broth or consume(fat free) Sugar, honey syrup      BRUSH YOUR TEETH MORNING OF SURGERY AND RINSE YOUR MOUTH OUT, NO CHEWING GUM CANDY OR MINTS.     Take these medicines the morning of surgery with A SIP OF WATER: Liothyronine, Pantoprazole use your inhaler and bring it with you.                                You may not have any metal on your body including hair pins and              piercings  Do not wear jewelry, make-up, lotions, powders or perfumes, deodorant             Do  not wear nail polish on your fingernails.  Do not shave  48 hours prior to surgery.              Do not bring valuables to the hospital. [place].  Contacts, dentures or bridgework may not be worn into surgery.     Special Instructions: N/A              Please read over the following fact sheets you were given: _____________________________________________________________________             [facility] - Preparing for Surgery Before surgery, you can play an important role.  Because skin is not sterile, your skin needs to be as free of germs as possible.  You can reduce the number of germs on your skin by washing with CHG (chlorahexidine gluconate) soap before surgery.  CHG is an antiseptic cleaner which kills germs and bonds with the skin to continue killing germs even after washing. Please DO NOT use if you have an allergy to CHG or antibacterial soaps.  If your skin becomes reddened/irritated stop using the CHG and inform your nurse when you arrive at Short Stay. Do not shave (including legs and underarms) for at least 48 hours prior to the first CHG shower.   . Please follow these instructions carefully:  1.  Shower with CHG Soap the night before surgery and the  morning of Surgery.  2.  If you choose to wash your hair, wash your hair first as usual with your  normal  shampoo.  3.  After you shampoo, rinse your hair and body thoroughly to remove the  shampoo.                                        4.  Use CHG as you would any other liquid soap.  You can apply chg directly  to the skin and wash                       Gently with a scrungie or clean washcloth.  5.  Apply the CHG Soap to your body ONLY FROM THE NECK DOWN.   Do not use on face/ open                           Wound or open sores. Avoid contact with eyes, ears mouth and genitals (private parts).                       Wash face,  Genitals (private parts) with your normal soap.              6.  Wash thoroughly, paying special attention to the area where your surgery  will be performed.  7.  Thoroughly rinse your body with warm water from the neck down.  8.  DO NOT shower/wash with your normal soap after using and rinsing off  the CHG Soap.             9.  Pat yourself dry with a clean towel.  10.  Wear clean pajamas.            11.  Place clean sheets on your bed the night of your first shower and do not  sleep with pets. Day of Surgery : Do not apply any lotions/deodorants the morning of surgery.  Please wear clean clothes to the hospital/surgery center.  FAILURE TO FOLLOW THESE INSTRUCTIONS MAY RESULT IN THE CANCELLATION OF YOUR SURGERY PATIENT SIGNATURE_________________________________  NURSE SIGNATURE__________________________________  ________________________________________________________________________

## 2021-05-11 ENCOUNTER — Other Ambulatory Visit: Payer: Self-pay

## 2021-05-11 ENCOUNTER — Encounter (HOSPITAL_COMMUNITY): Payer: Self-pay

## 2021-05-11 ENCOUNTER — Encounter (HOSPITAL_COMMUNITY)
Admission: RE | Admit: 2021-05-11 | Discharge: 2021-05-11 | Disposition: A | Discharge status: Home / Self Care | Payer: 59 | Source: Ambulatory Visit | Attending: Urology | Admitting: Urology

## 2021-05-11 DIAGNOSIS — Z01818 Encounter for other preprocedural examination: Secondary | ICD-10-CM | POA: Insufficient documentation

## 2021-05-11 HISTORY — DX: Hypothyroidism, unspecified: E03.9

## 2021-05-11 HISTORY — DX: Other specified postprocedural states: Z98.890

## 2021-05-11 HISTORY — DX: Chronic obstructive pulmonary disease, unspecified: J44.9

## 2021-05-11 HISTORY — DX: Other complications of anesthesia, initial encounter: T88.59XA

## 2021-05-11 HISTORY — DX: Gastro-esophageal reflux disease without esophagitis: K21.9

## 2021-05-11 HISTORY — DX: Nausea with vomiting, unspecified: R11.2

## 2021-05-11 LAB — COMPREHENSIVE METABOLIC PANEL
ALT: 14 U/L (ref 0–44)
AST: 14 U/L — ABNORMAL LOW (ref 15–41)
Albumin: 3 g/dL — ABNORMAL LOW (ref 3.5–5.0)
Alkaline Phosphatase: 79 U/L (ref 38–126)
Anion gap: 14 (ref 5–15)
BUN: 11 mg/dL (ref 8–23)
CO2: 19 mmol/L — ABNORMAL LOW (ref 22–32)
Calcium: 9 mg/dL (ref 8.9–10.3)
Chloride: 104 mmol/L (ref 98–111)
Creatinine, Ser: 0.61 mg/dL (ref 0.44–1.00)
GFR, Estimated: >60 mL/min (ref >60)
Glucose, Bld: 100 mg/dL — ABNORMAL HIGH (ref 70–99)
Potassium: 3.5 mmol/L (ref 3.5–5.1)
Sodium: 137 mmol/L (ref 135–145)
Total Bilirubin: 0.4 mg/dL (ref 0.3–1.2)
Total Protein: 6.8 g/dL (ref 6.5–8.1)

## 2021-05-11 LAB — CBC
HCT: 33.8 % — ABNORMAL LOW (ref 36.0–46.0)
Hemoglobin: 10.7 g/dL — ABNORMAL LOW (ref 12.0–15.0)
MCH: 28.3 pg (ref 26.0–34.0)
MCHC: 31.7 g/dL (ref 30.0–36.0)
MCV: 89.4 fL (ref 80.0–100.0)
Platelets: 494 10*3/uL — ABNORMAL HIGH (ref 150–400)
RBC: 3.78 MIL/uL — ABNORMAL LOW (ref 3.87–5.11)
RDW: 14.4 % (ref 11.5–15.5)
WBC: 15.4 10*3/uL — ABNORMAL HIGH (ref 4.0–10.5)
nRBC: 0 % (ref 0.0–0.2)

## 2021-05-11 LAB — TYPE AND SCREEN
ABO/RH(D): A POS
Antibody Screen: NEGATIVE

## 2021-05-11 NOTE — Progress Notes (Signed)
COVID Vaccine Completed:Yes Date COVID Vaccine completed:03/28/21 COVID vaccine manufacturer:  [place]   PCP - Dr. Eddie Candle LOV 05/05/21 Cardiologist - Dr. Debara Pickett LOV 03/22/17. Pt had a cardiac work up that was WNL. It was due to stress at her job.  Chest x-ray - no EKG - 05/11/21-chart Stress Test - 03/22/17-epic ECHO - 03/22/17-epic Cardiac Cath - no Pacemaker/ICD device last checked:NA  Sleep Study - no CPAP -   Fasting Blood Sugar - NA Checks Blood Sugar _____ times a day  Blood Thinner Instructions:NA Aspirin Instructions: Last Dose:  Anesthesia review: no  Patient denies shortness of breath, fever, cough and chest pain at PAT appointment Pt walks every day and has no SOB with any activities.  Patient verbalized understanding of instructions that were given to them at the PAT appointment. Patient was also instructed that they will need to review over the PAT instructions again at home before surgery. yes

## 2021-05-14 LAB — URINE CULTURE: Culture: 80000 — AB

## 2021-05-15 ENCOUNTER — Observation Stay (HOSPITAL_COMMUNITY): Payer: 59

## 2021-05-15 ENCOUNTER — Other Ambulatory Visit: Payer: Self-pay

## 2021-05-15 ENCOUNTER — Inpatient Hospital Stay (HOSPITAL_COMMUNITY)
Admission: EM | Admit: 2021-05-15 | Discharge: 2021-05-26 | DRG: 854 | Disposition: A | Discharge status: Home / Self Care | Payer: 59 | Attending: Urology | Admitting: Urology

## 2021-05-15 ENCOUNTER — Emergency Department (HOSPITAL_COMMUNITY): Payer: 59

## 2021-05-15 DIAGNOSIS — M810 Age-related osteoporosis without current pathological fracture: Secondary | ICD-10-CM | POA: Diagnosis present

## 2021-05-15 DIAGNOSIS — N136 Pyonephrosis: Secondary | ICD-10-CM | POA: Diagnosis present

## 2021-05-15 DIAGNOSIS — A419 Sepsis, unspecified organism: Secondary | ICD-10-CM | POA: Diagnosis not present

## 2021-05-15 DIAGNOSIS — Z20822 Contact with and (suspected) exposure to covid-19: Secondary | ICD-10-CM | POA: Diagnosis present

## 2021-05-15 DIAGNOSIS — Z882 Allergy status to sulfonamides status: Secondary | ICD-10-CM

## 2021-05-15 DIAGNOSIS — Z885 Allergy status to narcotic agent status: Secondary | ICD-10-CM

## 2021-05-15 DIAGNOSIS — J449 Chronic obstructive pulmonary disease, unspecified: Secondary | ICD-10-CM | POA: Diagnosis present

## 2021-05-15 DIAGNOSIS — Z85528 Personal history of other malignant neoplasm of kidney: Secondary | ICD-10-CM

## 2021-05-15 DIAGNOSIS — Z9049 Acquired absence of other specified parts of digestive tract: Secondary | ICD-10-CM

## 2021-05-15 DIAGNOSIS — Z880 Allergy status to penicillin: Secondary | ICD-10-CM

## 2021-05-15 DIAGNOSIS — Z8249 Family history of ischemic heart disease and other diseases of the circulatory system: Secondary | ICD-10-CM

## 2021-05-15 DIAGNOSIS — N8110 Cystocele, unspecified: Secondary | ICD-10-CM

## 2021-05-15 DIAGNOSIS — K219 Gastro-esophageal reflux disease without esophagitis: Secondary | ICD-10-CM | POA: Diagnosis present

## 2021-05-15 DIAGNOSIS — R6510 Systemic inflammatory response syndrome (SIRS) of non-infectious origin without acute organ dysfunction: Secondary | ICD-10-CM

## 2021-05-15 DIAGNOSIS — Z1612 Extended spectrum beta lactamase (ESBL) resistance: Secondary | ICD-10-CM | POA: Diagnosis present

## 2021-05-15 DIAGNOSIS — Z79890 Hormone replacement therapy (postmenopausal): Secondary | ICD-10-CM

## 2021-05-15 DIAGNOSIS — E039 Hypothyroidism, unspecified: Secondary | ICD-10-CM | POA: Diagnosis present

## 2021-05-15 DIAGNOSIS — A4151 Sepsis due to Escherichia coli [E. coli]: Principal | ICD-10-CM | POA: Diagnosis present

## 2021-05-15 DIAGNOSIS — J189 Pneumonia, unspecified organism: Secondary | ICD-10-CM

## 2021-05-15 DIAGNOSIS — N179 Acute kidney failure, unspecified: Secondary | ICD-10-CM

## 2021-05-15 DIAGNOSIS — R6520 Severe sepsis without septic shock: Secondary | ICD-10-CM | POA: Diagnosis present

## 2021-05-15 DIAGNOSIS — N390 Urinary tract infection, site not specified: Secondary | ICD-10-CM

## 2021-05-15 DIAGNOSIS — F419 Anxiety disorder, unspecified: Secondary | ICD-10-CM | POA: Diagnosis present

## 2021-05-15 DIAGNOSIS — A4159 Other Gram-negative sepsis: Secondary | ICD-10-CM | POA: Diagnosis present

## 2021-05-15 DIAGNOSIS — E876 Hypokalemia: Secondary | ICD-10-CM

## 2021-05-15 DIAGNOSIS — Z7951 Long term (current) use of inhaled steroids: Secondary | ICD-10-CM

## 2021-05-15 DIAGNOSIS — N3941 Urge incontinence: Secondary | ICD-10-CM | POA: Diagnosis present

## 2021-05-15 DIAGNOSIS — Z90710 Acquired absence of both cervix and uterus: Secondary | ICD-10-CM

## 2021-05-15 DIAGNOSIS — E279 Disorder of adrenal gland, unspecified: Secondary | ICD-10-CM | POA: Diagnosis present

## 2021-05-15 DIAGNOSIS — R109 Unspecified abdominal pain: Secondary | ICD-10-CM

## 2021-05-15 DIAGNOSIS — Z87891 Personal history of nicotine dependence: Secondary | ICD-10-CM

## 2021-05-15 DIAGNOSIS — E871 Hypo-osmolality and hyponatremia: Secondary | ICD-10-CM | POA: Diagnosis present

## 2021-05-15 DIAGNOSIS — Z85828 Personal history of other malignant neoplasm of skin: Secondary | ICD-10-CM

## 2021-05-15 DIAGNOSIS — Z905 Acquired absence of kidney: Secondary | ICD-10-CM

## 2021-05-15 DIAGNOSIS — Z79899 Other long term (current) drug therapy: Secondary | ICD-10-CM

## 2021-05-15 LAB — URINALYSIS, ROUTINE W REFLEX MICROSCOPIC
Bilirubin Urine: NEGATIVE
Glucose, UA: NEGATIVE mg/dL
Hgb urine dipstick: NEGATIVE
Ketones, ur: NEGATIVE mg/dL
Nitrite: POSITIVE — AB
Protein, ur: 30 mg/dL — AB
Specific Gravity, Urine: 1.013 (ref 1.005–1.030)
WBC, UA: >50 WBC/hpf — ABNORMAL HIGH (ref 0–5)
pH: 6 (ref 5.0–8.0)

## 2021-05-15 LAB — CBC WITH DIFFERENTIAL/PLATELET
Abs Immature Granulocytes: 0.3 10*3/uL — ABNORMAL HIGH (ref 0.00–0.07)
Basophils Absolute: 0.1 10*3/uL (ref 0.0–0.1)
Basophils Relative: 0 %
Eosinophils Absolute: 0 10*3/uL (ref 0.0–0.5)
Eosinophils Relative: 0 %
HCT: 31 % — ABNORMAL LOW (ref 36.0–46.0)
Hemoglobin: 10 g/dL — ABNORMAL LOW (ref 12.0–15.0)
Immature Granulocytes: 1 %
Lymphocytes Relative: 6 %
Lymphs Abs: 1.5 10*3/uL (ref 0.7–4.0)
MCH: 28.3 pg (ref 26.0–34.0)
MCHC: 32.3 g/dL (ref 30.0–36.0)
MCV: 87.8 fL (ref 80.0–100.0)
Monocytes Absolute: 1.2 10*3/uL — ABNORMAL HIGH (ref 0.1–1.0)
Monocytes Relative: 5 %
Neutro Abs: 22.3 10*3/uL — ABNORMAL HIGH (ref 1.7–7.7)
Neutrophils Relative %: 88 %
Platelets: 615 10*3/uL — ABNORMAL HIGH (ref 150–400)
RBC: 3.53 MIL/uL — ABNORMAL LOW (ref 3.87–5.11)
RDW: 14.4 % (ref 11.5–15.5)
WBC: 25.5 10*3/uL — ABNORMAL HIGH (ref 4.0–10.5)
nRBC: 0 % (ref 0.0–0.2)

## 2021-05-15 LAB — COMPREHENSIVE METABOLIC PANEL
ALT: 17 U/L (ref 0–44)
AST: 16 U/L (ref 15–41)
Albumin: 2.9 g/dL — ABNORMAL LOW (ref 3.5–5.0)
Alkaline Phosphatase: 106 U/L (ref 38–126)
Anion gap: 11 (ref 5–15)
BUN: 14 mg/dL (ref 8–23)
CO2: 22 mmol/L (ref 22–32)
Calcium: 8.9 mg/dL (ref 8.9–10.3)
Chloride: 101 mmol/L (ref 98–111)
Creatinine, Ser: 1.02 mg/dL — ABNORMAL HIGH (ref 0.44–1.00)
GFR, Estimated: 60 mL/min — ABNORMAL LOW (ref >60)
Glucose, Bld: 100 mg/dL — ABNORMAL HIGH (ref 70–99)
Potassium: 2.8 mmol/L — ABNORMAL LOW (ref 3.5–5.1)
Sodium: 134 mmol/L — ABNORMAL LOW (ref 135–145)
Total Bilirubin: 0.6 mg/dL (ref 0.3–1.2)
Total Protein: 7.1 g/dL (ref 6.5–8.1)

## 2021-05-15 LAB — LACTIC ACID, PLASMA: Lactic Acid, Venous: 1.1 mmol/L (ref 0.5–1.9)

## 2021-05-15 LAB — RESP PANEL BY RT-PCR (FLU A&B, COVID) ARPGX2
Influenza A by PCR: NEGATIVE
Influenza B by PCR: NEGATIVE
SARS Coronavirus 2 by RT PCR: NEGATIVE

## 2021-05-15 LAB — PROTIME-INR
INR: 1 (ref 0.8–1.2)
Prothrombin Time: 13.6 seconds (ref 11.4–15.2)

## 2021-05-15 LAB — APTT: aPTT: 44 seconds — ABNORMAL HIGH (ref 24–36)

## 2021-05-15 MED ORDER — ACETAMINOPHEN 325 MG PO TABS
650.0000 mg | ORAL_TABLET | Freq: Four times a day (QID) | ORAL | Status: DC | PRN
  Administered 2021-05-15 – 2021-05-23 (×10): 650 mg via ORAL
  Filled 2021-05-15 (×10): qty 2

## 2021-05-15 MED ORDER — PHENAZOPYRIDINE HCL 100 MG PO TABS
100.0000 mg | ORAL_TABLET | Freq: Three times a day (TID) | ORAL | Status: DC
  Administered 2021-05-16 (×2): 100 mg via ORAL
  Filled 2021-05-15 (×4): qty 1

## 2021-05-15 MED ORDER — SODIUM CHLORIDE 0.9 % IV BOLUS
1000.0000 mL | Freq: Once | INTRAVENOUS | Status: AC
Start: 2021-05-15 — End: 2021-05-15
  Administered 2021-05-15: 1000 mL via INTRAVENOUS

## 2021-05-15 MED ORDER — LACTATED RINGERS IV SOLN: INTRAVENOUS | Status: DC

## 2021-05-15 MED ORDER — ONDANSETRON HCL 4 MG/2ML IJ SOLN
4.0000 mg | Freq: Four times a day (QID) | INTRAMUSCULAR | Status: DC | PRN
  Administered 2021-05-16 – 2021-05-25 (×9): 4 mg via INTRAVENOUS
  Filled 2021-05-15 (×9): qty 2

## 2021-05-15 MED ORDER — ENOXAPARIN SODIUM 40 MG/0.4ML IJ SOSY
40.0000 mg | PREFILLED_SYRINGE | Freq: Every day | INTRAMUSCULAR | Status: DC
  Administered 2021-05-16 – 2021-05-17 (×2): 40 mg via SUBCUTANEOUS
  Filled 2021-05-15 (×2): qty 0.4

## 2021-05-15 MED ORDER — DM-GUAIFENESIN ER 30-600 MG PO TB12
1.0000 | ORAL_TABLET | Freq: Two times a day (BID) | ORAL | Status: DC
  Administered 2021-05-15 – 2021-05-17 (×4): 1 via ORAL
  Filled 2021-05-15 (×4): qty 1

## 2021-05-15 MED ORDER — ONDANSETRON HCL 4 MG/2ML IJ SOLN
4.0000 mg | Freq: Once | INTRAMUSCULAR | Status: AC
Start: 2021-05-15 — End: 2021-05-15
  Administered 2021-05-15: 4 mg via INTRAVENOUS
  Filled 2021-05-15: qty 2

## 2021-05-15 MED ORDER — SODIUM CHLORIDE 0.9 % IV BOLUS
1000.0000 mL | Freq: Once | INTRAVENOUS | Status: AC
  Administered 2021-05-15: 1000 mL via INTRAVENOUS

## 2021-05-15 MED ORDER — PHENAZOPYRIDINE HCL 200 MG PO TABS
200.0000 mg | ORAL_TABLET | Freq: Once | ORAL | Status: AC
  Administered 2021-05-15: 200 mg via ORAL
  Filled 2021-05-15: qty 1

## 2021-05-15 MED ORDER — POTASSIUM CHLORIDE CRYS ER 20 MEQ PO TBCR
40.0000 meq | EXTENDED_RELEASE_TABLET | Freq: Once | ORAL | Status: AC
  Administered 2021-05-15: 40 meq via ORAL
  Filled 2021-05-15: qty 2

## 2021-05-15 MED ORDER — BENZONATATE 100 MG PO CAPS
200.0000 mg | ORAL_CAPSULE | Freq: Once | ORAL | Status: AC
  Administered 2021-05-15: 200 mg via ORAL
  Filled 2021-05-15: qty 2

## 2021-05-15 MED ORDER — TRAMADOL HCL 50 MG PO TABS
50.0000 mg | ORAL_TABLET | Freq: Four times a day (QID) | ORAL | Status: AC | PRN
  Administered 2021-05-15 – 2021-05-17 (×5): 50 mg via ORAL
  Filled 2021-05-15 (×5): qty 1

## 2021-05-15 MED ORDER — CIPROFLOXACIN IN D5W 400 MG/200ML IV SOLN
400.0000 mg | Freq: Once | INTRAVENOUS | Status: AC
  Administered 2021-05-15: 400 mg via INTRAVENOUS
  Filled 2021-05-15: qty 200

## 2021-05-15 MED ORDER — CIPROFLOXACIN HCL 500 MG PO TABS: 500.0000 mg | ORAL_TABLET | Freq: Two times a day (BID) | ORAL | Status: DC

## 2021-05-15 MED ORDER — CIPROFLOXACIN IN D5W 400 MG/200ML IV SOLN
400.0000 mg | Freq: Two times a day (BID) | INTRAVENOUS | Status: DC
  Administered 2021-05-16: 400 mg via INTRAVENOUS
  Filled 2021-05-15: qty 200

## 2021-05-15 NOTE — H&P (Addendum)
History and Physical    Theresa Holloway [id number] DOB: 1950/11/01 DOA: 05/15/2021  PCP: Sharilyn Sites, MD  Patient coming from: Home  I have personally briefly reviewed patient's old medical records in [place]  Chief Complaint: fever, dysuria and hematuria  HPI: Theresa Holloway is a 70 y.o. female with medical history significant for COPD, asthma, GERD, bladder prolapse who presents with fever, dysuria and hematuria.   Patient had bladder prolapse since December 2021 and is not scheduled to have surgery with urology until 05/24/21.  States she has felt terrible since then with urinary urgency and frequency.  She felt especially worse in the past 4 weeks, states she had daily temperature up to 103 and felt she was having recurrent infection.  States no one was listening to her to treated.  Supposedly her PCP has only treated her for sinus infection during this time.  She presented to urology today with worsening dysuria with severe burning pain and hematuria.  A Foley was placed and she was sent here for concerns of sepsis. She was noted to have a UA on 7/28 that was taken for a preop eval which grew Klebsiella.  Has nausea but denies vomiting.  No diarrhea. Has abdominal pain. No flank pain. Has also been having increased cough and some mild shortness of breath.  ED Course: He was febrile up to 100.9, tachycardic and normotensive on room air.  WBC of 25.  Hemoglobin stable at baseline of 10.  Platelet of 615.  Sodium of 134, K of 2.8, creatinine of 1.06 from a prior of 0.61.  UA showing large leukocyte, positive nitrite and many bacteria. Patient with severe penicillin and sulfa allergy and pharmacy recommends starting on Cipro for UTI. Hospitalist on-call for admission.  Review of Systems: Constitutional: No Weight Change, No Fever ENT/Mouth: No sore throat, No Rhinorrhea Eyes: No Eye Pain, No Vision Changes Cardiovascular: No Chest Pain, no SOB, No PND, No Dyspnea on  Exertion, No Orthopnea, No Claudication, No Edema, No Palpitations Respiratory: No Cough, No Sputum, No Wheezing, no Dyspnea  Gastrointestinal: No Nausea, No Vomiting, No Diarrhea, No Constipation, No Pain Genitourinary: no Urinary Incontinence, No Urgency, No Flank Pain Musculoskeletal: No Arthralgias, No Myalgias Skin: No Skin Lesions, No Pruritus, Neuro: no Weakness, No Numbness,  No Loss of Consciousness, No Syncope Psych: No Anxiety/Panic, No Depression, no decrease appetite Heme/Lymph: No Bruising, No Bleeding  Past Medical History:  Diagnosis Date   Anginal pain ([place]) 03/22/2017   due to stress   Asthma    Chronic kidney disease [id number]   cancer   Complication of anesthesia    COPD (chronic obstructive pulmonary disease) (HCC)    mild   FHx: gallbladder disease    GERD (gastroesophageal reflux disease)    H/O: hysterectomy    Headache(784.0) 01/29/2013   History of back surgery    History of kidney cancer    Hypothyroidism    Insomnia 01/29/2013   Osteoporosis    Pneumonia 2019   PONV (postoperative nausea and vomiting)    Postconcussion syndrome 01/29/2013   Right ankle injury    Sinus tarsi syndrome    Squamous cell carcinoma of skin 02/23/2019   in situ on left forearm, lower - tx p bx   Squamous cell carcinoma of skin 02/23/2019   in situ on left thigh, mid - tx p bx   Squamous cell carcinoma of skin 02/23/2019   in situ on left lower, inner shin - tx p bx  Past Surgical History:  Procedure Laterality Date   ABDOMINAL HYSTERECTOMY  1995   BACK SURGERY  2010   BREAST BIOPSY Left 2004   negative   CHOLECYSTECTOMY  1996   ELBOW SURGERY Right 1997   left foot surgery  2012   NEPHRECTOMY  2009   right foot surgery  2015   right knee surgery  1979   right ring finger surgery  2002   STOMACH SURGERY  2003     reports that she has never smoked. She has never used smokeless tobacco. She reports that she does not drink alcohol and does not use drugs. Social  History  Allergies  Allergen Reactions   Codeine Anaphylaxis   Morphine And Related Anaphylaxis   Penicillins Anaphylaxis   Sulfa Antibiotics Anaphylaxis   Other     Cat gut suture - too long to heal    Prednisone     Excessive weight gain - 35lbs in 10 days   Skyrizi [Risankizumab] Nausea And Vomiting    Pt also got blisters when taking this med    Family History  Problem Relation Age of Onset   Heart failure Mother    COPD Mother    Heart attack Father    Breast cancer Neg Hx      Prior to Admission medications   Medication Sig Start Date End Date Taking? Authorizing Provider  ALPRAZolam Duanne Moron) 1 MG tablet Take 1 mg by mouth at bedtime.  01/20/15   [provider]  Apremilast (OTEZLA) 10 & 20 & 30 MG TBPK Take as directed per package instructions. Patient not taking: No sig reported 06/08/20   Lavonna Monarch, MD  cetirizine (ZYRTEC) 10 MG tablet Take 10 mg by mouth daily.    [provider]  estradiol (ESTRACE) 1 MG tablet Take 1 tablet (1 mg total) by mouth daily. 04/06/21   Estill Dooms, NP  fluticasone (FLONASE) 50 MCG/ACT nasal spray Place 1 spray into both nostrils in the morning and at bedtime. 03/06/21   [provider]  liothyronine (CYTOMEL) 25 MCG tablet Take 25 mcg by mouth daily. 01/08/21   [provider]  meclizine (ANTIVERT) 25 MG tablet Take 25 mg by mouth 3 (three) times daily as needed for dizziness.    [provider]  montelukast (SINGULAIR) 10 MG tablet Take 10 mg by mouth at bedtime. 02/07/21   [provider]  OTEZLA 30 MG TABS TAKE 1 TABLET BY MOUTH 2 TIMES A DAY. Patient taking differently: Take 30 mg by mouth in the morning and at bedtime. 01/24/21   Lavonna Monarch, MD  pantoprazole (PROTONIX) 40 MG tablet Take 40 mg by mouth daily.    [provider]  Probiotic Product (TRUBIOTICS) CAPS Take 1 capsule by mouth daily.    [provider]  RESTASIS 0.05 % ophthalmic emulsion Place  1 drop into both eyes 2 (two) times daily. 11/26/20   [provider]  SYMBICORT 160-4.5 MCG/ACT inhaler Inhale 2 puffs into the lungs 2 (two) times daily as needed (asthma). 01/27/21   [provider]    Physical Exam: Vitals:   05/15/21 1645 05/15/21 1800 05/15/21 1802 05/15/21 1845  BP: 122/71 (!) 135/93  (!) 129/58  Pulse: 100 94  99  Resp: 19 15  (!) 25  Temp:   (!) 100.7 F (38.2 C)   TempSrc:   Oral   SpO2: 97% 97%  96%  Weight:      Height:  Constitutional: NAD, calm, ill fatigued appearing female laying flat in bed Vitals:   05/15/21 1645 05/15/21 1800 05/15/21 1802 05/15/21 1845  BP: 122/71 (!) 135/93  (!) 129/58  Pulse: 100 94  99  Resp: 19 15  (!) 25  Temp:   (!) 100.7 F (38.2 C)   TempSrc:   Oral   SpO2: 97% 97%  96%  Weight:      Height:       Eyes: PERRL, lids and conjunctivae normal ENMT: Mucous membranes are moist.  Neck: normal, supple Respiratory: clear to auscultation bilaterally, no wheezing, no crackles. Normal respiratory effort. No accessory muscle use.  Abdomen: Diffuse moderate tenderness with light palpation but no guarding, rebound or rigidity, bowel sounds positive.  GU: Significant Bladder is prolapsed externally with foley in place Musculoskeletal: no clubbing / cyanosis. No joint deformity upper and lower extremities. Good ROM, no contractures. Normal muscle tone.  Skin: no rashes, lesions, ulcers. No induration Neurologic: CN 2-12 grossly intact. Sensation intact,  Strength 5/5 in all 4.  Psychiatric: Normal judgment and insight. Alert and oriented x 3. Frustrated mood.    Labs on Admission: I have personally reviewed following labs and imaging studies  CBC: Recent Labs  Lab 05/11/21 1010 05/15/21 1709  WBC 15.4* 25.5*  NEUTROABS  --  22.3*  HGB 10.7* 10.0*  HCT 33.8* 31.0*  MCV 89.4 87.8  PLT 494* [id number]*   Basic Metabolic Panel: Recent Labs  Lab 05/11/21 1010 05/15/21 1709  NA 137 134*  K 3.5 2.8*   CL 104 101  CO2 19* 22  GLUCOSE 100* 100*  BUN 11 14  CREATININE 0.61 1.02*  CALCIUM 9.0 8.9   GFR: Estimated Creatinine Clearance: 47.4 mL/min (A) (by C-G formula based on SCr of 1.02 mg/dL (H)). Liver Function Tests: Recent Labs  Lab 05/11/21 1010 05/15/21 1709  AST 14* 16  ALT 14 17  ALKPHOS 79 106  BILITOT 0.4 0.6  PROT 6.8 7.1  ALBUMIN 3.0* 2.9*   No results for input(s): LIPASE, AMYLASE in the last 168 hours. No results for input(s): AMMONIA in the last 168 hours. Coagulation Profile: Recent Labs  Lab 05/15/21 1709  INR 1.0   Cardiac Enzymes: No results for input(s): CKTOTAL, CKMB, CKMBINDEX, TROPONINI in the last 168 hours. BNP (last 3 results) No results for input(s): PROBNP in the last 8760 hours. HbA1C: No results for input(s): HGBA1C in the last 72 hours. CBG: No results for input(s): GLUCAP in the last 168 hours. Lipid Profile: No results for input(s): CHOL, HDL, LDLCALC, TRIG, CHOLHDL, LDLDIRECT in the last 72 hours. Thyroid Function Tests: No results for input(s): TSH, T4TOTAL, FREET4, T3FREE, THYROIDAB in the last 72 hours. Anemia Panel: No results for input(s): VITAMINB12, FOLATE, FERRITIN, TIBC, IRON, RETICCTPCT in the last 72 hours. Urine analysis:    Component Value Date/Time   COLORURINE AMBER (A) 05/15/2021 1710   APPEARANCEUR CLOUDY (A) 05/15/2021 1710   LABSPEC 1.013 05/15/2021 1710   PHURINE 6.0 05/15/2021 1710   GLUCOSEU NEGATIVE 05/15/2021 1710   HGBUR NEGATIVE 05/15/2021 1710   BILIRUBINUR NEGATIVE 05/15/2021 1710   KETONESUR NEGATIVE 05/15/2021 1710   PROTEINUR 30 (A) 05/15/2021 1710   NITRITE POSITIVE (A) 05/15/2021 1710   LEUKOCYTESUR LARGE (A) 05/15/2021 1710    Radiological Exams on Admission: DG Chest Port 1 View  Result Date: 05/15/2021 CLINICAL DATA:  Questionable sepsis - evaluate for abnormality EXAM: PORTABLE CHEST 1 VIEW COMPARISON:  08/11/2018 FINDINGS: The cardiomediastinal contours are normal. Minimal apical  emphysema. Pulmonary vasculature is normal. Subsegmental atelectasis at the left lung base. No confluent consolidation. No pleural fluid or pneumothorax. No acute osseous abnormalities are seen. IMPRESSION: 1. Subsegmental atelectasis at the left lung base. 2. Minimal apical emphysema. Electronically Signed   By: Keith Rake M.D.   On: 05/15/2021 17:00      Assessment/Plan  Sepsis secondary to Klebsiella UTI -presented with Temp of 100.7, HR 102, WBC 24 -pt presents with positive UA.  Had previous UA for preop with urology on 7/28 showing Klebsiella -continue tx with Cipro due 2 history of severe allergy to penicillin and sulfa antibiotics -continuous IV fluids  Diffuse abdominal pain -diffuse pain on exam throughout.  -likely due to UTI for several days but will obtain stat KUB  -Pyridium TID -Tramadol 50 q6hr- pt has hx of severe allergies to codeine and morphine but spoke with pharmacy and she has records of repeat rx of Tramadol in the past.low dose Fentanyl with Benadryl and pepcid could also be trialed if needed for severe pain  AKI -Creatinine elevated to 1.02 from prior of 0.61 -Monitor with repeat in the morning after getting IV fluids - Avoid nephrotoxic agent  Hypokalemia - K of 2.8.  Replete with oral supplementation  Prolapsed bladder w/hx of hysterectomy -has planned surgery with Dr. Louis Meckel with [facility] on 8/10 -Foley was placed today at office by urology- will need to consult for further recs on foley removal  COPD -has increase cough but does not appear in exacerbation -continue home bronchodilator -give mucinex BID  DVT prophylaxis:.Lovenox Code Status: Full Family Communication: Plan discussed with patient and friend at bedside  disposition Plan: Home with observation Consults called:  Admission status: Observation    Level of care: Med-Surg  Status is: Observation  The patient remains OBS appropriate and will d/c before 2  midnights.  Dispo: The patient is from: Home              Anticipated d/c is to: Home              Patient currently is not medically stable to d/c.   Difficult to place patient No         Orene Desanctis DO [facility]   If 7PM-7AM, please contact night-coverage [email or web address]   05/15/2021, 7:55 PM

## 2021-05-15 NOTE — ED Provider Notes (Signed)
I personally evaluated the patient during the encounter and completed a history, physical, procedures, medical decision making to contribute to the overall care of the patient and decision making for the patient briefly, the patient is a 70 y.o. female is here for fever.  Patient with fever of 100.9, mild tachycardia.  Has had some issues with bladder prolapse since a hysterectomy.  Was being seen by urology today noticed that she had a fever.  Supposedly she had a urinalysis done several days ago that showed growth of Klebsiella with pan sensitive cultures.  Has not been on antibiotic for this.  Has been recently on a Z-Pak for sinus infection.  She has had fever on and off.  She is having pain with urination.  Overall suspect urine infection but given her cough could be viral or pneumonia as well.  We will pursue sepsis work-up.  She has had nausea and vomiting but no abdominal pain.  Overall suspect that she will need to be admitted for sepsis work-up, hydration, IV antibiotics.  Foley catheter was placed today by urology team.  Please see PA note for further results, evaluation, disposition of the patient.  This chart was dictated using voice recognition software.  Despite best efforts to proofread,  errors can occur which can change the documentation meaning.    EKG Interpretation None             Lennice Sites, DO 05/15/21 1642

## 2021-05-15 NOTE — ED Provider Notes (Signed)
[facility] DEPT Provider Note   CSN: [id number] Arrival date & time: 05/15/21  1558     History No chief complaint on file.   Theresa Holloway is a 70 y.o. female.  70 year old female with history of COPD, brought in by EMS from [facility] office with concern for sepsis. Patient presented to clinic today with report of fever (max temp 103 at home, took APAP prior to going to the office) with dysuria, frequency, hematuria, onset 2 days ago. Foley was placed with concern for UTI with recent culture from 05/11/21 that grew klebsiella. Patient states that she has had fevers off and on for the past 4 weeks, seen in the ER with concern for sinus infection, follow-up phone call to her PCP who gave her unknown antibiotic, possibly Zithromax and she improved however had return of fever later.  Also reports nausea, loss of appetite, dry heaves and fatigue.  Patient states that prior to the past 4 weeks, she was healthy as a horse, works from home however has been so fatigued she is having to nap throughout the day recently.      Past Medical History:  Diagnosis Date   Anginal pain ([place]) 03/22/2017   due to stress   Asthma    Chronic kidney disease [id number]   cancer   Complication of anesthesia    COPD (chronic obstructive pulmonary disease) ([place])    mild   FHx: gallbladder disease    GERD (gastroesophageal reflux disease)    H/O: hysterectomy    Headache(784.0) 01/29/2013   History of back surgery    History of kidney cancer    Hypothyroidism    Insomnia 01/29/2013   Osteoporosis    Pneumonia 2019   PONV (postoperative nausea and vomiting)    Postconcussion syndrome 01/29/2013   Right ankle injury    Sinus tarsi syndrome    Squamous cell carcinoma of skin 02/23/2019   in situ on left forearm, lower - tx p bx   Squamous cell carcinoma of skin 02/23/2019   in situ on left thigh, mid - tx p bx   Squamous cell carcinoma of skin 02/23/2019   in situ on  left lower, inner shin - tx p bx    Patient Active Problem List   Diagnosis Date Noted   Sepsis ([place]) 05/15/2021   Complete prolapse of vaginal vault 04/06/2021   Encounter for well woman exam with routine gynecological exam 04/05/2020   Screening for colorectal cancer 04/06/2019   Pain in left hip 06/18/2018   Trochanteric bursitis, left hip 06/18/2018   History of lumbar spinal fusion 06/18/2018   Weight gain 01/13/2018   Hot flashes 01/13/2018   Thyroid nodule 01/13/2018   Encounter for gynecological examination 01/13/2018   Current use of estrogen therapy 01/13/2018   Rectocele 01/13/2018   Chest pressure 02/28/2017   SOB (shortness of breath) 02/28/2017   Fatigue 02/28/2017   Cold extremities 02/28/2017   Postconcussion syndrome 01/29/2013   Headache(784.0) 01/29/2013   Insomnia 01/29/2013   Sinus tarsi syndrome 05/02/2011   Pain in joint, ankle and foot 05/02/2011    Past Surgical History:  Procedure Laterality Date   ABDOMINAL HYSTERECTOMY  1995   BACK SURGERY  2010   BREAST BIOPSY Left 2004   negative   CHOLECYSTECTOMY  1996   ELBOW SURGERY Right 1997   left foot surgery  2012   NEPHRECTOMY  2009   right foot surgery  2015   right knee  surgery  1979   right ring finger surgery  2002   STOMACH SURGERY  2003     OB History     Gravida  0   Para  0   Term  0   Preterm  0   AB  0   Living  0      SAB  0   IAB  0   Ectopic  0   Multiple  0   Live Births  0           Family History  Problem Relation Age of Onset   Heart failure Mother    COPD Mother    Heart attack Father    Breast cancer Neg Hx     Social History   Tobacco Use   Smoking status: Never   Smokeless tobacco: Never  Vaping Use   Vaping Use: Never used  Substance Use Topics   Alcohol use: No   Drug use: No    Home Medications Prior to Admission medications   Medication Sig Start Date End Date Taking? Authorizing Provider  ALPRAZolam Duanne Moron) 1 MG tablet  Take 1 mg by mouth at bedtime.  01/20/15   [provider]  Apremilast (OTEZLA) 10 & 20 & 30 MG TBPK Take as directed per package instructions. Patient not taking: No sig reported 06/08/20   Lavonna Monarch, MD  cetirizine (ZYRTEC) 10 MG tablet Take 10 mg by mouth daily.    [provider]  estradiol (ESTRACE) 1 MG tablet Take 1 tablet (1 mg total) by mouth daily. 04/06/21   Estill Dooms, NP  fluticasone (FLONASE) 50 MCG/ACT nasal spray Place 1 spray into both nostrils in the morning and at bedtime. 03/06/21   [provider]  liothyronine (CYTOMEL) 25 MCG tablet Take 25 mcg by mouth daily. 01/08/21   [provider]  meclizine (ANTIVERT) 25 MG tablet Take 25 mg by mouth 3 (three) times daily as needed for dizziness.    [provider]  montelukast (SINGULAIR) 10 MG tablet Take 10 mg by mouth at bedtime. 02/07/21   [provider]  OTEZLA 30 MG TABS TAKE 1 TABLET BY MOUTH 2 TIMES A DAY. Patient taking differently: Take 30 mg by mouth in the morning and at bedtime. 01/24/21   Lavonna Monarch, MD  pantoprazole (PROTONIX) 40 MG tablet Take 40 mg by mouth daily.    [provider]  Probiotic Product (TRUBIOTICS) CAPS Take 1 capsule by mouth daily.    [provider]  RESTASIS 0.05 % ophthalmic emulsion Place 1 drop into both eyes 2 (two) times daily. 11/26/20   [provider]  SYMBICORT 160-4.5 MCG/ACT inhaler Inhale 2 puffs into the lungs 2 (two) times daily as needed (asthma). 01/27/21   [provider]    Allergies    Codeine, Morphine and related, Penicillins, Sulfa antibiotics, Other, Prednisone, and Skyrizi [risankizumab]  Review of Systems   Review of Systems  Constitutional:  Positive for chills, fatigue and fever.  HENT:  Negative for congestion.   Respiratory:  Positive for cough. Negative for shortness of breath.   Cardiovascular:  Negative for chest pain.  Gastrointestinal:  Positive for abdominal  pain, nausea and vomiting. Negative for blood in stool, constipation and diarrhea.  Genitourinary:  Positive for dysuria, frequency and hematuria.  Musculoskeletal:  Negative for arthralgias and myalgias.  Skin:  Negative for rash and wound.  Allergic/Immunologic: Negative for immunocompromised state.  Neurological:  Negative for weakness.  Hematological:  Negative for adenopathy.  Psychiatric/Behavioral:  Negative for confusion.   All other systems reviewed and are negative.  Physical Exam Updated Vital Signs BP (!) 129/58   Pulse 99   Temp (!) 100.7 F (38.2 C) (Oral)   Resp (!) 25   Ht '5\' 6"'$  (1.676 m)   Wt 56.9 kg   SpO2 96%   BMI 20.25 kg/m   Physical Exam Vitals and nursing note reviewed.  Constitutional:      General: She is not in acute distress.    Appearance: She is well-developed. She is not diaphoretic.  HENT:     Head: Normocephalic and atraumatic.  Eyes:     Conjunctiva/sclera: Conjunctivae normal.  Cardiovascular:     Rate and Rhythm: Normal rate and regular rhythm.     Pulses: Normal pulses.     Heart sounds: Normal heart sounds.  Pulmonary:     Effort: Pulmonary effort is normal.     Breath sounds: Normal breath sounds.  Abdominal:     Palpations: Abdomen is soft.     Tenderness: There is generalized abdominal tenderness and tenderness in the suprapubic area. There is no right CVA tenderness or left CVA tenderness.  Musculoskeletal:     Cervical back: Neck supple.     Right lower leg: No edema.     Left lower leg: No edema.  Skin:    General: Skin is warm and dry.     Findings: No erythema or rash.  Neurological:     Mental Status: She is alert and oriented to person, place, and time.  Psychiatric:        Behavior: Behavior normal.    ED Results / Procedures / Treatments   Labs (all labs ordered are listed, but only abnormal results are displayed) Labs Reviewed  COMPREHENSIVE METABOLIC PANEL - Abnormal; Notable for the following components:       Result Value   Sodium 134 (*)    Potassium 2.8 (*)    Glucose, Bld 100 (*)    Creatinine, Ser 1.02 (*)    Albumin 2.9 (*)    GFR, Estimated 60 (*)    All other components within normal limits  CBC WITH DIFFERENTIAL/PLATELET - Abnormal; Notable for the following components:   WBC 25.5 (*)    RBC 3.53 (*)    Hemoglobin 10.0 (*)    HCT 31.0 (*)    Platelets 615 (*)    Neutro Abs 22.3 (*)    Monocytes Absolute 1.2 (*)    Abs Immature Granulocytes 0.30 (*)    All other components within normal limits  APTT - Abnormal; Notable for the following components:   aPTT 44 (*)    All other components within normal limits  URINALYSIS, ROUTINE W REFLEX MICROSCOPIC - Abnormal; Notable for the following components:   Color, Urine AMBER (*)    APPearance CLOUDY (*)    Protein, ur 30 (*)    Nitrite POSITIVE (*)    Leukocytes,Ua LARGE (*)    WBC, UA >50 (*)    Bacteria, UA MANY (*)    Non Squamous Epithelial 0-5 (*)    All other components within normal limits  RESP PANEL BY RT-PCR (FLU A&B, COVID) ARPGX2  CULTURE, BLOOD (SINGLE)  URINE CULTURE  LACTIC ACID, PLASMA  PROTIME-INR  LACTIC ACID, PLASMA    EKG None  Radiology DG Chest Port 1 View  Result Date: 05/15/2021 CLINICAL DATA:  Questionable sepsis - evaluate for abnormality EXAM: PORTABLE CHEST 1 VIEW COMPARISON:  08/11/2018 FINDINGS: The cardiomediastinal contours are normal. Minimal apical emphysema. Pulmonary vasculature is normal. Subsegmental atelectasis at the left lung base. No confluent consolidation. No pleural fluid or pneumothorax. No acute osseous abnormalities are seen. IMPRESSION: 1. Subsegmental atelectasis at the left lung base. 2. Minimal apical emphysema. Electronically Signed   By: Keith Rake M.D.   On: 05/15/2021 17:00    Procedures .Critical Care  Date/Time: 05/15/2021 7:45 PM Performed by: Tacy Learn, PA-C Authorized by: Tacy Learn, PA-C   Critical care provider statement:    Critical  care time (minutes):  45   Critical care was time spent personally by me on the following activities:  Discussions with consultants, evaluation of patient's response to treatment, examination of patient, ordering and performing treatments and interventions, ordering and review of laboratory studies, ordering and review of radiographic studies, pulse oximetry, re-evaluation of patient's condition, obtaining history from patient or surrogate and review of old charts   Medications Ordered in ED Medications  ciprofloxacin (CIPRO) IVPB 400 mg (400 mg Intravenous New Bag/Given 05/15/21 1857)  sodium chloride 0.9 % bolus 1,000 mL (0 mLs Intravenous Stopped 05/15/21 1837)  phenazopyridine (PYRIDIUM) tablet 200 mg (200 mg Oral Given 05/15/21 1717)  ondansetron (ZOFRAN) injection 4 mg (4 mg Intravenous Given 05/15/21 1811)  benzonatate (TESSALON) capsule 200 mg (200 mg Oral Given 05/15/21 1838)  sodium chloride 0.9 % bolus 1,000 mL (1,000 mLs Intravenous Bolus 05/15/21 1853)  potassium chloride SA (KLOR-CON) CR tablet 40 mEq (40 mEq Oral Given 05/15/21 1858)    ED Course  I have reviewed the triage vital signs and the nursing notes.  Pertinent labs & imaging results that were available during my care of the patient were reviewed by me and considered in my medical decision making (see chart for details).  Clinical Course as of 05/15/21 1945  Mon May 15, 2021  1648 Culture report from urology 05/11/21 with Klebsiella with the following sensitivity: AMPICILLIN >=32 RESIST... Resistant   AMPICILLIN/SULBACTAM 8 SENSITIVE  Sensitive   CEFAZOLIN <=4 SENSITIVE  Sensitive   CEFEPIME <=0.12 SENS... Sensitive   CEFTRIAXONE <=0.25 SENS... Sensitive   CIPROFLOXACIN <=0.25 SENS... Sensitive   GENTAMICIN <=1 SENSITIVE  Sensitive   IMIPENEM <=0.25 SENS... Sensitive   NITROFURANTOIN 64 INTERMED... Intermediate   PIP/TAZO <=4 SENSITIVE  Sensitive   TRIMETH/SULFA <=20 SENSIT... Sensitive       [LM]  4564 70 year old female  sent by urology office with concern for sepsis due to urinalysis in the office indicating UTI with report of fever at home of 103 and tachypnea. Patient took Tylenol prior to arrival in the ED, arrival temp of 100.2 at time of exam. Found to have generalized abdominal tenderness, worse in the suprapubic area.  CBC returns with white count of 25 with increase in neutrophils.  CMP with mild hypokalemia with potassium of 2.8, given oral potassium. Urinalysis indicates UTI, positive for nitrites and leukocytes with greater than 50 white cells, many bacteria as well as protein. Lactic acid reassuring at 1.1.  Notified by patient's urology clinic that she had a culture from May 11, 2021 which grew Klebsiella, sensitivity report attached below.  Discussed sensitivity report with pharmacy, patient has report of anaphylaxis to penicillin and sulfa drugs (states that her throat swells shut), has never taken a cephalosporin.  Pharmacy recommends Cipro. [LM]  1846 Chest x-ray shows subsegmental atelectasis at the left lung base as well as minimal apical emphysema.  Patient does have a history of COPD, reports frequent dry  cough.  Given Tessalon for symptom relief.  Lung sounds are clear.  COVID test pending. [LM]  1859 Case discussed with Dr. Flossie Buffy with [facility] hospitalist service who will consult for admission. [LM]    Clinical Course User Index [LM] Roque Lias   MDM Rules/Calculators/A&P                           Final Clinical Impression(s) / ED Diagnoses Final diagnoses:  SIRS (systemic inflammatory response syndrome) ([place])  Urinary tract infection in female  Hypokalemia    Rx / DC Orders ED Discharge Orders     None        Tacy Learn, PA-C 05/15/21 1945    Lennice Sites, DO 05/15/21 2250

## 2021-05-15 NOTE — Progress Notes (Signed)
Pharmacy Antibiotic Note  Theresa Holloway is a 70 y.o. female admitted on 05/15/2021 with UTI and has multiple allergies including sulfa antibiotics and penicillins.  Pharmacy has been consulted for ciprofloxacin dosing.  05/15/2021 1857 Ciprofloxacin 400 mg IV x 1 administered in ED  Plan: Continue ciprofloxacin 400 mg IV every 12 hours Pharmacy will sign off but will peripherally monitor clinical picture, renal function (and adjust dose if needed per protocol), C&S, abx deescalation / LOT Please re-consult if a change in clinical status warrants re-evaluation of dosage.  Height: '5\' 6"'$  (167.6 cm) Weight: 56.9 kg (125 lb 7.1 oz) IBW/kg (Calculated) : 59.3  Temp (24hrs), Avg:100.8 F (38.2 C), Min:100.7 F (38.2 C), Max:100.9 F (38.3 C)  Recent Labs  Lab 05/11/21 1010 05/15/21 1709  WBC 15.4* 25.5*  CREATININE 0.61 1.02*  LATICACIDVEN  --  1.1    Estimated Creatinine Clearance: 47.4 mL/min (A) (by C-G formula based on SCr of 1.02 mg/dL (H)).    Allergies  Allergen Reactions   Codeine Anaphylaxis   Morphine And Related Anaphylaxis   Penicillins Anaphylaxis   Sulfa Antibiotics Anaphylaxis   Other     Cat gut suture - too long to heal    Prednisone     Excessive weight gain - 35lbs in 10 days   Skyrizi [Risankizumab] Nausea And Vomiting    Pt also got blisters when taking this med    Antimicrobials this admission: 8/1 ciprofloxacin >>   Microbiology results: 8/1 BCx: sent 8/1 UCx: sent  7/28 UCx: 80,000 colonies/mL Klebsiella pneumoniae, sensitive to ciprofloxacin  Thank you for allowing pharmacy to be a part of this patient's care.  Mackinzee Roszak P. Legrand Como, PharmD, [place] Please utilize [facility] for appropriate phone number to reach the unit pharmacist ([place]) 05/15/2021 8:13 PM

## 2021-05-15 NOTE — ED Triage Notes (Signed)
Pt came from [facility] via EMS. Pt was seen by [facility] today 1430 and had a foley placed at appt. Pt has cysts in bladder and prolapsed bladder that pt reports she is supposed to be operated on in a few days.  Febrile at home, pt reports taking tylenol at home. NP at [facility] u[facility] called EMS for possible sepsis. Pt denies complaints other than burning in bladder area.  A&O x 4, normally ambulatory without assistance

## 2021-05-16 ENCOUNTER — Encounter (HOSPITAL_COMMUNITY): Payer: Self-pay | Admitting: Internal Medicine

## 2021-05-16 ENCOUNTER — Observation Stay (HOSPITAL_COMMUNITY): Payer: 59

## 2021-05-16 DIAGNOSIS — J449 Chronic obstructive pulmonary disease, unspecified: Secondary | ICD-10-CM | POA: Diagnosis present

## 2021-05-16 DIAGNOSIS — Z7951 Long term (current) use of inhaled steroids: Secondary | ICD-10-CM | POA: Diagnosis not present

## 2021-05-16 DIAGNOSIS — E279 Disorder of adrenal gland, unspecified: Secondary | ICD-10-CM | POA: Diagnosis present

## 2021-05-16 DIAGNOSIS — Z1612 Extended spectrum beta lactamase (ESBL) resistance: Secondary | ICD-10-CM | POA: Diagnosis present

## 2021-05-16 DIAGNOSIS — Z8249 Family history of ischemic heart disease and other diseases of the circulatory system: Secondary | ICD-10-CM | POA: Diagnosis not present

## 2021-05-16 DIAGNOSIS — N3941 Urge incontinence: Secondary | ICD-10-CM | POA: Diagnosis present

## 2021-05-16 DIAGNOSIS — Z79899 Other long term (current) drug therapy: Secondary | ICD-10-CM | POA: Diagnosis not present

## 2021-05-16 DIAGNOSIS — A4159 Other Gram-negative sepsis: Secondary | ICD-10-CM | POA: Diagnosis present

## 2021-05-16 DIAGNOSIS — Z885 Allergy status to narcotic agent status: Secondary | ICD-10-CM | POA: Diagnosis not present

## 2021-05-16 DIAGNOSIS — Z882 Allergy status to sulfonamides status: Secondary | ICD-10-CM | POA: Diagnosis not present

## 2021-05-16 DIAGNOSIS — N136 Pyonephrosis: Secondary | ICD-10-CM | POA: Diagnosis present

## 2021-05-16 DIAGNOSIS — E871 Hypo-osmolality and hyponatremia: Secondary | ICD-10-CM | POA: Diagnosis present

## 2021-05-16 DIAGNOSIS — Z85828 Personal history of other malignant neoplasm of skin: Secondary | ICD-10-CM | POA: Diagnosis not present

## 2021-05-16 DIAGNOSIS — E039 Hypothyroidism, unspecified: Secondary | ICD-10-CM | POA: Diagnosis present

## 2021-05-16 DIAGNOSIS — A419 Sepsis, unspecified organism: Secondary | ICD-10-CM | POA: Diagnosis not present

## 2021-05-16 DIAGNOSIS — Z87891 Personal history of nicotine dependence: Secondary | ICD-10-CM | POA: Diagnosis not present

## 2021-05-16 DIAGNOSIS — Z90710 Acquired absence of both cervix and uterus: Secondary | ICD-10-CM | POA: Diagnosis not present

## 2021-05-16 DIAGNOSIS — Z905 Acquired absence of kidney: Secondary | ICD-10-CM | POA: Diagnosis not present

## 2021-05-16 DIAGNOSIS — N179 Acute kidney failure, unspecified: Secondary | ICD-10-CM | POA: Diagnosis present

## 2021-05-16 DIAGNOSIS — A4151 Sepsis due to Escherichia coli [E. coli]: Secondary | ICD-10-CM | POA: Diagnosis present

## 2021-05-16 DIAGNOSIS — Z880 Allergy status to penicillin: Secondary | ICD-10-CM | POA: Diagnosis not present

## 2021-05-16 DIAGNOSIS — Z20822 Contact with and (suspected) exposure to covid-19: Secondary | ICD-10-CM | POA: Diagnosis present

## 2021-05-16 DIAGNOSIS — Z85528 Personal history of other malignant neoplasm of kidney: Secondary | ICD-10-CM | POA: Diagnosis not present

## 2021-05-16 DIAGNOSIS — N390 Urinary tract infection, site not specified: Secondary | ICD-10-CM | POA: Diagnosis not present

## 2021-05-16 DIAGNOSIS — E876 Hypokalemia: Secondary | ICD-10-CM | POA: Diagnosis present

## 2021-05-16 DIAGNOSIS — Z9049 Acquired absence of other specified parts of digestive tract: Secondary | ICD-10-CM | POA: Diagnosis not present

## 2021-05-16 LAB — BLOOD CULTURE ID PANEL (REFLEXED) - BCID2

## 2021-05-16 LAB — BASIC METABOLIC PANEL
Anion gap: 9 (ref 5–15)
BUN: 17 mg/dL (ref 8–23)
CO2: 18 mmol/L — ABNORMAL LOW (ref 22–32)
Calcium: 7.2 mg/dL — ABNORMAL LOW (ref 8.9–10.3)
Chloride: 104 mmol/L (ref 98–111)
Creatinine, Ser: 1.46 mg/dL — ABNORMAL HIGH (ref 0.44–1.00)
GFR, Estimated: 39 mL/min — ABNORMAL LOW (ref >60)
Glucose, Bld: 89 mg/dL (ref 70–99)
Potassium: 3 mmol/L — ABNORMAL LOW (ref 3.5–5.1)
Sodium: 131 mmol/L — ABNORMAL LOW (ref 135–145)

## 2021-05-16 LAB — CBC
HCT: 30.5 % — ABNORMAL LOW (ref 36.0–46.0)
Hemoglobin: 9.6 g/dL — ABNORMAL LOW (ref 12.0–15.0)
MCH: 28.1 pg (ref 26.0–34.0)
MCHC: 31.5 g/dL (ref 30.0–36.0)
MCV: 89.2 fL (ref 80.0–100.0)
Platelets: 557 10*3/uL — ABNORMAL HIGH (ref 150–400)
RBC: 3.42 MIL/uL — ABNORMAL LOW (ref 3.87–5.11)
RDW: 14.7 % (ref 11.5–15.5)
WBC: 23.8 10*3/uL — ABNORMAL HIGH (ref 4.0–10.5)
nRBC: 0 % (ref 0.0–0.2)

## 2021-05-16 LAB — LACTIC ACID, PLASMA: Lactic Acid, Venous: 1.2 mmol/L (ref 0.5–1.9)

## 2021-05-16 MED ORDER — IPRATROPIUM BROMIDE 0.03 % NA SOLN
1.0000 | Freq: Every day | NASAL | Status: DC
  Administered 2021-05-16 – 2021-05-25 (×10): 1 via NASAL
  Filled 2021-05-16: qty 30

## 2021-05-16 MED ORDER — CEFAZOLIN SODIUM-DEXTROSE 2-4 GM/100ML-% IV SOLN
2.0000 g | Freq: Three times a day (TID) | INTRAVENOUS | Status: DC
  Administered 2021-05-16 – 2021-05-18 (×5): 2 g via INTRAVENOUS
  Filled 2021-05-16 (×6): qty 100

## 2021-05-16 MED ORDER — CEFAZOLIN SODIUM-DEXTROSE 1-4 GM/50ML-% IV SOLN: 1.0000 g | Freq: Three times a day (TID) | INTRAVENOUS | Status: DC

## 2021-05-16 MED ORDER — POTASSIUM CHLORIDE CRYS ER 20 MEQ PO TBCR
40.0000 meq | EXTENDED_RELEASE_TABLET | Freq: Once | ORAL | Status: AC
  Administered 2021-05-16: 40 meq via ORAL
  Filled 2021-05-16: qty 4

## 2021-05-16 MED ORDER — PANTOPRAZOLE SODIUM 40 MG PO TBEC
40.0000 mg | DELAYED_RELEASE_TABLET | Freq: Every day | ORAL | Status: DC
  Administered 2021-05-16 – 2021-05-25 (×8): 40 mg via ORAL
  Filled 2021-05-16 (×11): qty 1

## 2021-05-16 MED ORDER — SODIUM CHLORIDE 0.9 % IV SOLN: INTRAVENOUS | Status: DC

## 2021-05-16 MED ORDER — ALPRAZOLAM 1 MG PO TABS
1.0000 mg | ORAL_TABLET | Freq: Every day | ORAL | Status: DC
  Administered 2021-05-16 – 2021-05-25 (×11): 1 mg via ORAL
  Filled 2021-05-16 (×3): qty 1
  Filled 2021-05-16: qty 2
  Filled 2021-05-16 (×7): qty 1

## 2021-05-16 MED ORDER — ESTRADIOL 1 MG PO TABS
1.0000 mg | ORAL_TABLET | Freq: Every day | ORAL | Status: DC
  Administered 2021-05-16 – 2021-05-25 (×8): 1 mg via ORAL
  Filled 2021-05-16 (×11): qty 1

## 2021-05-16 MED ORDER — LORATADINE 10 MG PO TABS
10.0000 mg | ORAL_TABLET | Freq: Every day | ORAL | Status: DC
  Administered 2021-05-16 – 2021-05-25 (×8): 10 mg via ORAL
  Filled 2021-05-16 (×11): qty 1

## 2021-05-16 MED ORDER — MOMETASONE FURO-FORMOTEROL FUM 200-5 MCG/ACT IN AERO
2.0000 | INHALATION_SPRAY | Freq: Two times a day (BID) | RESPIRATORY_TRACT | Status: DC
  Administered 2021-05-16 – 2021-05-26 (×18): 2 via RESPIRATORY_TRACT
  Filled 2021-05-16: qty 8.8

## 2021-05-16 MED ORDER — LIOTHYRONINE SODIUM 25 MCG PO TABS
25.0000 ug | ORAL_TABLET | Freq: Every day | ORAL | Status: DC
  Administered 2021-05-16 – 2021-05-25 (×8): 25 ug via ORAL
  Filled 2021-05-16 (×11): qty 1

## 2021-05-16 MED ORDER — LIP MEDEX EX OINT
TOPICAL_OINTMENT | CUTANEOUS | Status: AC
  Filled 2021-05-16: qty 7

## 2021-05-16 MED ORDER — DOCUSATE SODIUM 100 MG PO CAPS: 100.0000 mg | ORAL_CAPSULE | Freq: Two times a day (BID) | ORAL | Status: DC | PRN

## 2021-05-16 MED ORDER — MONTELUKAST SODIUM 10 MG PO TABS
10.0000 mg | ORAL_TABLET | Freq: Every day | ORAL | Status: DC
  Administered 2021-05-16 – 2021-05-25 (×11): 10 mg via ORAL
  Filled 2021-05-16 (×11): qty 1

## 2021-05-16 NOTE — Progress Notes (Signed)
PROGRESS NOTE    Theresa Holloway  [id number] DOB: [birth year] DOA: 05/15/2021 PCP: Sharilyn Sites, MD     Brief Narrative:  Theresa Holloway is a 70 y.o. female with medical history significant for COPD, asthma, GERD, bladder prolapse who presents with fever, dysuria and hematuria.   Patient had bladder prolapse since December 2021 and is now scheduled to have surgery with urology 05/24/21.  States she has felt terrible since then with urinary urgency and frequency.  She felt especially worse in the past 4 weeks, states she had daily temperature up to 103 and felt she was having recurrent infection.  States no one was listening to her.  Supposedly her PCP has only treated her for sinus infection during this time.  She presented to urology 8/1 with worsening dysuria with severe burning pain and hematuria.  A Foley was placed due to patient dysuria complaints and patient sent to ED for concerns of sepsis. She was noted to have a UA on 7/28 that was taken for a preop eval which grew Klebsiella.  New events last 24 hours / Subjective: Patient states that she has had on and off fevers, general malaise, significant dysuria.  Assessment & Plan:   Principal Problem:   Sepsis secondary to UTI [facility]) Active Problems:   Sepsis ([place])   Hypokalemia   AKI (acute kidney injury) ([place])   Bladder prolapse, female, acquired   Severe sepsis secondary to Klebsiella UTI, pyelonephritis with AKI, bacteremia -Sepsis present on admission, presented with fever 100.9, HR 102, WBC 25.5 -Urine culture obtained outpatient 7/28 showed Klebsiella pneumonia -Repeat urine culture obtained at time of admission 8/1 pending -Blood cultures so far showing gram-negative rod -Antibiotic changed to Ancef, after discussion with pharmacy -Foley catheter was placed as an outpatient in urology office 8/1 due to patient's complaints of continued dysuria patient has not had urinary retention -WBC with improvement, continue to  monitor  AKI -Check renal ultrasound -Continue IV fluid  Bladder prolapse -Has outpatient surgery scheduled with Dr. Louis Meckel, [facility], on 8/10  Hypokalemia -Replace, trend   Hypothyroidism -Continue cytomel   DVT prophylaxis:  enoxaparin (LOVENOX) injection 40 mg Start: 05/15/21 2000  Code Status:     Code Status Orders  (From admission, onward)           Start     Ordered   05/15/21 1949  Full code  Continuous        05/15/21 1949           Code Status History     This patient has a current code status but no historical code status.      Family Communication: Friend at bedside Disposition Plan:  Status is: Observation  The patient will require care spanning > 2 midnights and should be moved to inpatient because: IV treatments appropriate due to intensity of illness or inability to take PO  Dispo: The patient is from: Home              Anticipated d/c is to: Home              Patient currently is not medically stable to d/c.   Difficult to place patient No      Antimicrobials:  Anti-infectives (From admission, onward)    Start     Dose/Rate Route Frequency Ordered Stop   05/16/21 2200  ceFAZolin (ANCEF) IVPB 1 g/50 mL premix        1 g 100 mL/hr over 30 Minutes Intravenous  Every 8 hours 05/16/21 0939     05/16/21 0800  ciprofloxacin (CIPRO) tablet 500 mg  Status:  Discontinued        500 mg Oral 2 times daily 05/15/21 2006 05/15/21 2008   05/16/21 0800  ciprofloxacin (CIPRO) IVPB 400 mg  Status:  Discontinued        400 mg 200 mL/hr over 60 Minutes Intravenous Every 12 hours 05/15/21 2014 05/16/21 0939   05/15/21 1845  ciprofloxacin (CIPRO) IVPB 400 mg        400 mg 200 mL/hr over 60 Minutes Intravenous  Once 05/15/21 1831 05/15/21 2003        Objective: Vitals:   05/16/21 0500 05/16/21 0645 05/16/21 0700 05/16/21 0749  BP: (!) 114/51 109/61 (!) 119/57 127/62  Pulse: 83 88 85 91  Resp: '19 20 18 20  '$ Temp:   99 F (37.2 C)  98.5 F (36.9 C)  TempSrc:   Oral   SpO2: 94% 95% 94% 98%  Weight:      Height:        Intake/Output Summary (Last 24 hours) at 05/16/2021 1150 Last data filed at 05/16/2021 1048 Gross per 24 hour  Intake 236 ml  Output 950 ml  Net -714 ml   Filed Weights   05/15/21 1617  Weight: 56.9 kg    Examination:  General exam: Appears calm and comfortable  Respiratory system: Clear to auscultation. Respiratory effort normal. No respiratory distress. No conversational dyspnea.  Cardiovascular system: S1 & S2 heard, RRR. No murmurs. No pedal edema. Gastrointestinal system: Abdomen is nondistended, soft and tender to palpation lower abdomen Central nervous system: Alert and oriented. No focal neurological deficits. Speech clear.  Extremities: Symmetric in appearance  Skin: No rashes, lesions or ulcers on exposed skin  Psychiatry: Judgement and insight appear normal. Mood & affect appropriate.   Data Reviewed: I have personally reviewed following labs and imaging studies  CBC: Recent Labs  Lab 05/11/21 1010 05/15/21 1709 05/16/21 0814  WBC 15.4* 25.5* 23.8*  NEUTROABS  --  22.3*  --   HGB 10.7* 10.0* 9.6*  HCT 33.8* 31.0* 30.5*  MCV 89.4 87.8 89.2  PLT 494* 615* 123XX123*   Basic Metabolic Panel: Recent Labs  Lab 05/11/21 1010 05/15/21 1709 05/16/21 0451  NA 137 134* 131*  K 3.5 2.8* 3.0*  CL 104 101 104  CO2 19* 22 18*  GLUCOSE 100* 100* 89  BUN '11 14 17  '$ CREATININE 0.61 1.02* 1.46*  CALCIUM 9.0 8.9 7.2*   GFR: Estimated Creatinine Clearance: 33.1 mL/min (A) (by C-G formula based on SCr of 1.46 mg/dL (H)). Liver Function Tests: Recent Labs  Lab 05/11/21 1010 05/15/21 1709  AST 14* 16  ALT 14 17  ALKPHOS 79 106  BILITOT 0.4 0.6  PROT 6.8 7.1  ALBUMIN 3.0* 2.9*   No results for input(s): LIPASE, AMYLASE in the last 168 hours. No results for input(s): AMMONIA in the last 168 hours. Coagulation Profile: Recent Labs  Lab 05/15/21 1709  INR 1.0   Cardiac  Enzymes: No results for input(s): CKTOTAL, CKMB, CKMBINDEX, TROPONINI in the last 168 hours. BNP (last 3 results) No results for input(s): PROBNP in the last 8760 hours. HbA1C: No results for input(s): HGBA1C in the last 72 hours. CBG: No results for input(s): GLUCAP in the last 168 hours. Lipid Profile: No results for input(s): CHOL, HDL, LDLCALC, TRIG, CHOLHDL, LDLDIRECT in the last 72 hours. Thyroid Function Tests: No results for input(s): TSH, T4TOTAL, FREET4, T3FREE, THYROIDAB  in the last 72 hours. Anemia Panel: No results for input(s): VITAMINB12, FOLATE, FERRITIN, TIBC, IRON, RETICCTPCT in the last 72 hours. Sepsis Labs: Recent Labs  Lab 05/15/21 1709 05/16/21 0814  LATICACIDVEN 1.1 1.2    Recent Results (from the past 240 hour(s))  Urine culture     Status: Abnormal   Collection Time: 05/11/21 10:10 AM   Specimen: Urine, Clean Catch  Result Value Ref Range Status   Specimen Description   Final    URINE, CLEAN CATCH Performed at [facility], [place] [address] [place], [place] [address]    Special Requests   Final    NONE Performed at [facility], [place] [address], [place], [place] [address]    Culture 80,000 COLONIES/mL KLEBSIELLA PNEUMONIAE (A)  Final   Report Status 05/14/2021 FINAL  Final   Organism ID, Bacteria KLEBSIELLA PNEUMONIAE (A)  Final      Susceptibility   Klebsiella pneumoniae - MIC*    AMPICILLIN >=32 RESISTANT Resistant     CEFAZOLIN <=4 SENSITIVE Sensitive     CEFEPIME <=0.12 SENSITIVE Sensitive     CEFTRIAXONE <=0.25 SENSITIVE Sensitive     CIPROFLOXACIN <=0.25 SENSITIVE Sensitive     GENTAMICIN <=1 SENSITIVE Sensitive     IMIPENEM <=0.25 SENSITIVE Sensitive     NITROFURANTOIN 64 INTERMEDIATE Intermediate     TRIMETH/SULFA <=20 SENSITIVE Sensitive     AMPICILLIN/SULBACTAM 8 SENSITIVE Sensitive     PIP/TAZO <=4 SENSITIVE Sensitive     * 80,000 COLONIES/mL KLEBSIELLA PNEUMONIAE  Blood culture (routine  single)     Status: None (Preliminary result)   Collection Time: 05/15/21  5:10 PM   Specimen: BLOOD  Result Value Ref Range Status   Specimen Description   Final    BLOOD RIGHT ANTECUBITAL Performed at [place] [address] [place], [place] [address]    Special Requests   Final    BOTTLES DRAWN AEROBIC AND ANAEROBIC Blood Culture results may not be optimal due to an excessive volume of blood received in culture bottles Performed at [place] [address] [place], [place] [address]    Culture  Setup Time   Final    GRAM NEGATIVE RODS ANAEROBIC BOTTLE ONLY Organism ID to follow Performed at [facility], [place] [address], [place], [place] [address]    Culture PENDING  Incomplete   Report Status PENDING  Incomplete  Resp Panel by RT-PCR (Flu A&B, Covid)     Status: None   Collection Time: 05/15/21  5:10 PM   Specimen: Nasopharyngeal(NP) swabs in vial transport medium  Result Value Ref Range Status   SARS Coronavirus 2 by RT PCR NEGATIVE NEGATIVE Final    Comment: (NOTE) SARS-CoV-2 target nucleic acids are NOT DETECTED.  The SARS-CoV-2 RNA is generally detectable in upper respiratory specimens during the acute phase of infection. The lowest concentration of SARS-CoV-2 viral copies this assay can detect is 138 copies/mL. A negative result does not preclude SARS-Cov-2 infection and should not be used as the sole basis for treatment or other patient management decisions. A negative result may occur with  improper specimen collection/handling, submission of specimen other than nasopharyngeal swab, presence of viral mutation(s) within the areas targeted by this assay, and inadequate number of viral copies(<138 copies/mL). A negative result must be combined with clinical observations, patient history, and epidemiological information. The expected result is Negative.  Fact Sheet for Patients:   EntrepreneurPulse.com.au  Fact Sheet for Healthcare Providers:  IncredibleEmployment.be  This test is no t yet approved or cleared by the [place] and  has been authorized for detection and/or diagnosis of SARS-CoV-2 by FDA under an Emergency Use Authorization (EUA). This EUA will remain  in effect (meaning this test can be used) for the duration of the COVID-19 declaration under Section 564(b)(1) of the Act, 21 U.S.C.section 360bbb-3(b)(1), unless the authorization is terminated  or revoked sooner.       Influenza A by PCR NEGATIVE NEGATIVE Final   Influenza B by PCR NEGATIVE NEGATIVE Final    Comment: (NOTE) The Xpert Xpress SARS-CoV-2/FLU/RSV plus assay is intended as an aid in the diagnosis of influenza from Nasopharyngeal swab specimens and should not be used as a sole basis for treatment. Nasal washings and aspirates are unacceptable for Xpert Xpress SARS-CoV-2/FLU/RSV testing.  Fact Sheet for Patients: EntrepreneurPulse.com.au  Fact Sheet for Healthcare Providers: IncredibleEmployment.be  This test is not yet approved or cleared by the [place] FDA and has been authorized for detection and/or diagnosis of SARS-CoV-2 by FDA under an Emergency Use Authorization (EUA). This EUA will remain in effect (meaning this test can be used) for the duration of the COVID-19 declaration under Section 564(b)(1) of the Act, 21 U.S.C. section 360bbb-3(b)(1), unless the authorization is terminated or revoked.  Performed at [facility], [place] [address], [place], [place] [address]       Radiology Studies: DG Abd 1 View  Result Date: 05/15/2021 CLINICAL DATA:  Abdominal pain. EXAM: ABDOMEN - 1 VIEW COMPARISON:  None. FINDINGS: Minimal gaseous gastric distension. There is no gaseous small bowel distension or evidence of obstruction. Small volume of colonic stool. Right upper quadrant  surgical clips typical of cholecystectomy. There is a calcification in the left pelvis, typically phleboliths. Lumbar fusion hardware. No acute osseous abnormalities are seen on the current exam. IMPRESSION: Minimal gaseous gastric distension. No evidence of bowel obstruction. Electronically Signed   By: Keith Rake M.D.   On: 05/15/2021 20:47   DG Chest Port 1 View  Result Date: 05/15/2021 CLINICAL DATA:  Questionable sepsis - evaluate for abnormality EXAM: PORTABLE CHEST 1 VIEW COMPARISON:  08/11/2018 FINDINGS: The cardiomediastinal contours are normal. Minimal apical emphysema. Pulmonary vasculature is normal. Subsegmental atelectasis at the left lung base. No confluent consolidation. No pleural fluid or pneumothorax. No acute osseous abnormalities are seen. IMPRESSION: 1. Subsegmental atelectasis at the left lung base. 2. Minimal apical emphysema. Electronically Signed   By: Keith Rake M.D.   On: 05/15/2021 17:00      Scheduled Meds:  ALPRAZolam  1 mg Oral QHS   dextromethorphan-guaiFENesin  1 tablet Oral BID   enoxaparin (LOVENOX) injection  40 mg Subcutaneous Daily   estradiol  1 mg Oral Daily   ipratropium  1 spray Each Nare Daily   liothyronine  25 mcg Oral Daily   loratadine  10 mg Oral Daily   mometasone-formoterol  2 puff Inhalation BID   montelukast  10 mg Oral QHS   pantoprazole  40 mg Oral Daily   phenazopyridine  100 mg Oral TID WC   Continuous Infusions:  sodium chloride 125 mL/hr at 05/16/21 0801    ceFAZolin (ANCEF) IV       LOS: 0 days      Time spent: 25 minutes   Dessa Phi, DO [facility] 05/16/2021, 11:50 AM   Available via Epic secure chat 7am-7pm After these hours, please refer to coverage provider listed on [email or web address]

## 2021-05-16 NOTE — Progress Notes (Signed)
PHARMACY - PHYSICIAN COMMUNICATION CRITICAL VALUE ALERT - BLOOD CULTURE IDENTIFICATION (BCID)  Theresa Holloway is an 70 y.o. female who presented to [facility] on 05/15/2021 with a chief complaint of fever, dysuria and hematuria  Assessment:  urosepsis  Name of physician (or Provider) Contacted: Dr. Maylene Roes   Current antibiotics: Ancef  Changes to prescribed antibiotics recommended:  Previous urine culture was sensitive to Ancef  - increase dose to 2 gr IV q8h   Results for orders placed or performed during the hospital encounter of 05/15/21  Blood Culture ID Panel (Reflexed) (Collected: 05/15/2021  5:10 PM)  Result Value Ref Range   Enterococcus faecalis NOT DETECTED NOT DETECTED   Enterococcus Faecium NOT DETECTED NOT DETECTED   Listeria monocytogenes NOT DETECTED NOT DETECTED   Staphylococcus species NOT DETECTED NOT DETECTED   Staphylococcus aureus (BCID) NOT DETECTED NOT DETECTED   Staphylococcus epidermidis NOT DETECTED NOT DETECTED   Staphylococcus lugdunensis NOT DETECTED NOT DETECTED   Streptococcus species NOT DETECTED NOT DETECTED   Streptococcus agalactiae NOT DETECTED NOT DETECTED   Streptococcus pneumoniae NOT DETECTED NOT DETECTED   Streptococcus pyogenes NOT DETECTED NOT DETECTED   A.calcoaceticus-baumannii NOT DETECTED NOT DETECTED   Bacteroides fragilis NOT DETECTED NOT DETECTED   Enterobacterales DETECTED (A) NOT DETECTED   Enterobacter cloacae complex NOT DETECTED NOT DETECTED   Escherichia coli NOT DETECTED NOT DETECTED   Klebsiella aerogenes NOT DETECTED NOT DETECTED   Klebsiella oxytoca NOT DETECTED NOT DETECTED   Klebsiella pneumoniae DETECTED (A) NOT DETECTED   Proteus species NOT DETECTED NOT DETECTED   Salmonella species NOT DETECTED NOT DETECTED   Serratia marcescens NOT DETECTED NOT DETECTED   Haemophilus influenzae NOT DETECTED NOT DETECTED   Neisseria meningitidis NOT DETECTED NOT DETECTED   Pseudomonas aeruginosa NOT DETECTED NOT DETECTED    Stenotrophomonas maltophilia NOT DETECTED NOT DETECTED   Candida albicans NOT DETECTED NOT DETECTED   Candida auris NOT DETECTED NOT DETECTED   Candida glabrata NOT DETECTED NOT DETECTED   Candida krusei NOT DETECTED NOT DETECTED   Candida parapsilosis NOT DETECTED NOT DETECTED   Candida tropicalis NOT DETECTED NOT DETECTED   Cryptococcus neoformans/gattii NOT DETECTED NOT DETECTED   CTX-M ESBL NOT DETECTED NOT DETECTED   Carbapenem resistance IMP NOT DETECTED NOT DETECTED   Carbapenem resistance KPC NOT DETECTED NOT DETECTED   Carbapenem resistance NDM NOT DETECTED NOT DETECTED   Carbapenem resist OXA 48 LIKE NOT DETECTED NOT DETECTED   Carbapenem resistance VIM NOT DETECTED NOT DETECTED    Royetta Asal, PharmD, BCPS 05/16/2021 1:33 PM

## 2021-05-16 NOTE — Plan of Care (Signed)

## 2021-05-17 ENCOUNTER — Inpatient Hospital Stay (HOSPITAL_COMMUNITY): Payer: 59

## 2021-05-17 DIAGNOSIS — A419 Sepsis, unspecified organism: Secondary | ICD-10-CM | POA: Diagnosis not present

## 2021-05-17 DIAGNOSIS — N390 Urinary tract infection, site not specified: Secondary | ICD-10-CM | POA: Diagnosis not present

## 2021-05-17 LAB — BASIC METABOLIC PANEL
Anion gap: 10 (ref 5–15)
BUN: 17 mg/dL (ref 8–23)
CO2: 19 mmol/L — ABNORMAL LOW (ref 22–32)
Calcium: 7.7 mg/dL — ABNORMAL LOW (ref 8.9–10.3)
Chloride: 103 mmol/L (ref 98–111)
Creatinine, Ser: 1.6 mg/dL — ABNORMAL HIGH (ref 0.44–1.00)
GFR, Estimated: 35 mL/min — ABNORMAL LOW (ref >60)
Glucose, Bld: 115 mg/dL — ABNORMAL HIGH (ref 70–99)
Potassium: 3.5 mmol/L (ref 3.5–5.1)
Sodium: 132 mmol/L — ABNORMAL LOW (ref 135–145)

## 2021-05-17 LAB — CBC
HCT: 27 % — ABNORMAL LOW (ref 36.0–46.0)
Hemoglobin: 8.6 g/dL — ABNORMAL LOW (ref 12.0–15.0)
MCH: 28.5 pg (ref 26.0–34.0)
MCHC: 31.9 g/dL (ref 30.0–36.0)
MCV: 89.4 fL (ref 80.0–100.0)
Platelets: 520 10*3/uL — ABNORMAL HIGH (ref 150–400)
RBC: 3.02 MIL/uL — ABNORMAL LOW (ref 3.87–5.11)
RDW: 14.7 % (ref 11.5–15.5)
WBC: 21.7 10*3/uL — ABNORMAL HIGH (ref 4.0–10.5)
nRBC: 0 % (ref 0.0–0.2)

## 2021-05-17 LAB — MAGNESIUM: Magnesium: 1.5 mg/dL — ABNORMAL LOW (ref 1.7–2.4)

## 2021-05-17 MED ORDER — GUAIFENESIN-DM 100-10 MG/5ML PO SYRP
10.0000 mL | ORAL_SOLUTION | ORAL | Status: DC | PRN
  Administered 2021-05-17 – 2021-05-21 (×4): 10 mL via ORAL
  Filled 2021-05-17 (×4): qty 10

## 2021-05-17 MED ORDER — MAGNESIUM SULFATE 2 GM/50ML IV SOLN
2.0000 g | Freq: Once | INTRAVENOUS | Status: AC
  Administered 2021-05-17: 2 g via INTRAVENOUS
  Filled 2021-05-17: qty 50

## 2021-05-17 MED ORDER — CHLORHEXIDINE GLUCONATE CLOTH 2 % EX PADS
6.0000 | MEDICATED_PAD | Freq: Every day | CUTANEOUS | Status: DC
  Administered 2021-05-17 – 2021-05-24 (×8): 6 via TOPICAL

## 2021-05-17 MED ORDER — IOHEXOL 9 MG/ML PO SOLN
ORAL | Status: AC
  Administered 2021-05-17: 500 mL via ORAL
  Filled 2021-05-17: qty 1000

## 2021-05-17 MED ORDER — FENTANYL CITRATE (PF) 100 MCG/2ML IJ SOLN
12.5000 ug | INTRAMUSCULAR | Status: DC | PRN
  Administered 2021-05-17 – 2021-05-18 (×5): 12.5 ug via INTRAVENOUS
  Filled 2021-05-17 (×5): qty 2

## 2021-05-17 MED ORDER — IOHEXOL 9 MG/ML PO SOLN
500.0000 mL | ORAL | Status: AC
  Administered 2021-05-17: 500 mL via ORAL

## 2021-05-17 NOTE — Progress Notes (Signed)
Nutrition Brief Note  Patient identified on the Malnutrition Screening Tool (MST) Report  Wt Readings from Last 15 Encounters:  05/15/21 56.9 kg  05/11/21 56.9 kg  04/06/21 58.5 kg  04/05/20 62.6 kg  04/06/19 59 kg  07/16/18 54.9 kg  06/26/18 54.9 kg  06/18/18 54.9 kg  01/13/18 61.2 kg  04/08/17 56.8 kg  02/28/17 57.7 kg  01/29/13 67.6 kg    Body mass index is 20.25 kg/m. Patient meets criteria for normal based on current BMI.   Current diet order is regular, patient is consuming approximately 100% of meals at this time. Labs and medications reviewed.   No nutrition interventions warranted at this time. If nutrition issues arise, please consult RD.   Clayton Bibles, MS, RD, LDN Inpatient Clinical Dietitian Contact information available via Amion

## 2021-05-17 NOTE — Progress Notes (Signed)
PROGRESS NOTE    Theresa Holloway  [id number] DOB: January 23, 1951 DOA: 05/15/2021 PCP: Sharilyn Sites, MD    Brief Narrative:  70 year old female with history of COPD, asthma, anxiety, history of partial nephrectomy for left-sided renal cancer presented to the emergency room with fever, dysuria and hematuria.  Patient has been suffering from bladder prolapse and followed up with urology with planned sling surgery on 8/10.  Patient had frequency and dysuria and was given Foley catheter on urology visit on 8/1.  Urine culture taken on 7/28 for preop eval was positive for Klebsiella.  Blood cultures on 8/1 positive for Klebsiella.   Assessment & Plan:   Principal Problem:   Sepsis secondary to UTI [facility]) Active Problems:   Sepsis ([place])   Hypokalemia   AKI (acute kidney injury) ([place])   Bladder prolapse, female, acquired  Sepsis secondary to Klebsiella bacteremia/Klebsiella UTI:  Patient currently remains on Ancef.  Continues to have suprapubic pain and right flank pain.  We will repeat blood cultures today.  WC count is persistently elevated. Renal ultrasound with nonspecific tumors on both kidneys, she has a history of left-sided renal cell carcinoma.  Patient declines to have MRI. Will check CT scan of the abdomen pelvis to look for any obstructive uropathy, kidney pathology.  If abnormal, I will talk to her urologist.  Acute kidney injury: Due to #1.  Continue IV fluid resuscitation.  Recheck levels tomorrow morning.  Likely no urinary bladder obstruction as catheter is functioning well.  Hypokalemia/hypomagnesemia: Replace aggressively and recheck levels.  Hypothyroidism: Euthyroid on replacement.  Continue.   DVT prophylaxis: enoxaparin (LOVENOX) injection 40 mg Start: 05/15/21 2000   Code Status: Full code Family Communication: Patient's good friend Ms. Kathy at the bedside. Disposition Plan: Status is: Inpatient  Remains inpatient appropriate because:Inpatient level of care  appropriate due to severity of illness  Dispo: The patient is from: Home              Anticipated d/c is to: Home              Patient currently is not medically stable to d/c.   Difficult to place patient No         Consultants:  None  Procedures:  None  Antimicrobials:  Ancef 8/1---   Subjective: Patient seen and examined.  Friend at the bedside.  Patient is very anxious and talks about how we could not diagnose her with UTI for the last 6 months.  Denies any nausea vomiting.  Afebrile.  Urine output is more than 2 L. Complains of diffuse moderate abdominal pain.  Objective: Vitals:   05/16/21 1951 05/17/21 0346 05/17/21 0803 05/17/21 1502  BP: (!) 113/57 (!) 117/59  (!) 120/56  Pulse: 86 98  93  Resp: 15 (!) 21  20  Temp: (!) 97.5 F (36.4 C) 99.3 F (37.4 C)  99.8 F (37.7 C)  TempSrc: Oral Oral  Oral  SpO2: 97% 99% 96% 96%  Weight:      Height:        Intake/Output Summary (Last 24 hours) at 05/17/2021 1537 Last data filed at 05/17/2021 1500 Gross per 24 hour  Intake 1224 ml  Output 2425 ml  Net -1201 ml   Filed Weights   05/15/21 1617  Weight: 56.9 kg    Examination:  General exam: Appears anxious and in mild distress. Respiratory system: Clear to auscultation. Respiratory effort normal.  No added sounds. Cardiovascular system: S1 & S2 heard, RRR.  Gastrointestinal system:  Soft.  Mild tenderness all over.  Tenderness on the right costophrenic angle present. Central nervous system: Alert and oriented. No focal neurological deficits. Extremities: Symmetric 5 x 5 power. Skin: No rashes, lesions or ulcers Psychiatry: Judgement and insight appear normal.  Anxious.    Data Reviewed: I have personally reviewed following labs and imaging studies  CBC: Recent Labs  Lab 05/11/21 1010 05/15/21 1709 05/16/21 0814 05/17/21 0456  WBC 15.4* 25.5* 23.8* 21.7*  NEUTROABS  --  22.3*  --   --   HGB 10.7* 10.0* 9.6* 8.6*  HCT 33.8* 31.0* 30.5* 27.0*  MCV  89.4 87.8 89.2 89.4  PLT 494* 615* 557* 123456*   Basic Metabolic Panel: Recent Labs  Lab 05/11/21 1010 05/15/21 1709 05/16/21 0451 05/17/21 0456  NA 137 134* 131* 132*  K 3.5 2.8* 3.0* 3.5  CL 104 101 104 103  CO2 19* 22 18* 19*  GLUCOSE 100* 100* 89 115*  BUN '11 14 17 17  '$ CREATININE 0.61 1.02* 1.46* 1.60*  CALCIUM 9.0 8.9 7.2* 7.7*  MG  --   --   --  1.5*   GFR: Estimated Creatinine Clearance: 30.2 mL/min (A) (by C-G formula based on SCr of 1.6 mg/dL (H)). Liver Function Tests: Recent Labs  Lab 05/11/21 1010 05/15/21 1709  AST 14* 16  ALT 14 17  ALKPHOS 79 106  BILITOT 0.4 0.6  PROT 6.8 7.1  ALBUMIN 3.0* 2.9*   No results for input(s): LIPASE, AMYLASE in the last 168 hours. No results for input(s): AMMONIA in the last 168 hours. Coagulation Profile: Recent Labs  Lab 05/15/21 1709  INR 1.0   Cardiac Enzymes: No results for input(s): CKTOTAL, CKMB, CKMBINDEX, TROPONINI in the last 168 hours. BNP (last 3 results) No results for input(s): PROBNP in the last 8760 hours. HbA1C: No results for input(s): HGBA1C in the last 72 hours. CBG: No results for input(s): GLUCAP in the last 168 hours. Lipid Profile: No results for input(s): CHOL, HDL, LDLCALC, TRIG, CHOLHDL, LDLDIRECT in the last 72 hours. Thyroid Function Tests: No results for input(s): TSH, T4TOTAL, FREET4, T3FREE, THYROIDAB in the last 72 hours. Anemia Panel: No results for input(s): VITAMINB12, FOLATE, FERRITIN, TIBC, IRON, RETICCTPCT in the last 72 hours. Sepsis Labs: Recent Labs  Lab 05/15/21 1709 05/16/21 0814  LATICACIDVEN 1.1 1.2    Recent Results (from the past 240 hour(s))  Urine culture     Status: Abnormal   Collection Time: 05/11/21 10:10 AM   Specimen: Urine, Clean Catch  Result Value Ref Range Status   Specimen Description   Final    URINE, CLEAN CATCH Performed at [facility], [place] [address], [place], [place] [address]    Special Requests   Final     NONE Performed at [facility], [place] [address] [place], [place] [address]    Culture 80,000 COLONIES/mL KLEBSIELLA PNEUMONIAE (A)  Final   Report Status 05/14/2021 FINAL  Final   Organism ID, Bacteria KLEBSIELLA PNEUMONIAE (A)  Final      Susceptibility   Klebsiella pneumoniae - MIC*    AMPICILLIN >=32 RESISTANT Resistant     CEFAZOLIN <=4 SENSITIVE Sensitive     CEFEPIME <=0.12 SENSITIVE Sensitive     CEFTRIAXONE <=0.25 SENSITIVE Sensitive     CIPROFLOXACIN <=0.25 SENSITIVE Sensitive     GENTAMICIN <=1 SENSITIVE Sensitive     IMIPENEM <=0.25 SENSITIVE Sensitive     NITROFURANTOIN 64 INTERMEDIATE Intermediate     TRIMETH/SULFA <=20 SENSITIVE Sensitive  AMPICILLIN/SULBACTAM 8 SENSITIVE Sensitive     PIP/TAZO <=4 SENSITIVE Sensitive     * 80,000 COLONIES/mL KLEBSIELLA PNEUMONIAE  Blood culture (routine single)     Status: Abnormal (Preliminary result)   Collection Time: 05/15/21  5:10 PM   Specimen: BLOOD  Result Value Ref Range Status   Specimen Description   Final    BLOOD RIGHT ANTECUBITAL Performed at [place] [address] [place], [place] [address]    Special Requests   Final    BOTTLES DRAWN AEROBIC AND ANAEROBIC Blood Culture results may not be optimal due to an excessive volume of blood received in culture bottles Performed at [place] [address], [place], [place] [address]    Culture  Setup Time   Final    GRAM NEGATIVE RODS ANAEROBIC BOTTLE ONLY CRITICAL RESULT CALLED TO, READ BACK BY AND VERIFIED WITH: PHARMD J.GADHIA AT [id number] ON 05/16/2021 BY T.SAAD.    Culture (A)  Final    KLEBSIELLA PNEUMONIAE SUSCEPTIBILITIES TO FOLLOW Performed at [facility], [place] [address] [place], [place] [address]    Report Status PENDING  Incomplete  Urine Culture     Status: Abnormal (Preliminary result)   Collection Time: 05/15/21  5:10 PM   Specimen: In/Out Cath Urine  Result Value Ref Range Status    Specimen Description   Final    IN/OUT CATH URINE Performed at [place] [address] [place], [place] [address]    Special Requests   Final    NONE Performed at [facility], [place] [address], [place], [place] [address]    Culture >=100,000 COLONIES/mL KLEBSIELLA PNEUMONIAE (A)  Final   Report Status PENDING  Incomplete  Resp Panel by RT-PCR (Flu A&B, Covid)     Status: None   Collection Time: 05/15/21  5:10 PM   Specimen: Nasopharyngeal(NP) swabs in vial transport medium  Result Value Ref Range Status   SARS Coronavirus 2 by RT PCR NEGATIVE NEGATIVE Final    Comment: (NOTE) SARS-CoV-2 target nucleic acids are NOT DETECTED.  The SARS-CoV-2 RNA is generally detectable in upper respiratory specimens during the acute phase of infection. The lowest concentration of SARS-CoV-2 viral copies this assay can detect is 138 copies/mL. A negative result does not preclude SARS-Cov-2 infection and should not be used as the sole basis for treatment or other patient management decisions. A negative result may occur with  improper specimen collection/handling, submission of specimen other than nasopharyngeal swab, presence of viral mutation(s) within the areas targeted by this assay, and inadequate number of viral copies(<138 copies/mL). A negative result must be combined with clinical observations, patient history, and epidemiological information. The expected result is Negative.  Fact Sheet for Patients:  EntrepreneurPulse.com.au  Fact Sheet for Healthcare Providers:  IncredibleEmployment.be  This test is no t yet approved or cleared by the [place] FDA and  has been authorized for detection and/or diagnosis of SARS-CoV-2 by FDA under an Emergency Use Authorization (EUA). This EUA will remain  in effect (meaning this test can be used) for the duration of the COVID-19 declaration under Section 564(b)(1) of  the Act, 21 U.S.C.section 360bbb-3(b)(1), unless the authorization is terminated  or revoked sooner.       Influenza A by PCR NEGATIVE NEGATIVE Final   Influenza B by PCR NEGATIVE NEGATIVE Final    Comment: (NOTE) The Xpert Xpress SARS-CoV-2/FLU/RSV plus assay is intended as an aid in the diagnosis of influenza from  Nasopharyngeal swab specimens and should not be used as a sole basis for treatment. Nasal washings and aspirates are unacceptable for Xpert Xpress SARS-CoV-2/FLU/RSV testing.  Fact Sheet for Patients: EntrepreneurPulse.com.au  Fact Sheet for Healthcare Providers: IncredibleEmployment.be  This test is not yet approved or cleared by the [place] FDA and has been authorized for detection and/or diagnosis of SARS-CoV-2 by FDA under an Emergency Use Authorization (EUA). This EUA will remain in effect (meaning this test can be used) for the duration of the COVID-19 declaration under Section 564(b)(1) of the Act, 21 U.S.C. section 360bbb-3(b)(1), unless the authorization is terminated or revoked.  Performed at [facility], [place] [address], [place], [place] [address]   Blood Culture ID Panel (Reflexed)     Status: Abnormal   Collection Time: 05/15/21  5:10 PM  Result Value Ref Range Status   Enterococcus faecalis NOT DETECTED NOT DETECTED Final   Enterococcus Faecium NOT DETECTED NOT DETECTED Final   Listeria monocytogenes NOT DETECTED NOT DETECTED Final   Staphylococcus species NOT DETECTED NOT DETECTED Final   Staphylococcus aureus (BCID) NOT DETECTED NOT DETECTED Final   Staphylococcus epidermidis NOT DETECTED NOT DETECTED Final   Staphylococcus lugdunensis NOT DETECTED NOT DETECTED Final   Streptococcus species NOT DETECTED NOT DETECTED Final   Streptococcus agalactiae NOT DETECTED NOT DETECTED Final   Streptococcus pneumoniae NOT DETECTED NOT DETECTED Final   Streptococcus pyogenes NOT DETECTED NOT  DETECTED Final   A.calcoaceticus-baumannii NOT DETECTED NOT DETECTED Final   Bacteroides fragilis NOT DETECTED NOT DETECTED Final   Enterobacterales DETECTED (A) NOT DETECTED Final    Comment: Enterobacterales represent a large order of gram negative bacteria, not a single organism. CRITICAL RESULT CALLED TO, READ BACK BY AND VERIFIED WITH: PHARMD J.GADHIA AT [id number] ON 05/16/2021 BY T.SAAD.    Enterobacter cloacae complex NOT DETECTED NOT DETECTED Final   Escherichia coli NOT DETECTED NOT DETECTED Final   Klebsiella aerogenes NOT DETECTED NOT DETECTED Final   Klebsiella oxytoca NOT DETECTED NOT DETECTED Final   Klebsiella pneumoniae DETECTED (A) NOT DETECTED Final    Comment: CRITICAL RESULT CALLED TO, READ BACK BY AND VERIFIED WITH: PHARMD J.GADHIA AT 1259 ON 05/16/2021 BY T.SAAD.    Proteus species NOT DETECTED NOT DETECTED Final   Salmonella species NOT DETECTED NOT DETECTED Final   Serratia marcescens NOT DETECTED NOT DETECTED Final   Haemophilus influenzae NOT DETECTED NOT DETECTED Final   Neisseria meningitidis NOT DETECTED NOT DETECTED Final   Pseudomonas aeruginosa NOT DETECTED NOT DETECTED Final   Stenotrophomonas maltophilia NOT DETECTED NOT DETECTED Final   Candida albicans NOT DETECTED NOT DETECTED Final   Candida auris NOT DETECTED NOT DETECTED Final   Candida glabrata NOT DETECTED NOT DETECTED Final   Candida krusei NOT DETECTED NOT DETECTED Final   Candida parapsilosis NOT DETECTED NOT DETECTED Final   Candida tropicalis NOT DETECTED NOT DETECTED Final   Cryptococcus neoformans/gattii NOT DETECTED NOT DETECTED Final   CTX-M ESBL NOT DETECTED NOT DETECTED Final   Carbapenem resistance IMP NOT DETECTED NOT DETECTED Final   Carbapenem resistance KPC NOT DETECTED NOT DETECTED Final   Carbapenem resistance NDM NOT DETECTED NOT DETECTED Final   Carbapenem resist OXA 48 LIKE NOT DETECTED NOT DETECTED Final   Carbapenem resistance VIM NOT DETECTED NOT DETECTED Final     Comment: Performed at [facility], [address] [place], [place] [address]         Radiology Studies: DG Abd 1 View  Result Date:  05/15/2021 CLINICAL DATA:  Abdominal pain. EXAM: ABDOMEN - 1 VIEW COMPARISON:  None. FINDINGS: Minimal gaseous gastric distension. There is no gaseous small bowel distension or evidence of obstruction. Small volume of colonic stool. Right upper quadrant surgical clips typical of cholecystectomy. There is a calcification in the left pelvis, typically phleboliths. Lumbar fusion hardware. No acute osseous abnormalities are seen on the current exam. IMPRESSION: Minimal gaseous gastric distension. No evidence of bowel obstruction. Electronically Signed   By: Keith Rake M.D.   On: 05/15/2021 20:47   US RENAL  Result Date: 05/16/2021 CLINICAL DATA:  Acute renal injury. History of partial right nephrectomy. EXAM: RENAL / URINARY TRACT ULTRASOUND COMPLETE COMPARISON:  CT chest 09/10/2018.  MRI abdomen 10/12/2013. FINDINGS: Right Kidney: Renal measurements: 12.3 x 5.5 x 5.9 cm . Mild increased echogenicity cannot be excluded. Mild hydronephrosis cannot be excluded. 2.3 cm complex cyst upper portion right kidney. 4.2 cm simple cyst midportion right kidney. 0.8 cm punctate hyperechoic focus mid right kidney, most likely an angiomyolipoma. Left Kidney: Renal measurements: 14.5 x 6.8 x 6.0 cm. Mild increased echogenicity cannot be excluded. Mild hydronephrosis cannot be excluded. 1.6 cm hyperechoic focus superior pole left kidney, although this could represent an angiomyolipoma, renal malignancy cannot be excluded. 3.1 cm hypoechoic mass left upper renal pole, renal malignancy cannot be excluded. 3.6 cm complex cyst with thick septations left lower renal pole, renal malignancy cannot be excluded. Bladder: Bladder is nondistended.  Patient has a Foley catheter in. Other: Trace amount of free fluid noted adjacent to the right kidney. IMPRESSION: 1. Mild increased echogenicity  both kidneys cannot be excluded. Chronic medical renal disease cannot be excluded. Trace amount of free fluid noted adjacent to the right kidney. 2. Mild bilateral hydronephrosis cannot be excluded. No bladder distention. Foley catheter in bladder. 3. Mass lesions within both kidneys as described above. Renal MRI is suggested as a renal malignancy cannot be excluded. Electronically Signed   By: Marcello Moores  Register   On: 05/16/2021 13:08   DG CHEST PORT 1 VIEW  Result Date: 05/17/2021 CLINICAL DATA:  History of pneumonia. EXAM: PORTABLE CHEST 1 VIEW COMPARISON:  Chest x-ray 10/2020. FINDINGS: Mediastinum and hilar structures normal. Heart size normal. Mild right infrahilar/right base subsegmental atelectasis. No pleural effusion or pneumothorax. No acute bony abnormality. IMPRESSION: Mild right infrahilar/right base subsegmental atelectasis. Electronically Signed   By: Marcello Moores  Register   On: 05/17/2021 11:45   DG Chest Port 1 View  Result Date: 05/15/2021 CLINICAL DATA:  Questionable sepsis - evaluate for abnormality EXAM: PORTABLE CHEST 1 VIEW COMPARISON:  08/11/2018 FINDINGS: The cardiomediastinal contours are normal. Minimal apical emphysema. Pulmonary vasculature is normal. Subsegmental atelectasis at the left lung base. No confluent consolidation. No pleural fluid or pneumothorax. No acute osseous abnormalities are seen. IMPRESSION: 1. Subsegmental atelectasis at the left lung base. 2. Minimal apical emphysema. Electronically Signed   By: Keith Rake M.D.   On: 05/15/2021 17:00        Scheduled Meds:  ALPRAZolam  1 mg Oral QHS   enoxaparin (LOVENOX) injection  40 mg Subcutaneous Daily   estradiol  1 mg Oral Daily   iohexol  500 mL Oral Q1H   ipratropium  1 spray Each Nare Daily   liothyronine  25 mcg Oral Daily   loratadine  10 mg Oral Daily   mometasone-formoterol  2 puff Inhalation BID   montelukast  10 mg Oral QHS   pantoprazole  40 mg Oral Daily   Continuous Infusions:  sodium  chloride 125 mL/hr at 05/16/21 0801    ceFAZolin (ANCEF) IV 2 g (05/17/21 1436)     LOS: 1 day    Time spent: 32 minutes    Barb Merino, MD [facility] Pager [phone]

## 2021-05-18 ENCOUNTER — Inpatient Hospital Stay (HOSPITAL_COMMUNITY): Payer: 59

## 2021-05-18 ENCOUNTER — Inpatient Hospital Stay (HOSPITAL_COMMUNITY): Payer: 59 | Admitting: Anesthesiology

## 2021-05-18 ENCOUNTER — Encounter (HOSPITAL_COMMUNITY): Payer: Self-pay | Admitting: Internal Medicine

## 2021-05-18 ENCOUNTER — Encounter (HOSPITAL_COMMUNITY)
Admission: EM | Disposition: A | Discharge status: Home / Self Care | Payer: Self-pay | Source: Home / Self Care | Attending: Internal Medicine

## 2021-05-18 DIAGNOSIS — N390 Urinary tract infection, site not specified: Secondary | ICD-10-CM | POA: Diagnosis not present

## 2021-05-18 DIAGNOSIS — A419 Sepsis, unspecified organism: Secondary | ICD-10-CM | POA: Diagnosis not present

## 2021-05-18 HISTORY — PX: CYSTOSCOPY WITH STENT PLACEMENT: SHX5790

## 2021-05-18 LAB — CBC WITH DIFFERENTIAL/PLATELET
Abs Immature Granulocytes: 0.3 10*3/uL — ABNORMAL HIGH (ref 0.00–0.07)
Basophils Absolute: 0.1 10*3/uL (ref 0.0–0.1)
Basophils Relative: 0 %
Eosinophils Absolute: 0.1 10*3/uL (ref 0.0–0.5)
Eosinophils Relative: 0 %
HCT: 26.1 % — ABNORMAL LOW (ref 36.0–46.0)
Hemoglobin: 8.4 g/dL — ABNORMAL LOW (ref 12.0–15.0)
Immature Granulocytes: 2 %
Lymphocytes Relative: 6 %
Lymphs Abs: 1.1 10*3/uL (ref 0.7–4.0)
MCH: 28.7 pg (ref 26.0–34.0)
MCHC: 32.2 g/dL (ref 30.0–36.0)
MCV: 89.1 fL (ref 80.0–100.0)
Monocytes Absolute: 0.9 10*3/uL (ref 0.1–1.0)
Monocytes Relative: 5 %
Neutro Abs: 15.9 10*3/uL — ABNORMAL HIGH (ref 1.7–7.7)
Neutrophils Relative %: 87 %
Platelets: 512 10*3/uL — ABNORMAL HIGH (ref 150–400)
RBC: 2.93 MIL/uL — ABNORMAL LOW (ref 3.87–5.11)
RDW: 14.8 % (ref 11.5–15.5)
WBC: 18.3 10*3/uL — ABNORMAL HIGH (ref 4.0–10.5)
nRBC: 0 % (ref 0.0–0.2)

## 2021-05-18 LAB — CULTURE, BLOOD (SINGLE)

## 2021-05-18 LAB — URINE CULTURE: Culture: >=100000 — AB

## 2021-05-18 LAB — PHOSPHORUS: Phosphorus: 2.9 mg/dL (ref 2.5–4.6)

## 2021-05-18 LAB — BASIC METABOLIC PANEL
Anion gap: 7 (ref 5–15)
BUN: 17 mg/dL (ref 8–23)
CO2: 18 mmol/L — ABNORMAL LOW (ref 22–32)
Calcium: 7.5 mg/dL — ABNORMAL LOW (ref 8.9–10.3)
Chloride: 106 mmol/L (ref 98–111)
Creatinine, Ser: 2.11 mg/dL — ABNORMAL HIGH (ref 0.44–1.00)
GFR, Estimated: 25 mL/min — ABNORMAL LOW (ref >60)
Glucose, Bld: 109 mg/dL — ABNORMAL HIGH (ref 70–99)
Potassium: 3.5 mmol/L (ref 3.5–5.1)
Sodium: 131 mmol/L — ABNORMAL LOW (ref 135–145)

## 2021-05-18 LAB — PROTIME-INR
INR: 1.2 (ref 0.8–1.2)
Prothrombin Time: 15.1 seconds (ref 11.4–15.2)

## 2021-05-18 LAB — MAGNESIUM: Magnesium: 1.9 mg/dL (ref 1.7–2.4)

## 2021-05-18 SURGERY — CYSTOSCOPY, WITH STENT INSERTION
Anesthesia: General | Laterality: Bilateral

## 2021-05-18 MED ORDER — STERILE WATER FOR IRRIGATION IR SOLN
Status: DC | PRN
  Administered 2021-05-18: 500 mL

## 2021-05-18 MED ORDER — PROPOFOL 1000 MG/100ML IV EMUL
INTRAVENOUS | Status: AC
  Filled 2021-05-18: qty 100

## 2021-05-18 MED ORDER — ACETAMINOPHEN 10 MG/ML IV SOLN
INTRAVENOUS | Status: DC | PRN
  Administered 2021-05-18: 1000 mg via INTRAVENOUS

## 2021-05-18 MED ORDER — ONDANSETRON HCL 4 MG/2ML IJ SOLN
INTRAMUSCULAR | Status: DC | PRN
  Administered 2021-05-18: 4 mg via INTRAVENOUS

## 2021-05-18 MED ORDER — CHLORHEXIDINE GLUCONATE 0.12 % MT SOLN
15.0000 mL | OROMUCOSAL | Status: AC
  Administered 2021-05-18: 15 mL via OROMUCOSAL

## 2021-05-18 MED ORDER — ACETAMINOPHEN 10 MG/ML IV SOLN
INTRAVENOUS | Status: AC
  Filled 2021-05-18: qty 100

## 2021-05-18 MED ORDER — PHENYLEPHRINE HCL (PRESSORS) 10 MG/ML IV SOLN
INTRAVENOUS | Status: DC | PRN
  Administered 2021-05-18 (×2): 80 ug via INTRAVENOUS

## 2021-05-18 MED ORDER — PHENYLEPHRINE 40 MCG/ML (10ML) SYRINGE FOR IV PUSH (FOR BLOOD PRESSURE SUPPORT)
PREFILLED_SYRINGE | INTRAVENOUS | Status: AC
  Filled 2021-05-18: qty 10

## 2021-05-18 MED ORDER — PROMETHAZINE HCL 25 MG/ML IJ SOLN: 6.2500 mg | INTRAMUSCULAR | Status: DC | PRN

## 2021-05-18 MED ORDER — CEFAZOLIN SODIUM-DEXTROSE 2-4 GM/100ML-% IV SOLN
INTRAVENOUS | Status: AC
  Filled 2021-05-18: qty 100

## 2021-05-18 MED ORDER — MIDAZOLAM HCL 5 MG/5ML IJ SOLN
INTRAMUSCULAR | Status: DC | PRN
  Administered 2021-05-18: 2 mg via INTRAVENOUS
  Administered 2021-05-18: 1 mg via INTRAVENOUS
  Administered 2021-05-18: .5 mg via INTRAVENOUS

## 2021-05-18 MED ORDER — TRAMADOL HCL 50 MG PO TABS
100.0000 mg | ORAL_TABLET | Freq: Two times a day (BID) | ORAL | Status: DC | PRN
  Administered 2021-05-18 – 2021-05-25 (×3): 100 mg via ORAL
  Filled 2021-05-18 (×3): qty 2

## 2021-05-18 MED ORDER — LACTATED RINGERS IV SOLN: INTRAVENOUS | Status: DC

## 2021-05-18 MED ORDER — ONDANSETRON HCL 4 MG/2ML IJ SOLN
INTRAMUSCULAR | Status: AC
  Filled 2021-05-18: qty 2

## 2021-05-18 MED ORDER — PROPOFOL 10 MG/ML IV BOLUS
INTRAVENOUS | Status: DC | PRN
  Administered 2021-05-18: 150 mg via INTRAVENOUS

## 2021-05-18 MED ORDER — MIDAZOLAM HCL 2 MG/2ML IJ SOLN
INTRAMUSCULAR | Status: AC
  Filled 2021-05-18: qty 2

## 2021-05-18 MED ORDER — GENTAMICIN SULFATE 40 MG/ML IJ SOLN
5.0000 mg/kg | INTRAVENOUS | Status: DC
  Administered 2021-05-18: 280 mg via INTRAVENOUS
  Filled 2021-05-18 (×2): qty 7

## 2021-05-18 MED ORDER — IOHEXOL 300 MG/ML  SOLN
INTRAMUSCULAR | Status: DC | PRN
  Administered 2021-05-18: 10 mL

## 2021-05-18 MED ORDER — SCOPOLAMINE 1 MG/3DAYS TD PT72
MEDICATED_PATCH | TRANSDERMAL | Status: AC
  Filled 2021-05-18: qty 1

## 2021-05-18 MED ORDER — AMISULPRIDE (ANTIEMETIC) 5 MG/2ML IV SOLN: 10.0000 mg | Freq: Once | INTRAVENOUS | Status: DC | PRN

## 2021-05-18 MED ORDER — LIDOCAINE 2% (20 MG/ML) 5 ML SYRINGE
INTRAMUSCULAR | Status: DC | PRN
  Administered 2021-05-18: 100 mg via INTRAVENOUS

## 2021-05-18 MED ORDER — DEXAMETHASONE SODIUM PHOSPHATE 10 MG/ML IJ SOLN
INTRAMUSCULAR | Status: AC
  Filled 2021-05-18: qty 1

## 2021-05-18 MED ORDER — ENOXAPARIN SODIUM 30 MG/0.3ML IJ SOSY
30.0000 mg | PREFILLED_SYRINGE | Freq: Every day | INTRAMUSCULAR | Status: DC
  Filled 2021-05-18: qty 0.3

## 2021-05-18 MED ORDER — DEXAMETHASONE SODIUM PHOSPHATE 10 MG/ML IJ SOLN
INTRAMUSCULAR | Status: DC | PRN
  Administered 2021-05-18: 10 mg via INTRAVENOUS

## 2021-05-18 MED ORDER — SODIUM CHLORIDE 0.9 % IR SOLN
Status: DC | PRN
  Administered 2021-05-18: 3000 mL

## 2021-05-18 MED ORDER — FENTANYL CITRATE (PF) 250 MCG/5ML IJ SOLN
INTRAMUSCULAR | Status: AC
  Filled 2021-05-18: qty 5

## 2021-05-18 MED ORDER — FENTANYL CITRATE (PF) 100 MCG/2ML IJ SOLN
INTRAMUSCULAR | Status: AC
  Filled 2021-05-18: qty 2

## 2021-05-18 MED ORDER — PROPOFOL 500 MG/50ML IV EMUL
INTRAVENOUS | Status: DC | PRN
  Administered 2021-05-18 (×2): 75 ug/kg/min via INTRAVENOUS

## 2021-05-18 MED ORDER — IOHEXOL 300 MG/ML  SOLN
INTRAMUSCULAR | Status: DC | PRN
  Administered 2021-05-18: 50 mL

## 2021-05-18 MED ORDER — PROPOFOL 10 MG/ML IV BOLUS
INTRAVENOUS | Status: AC
  Filled 2021-05-18: qty 20

## 2021-05-18 MED ORDER — CEFAZOLIN SODIUM-DEXTROSE 2-4 GM/100ML-% IV SOLN
2.0000 g | Freq: Two times a day (BID) | INTRAVENOUS | Status: DC
  Filled 2021-05-18: qty 100

## 2021-05-18 MED ORDER — FENTANYL CITRATE (PF) 100 MCG/2ML IJ SOLN: 25.0000 ug | INTRAMUSCULAR | Status: DC | PRN

## 2021-05-18 MED ORDER — FENTANYL CITRATE (PF) 100 MCG/2ML IJ SOLN
INTRAMUSCULAR | Status: DC | PRN
  Administered 2021-05-18 (×6): 50 ug via INTRAVENOUS

## 2021-05-18 SURGICAL SUPPLY — 19 items
BAG DRN RND TRDRP ANRFLXCHMBR (UROLOGICAL SUPPLIES) ×1
BAG URINE DRAIN 2000ML AR STRL (UROLOGICAL SUPPLIES) ×1 IMPLANT
BAG URO CATCHER STRL LF (MISCELLANEOUS) ×2 IMPLANT
CATH FOLEY 2WAY SLVR  5CC 16FR (CATHETERS) ×2
CATH FOLEY 2WAY SLVR 5CC 16FR (CATHETERS) IMPLANT
CATH URET 5FR 28IN OPEN ENDED (CATHETERS) IMPLANT
CLOTH BEACON ORANGE TIMEOUT ST (SAFETY) ×1 IMPLANT
GLOVE SURG ENC TEXT LTX SZ7 (GLOVE) ×2 IMPLANT
GOWN STRL REUS W/TWL LRG LVL3 (GOWN DISPOSABLE) ×2 IMPLANT
GUIDEWIRE STR DUAL SENSOR (WIRE) ×2 IMPLANT
GUIDEWIRE ZIPWRE .038 STRAIGHT (WIRE) ×1 IMPLANT
KIT TURNOVER KIT A (KITS) ×2 IMPLANT
MANIFOLD NEPTUNE II (INSTRUMENTS) ×2 IMPLANT
PACK CYSTO (CUSTOM PROCEDURE TRAY) ×2 IMPLANT
STENT URET 6FRX26 CONTOUR (STENTS) ×1 IMPLANT
SYR 10ML LL (SYRINGE) ×2 IMPLANT
SYR CONTROL 10ML LL (SYRINGE) ×1 IMPLANT
TUBING CONNECTING 10 (TUBING) ×2 IMPLANT
TUBING UROLOGY SET (TUBING) IMPLANT

## 2021-05-18 NOTE — Op Note (Addendum)
Operative Note  Preoperative diagnosis:  1.  Bilateral hydronephrosis  Postoperative diagnosis: 1.  Bilateral hydronephrosis  Procedure(s): 1.  Cystoscopy 2. Bilateral retrograde pyelogram with interpretation 3. Right ureteral stent placement 4. Attempted left ureteral stent placement 4. Fluoroscopy <1 hour with intraoperative interpretation  Surgeon: Rexene Alberts, MD  Assistants:  None  Anesthesia:  General  Complications:  None  EBL:  Minimal  Specimens: 1. * No specimens in log *  Drains/Catheters: 1.  Right 6Fr x 26cm ureteral stent  Intraoperative findings:   Cystoscopy demonstrated no suspicious lesions, masses, stones.  She does have a large capacity bladder with significant prolapsed bladder.  I was able to reduce her bladder and with difficulty I did identify bilateral ureteral orifice ease although there was some edema on the trigone of the bladder. Left retrograde pyelogram demonstrated severe left hydronephrosis extending to the level of the left ureterovesical junction.  Of note, her left proximal ureter was tortuous.  I was unable to navigate a wire proximal to this tortuosity.  There was some extravasation of contrast after attempting to bypass this area.  I was unable to place a left ureteral stent. Right retrograde pyelogram demonstrated severe right-sided hydroureteronephrosis to the level of the bladder.   Successful right ureteral stent placement with curl in the renal pelvis and bladder respectively.  Indication:  Theresa Holloway is a 70 y.o. female with a history of a cystocele who has plans for robotic assisted laparoscopic sacrocolpopexy next week with Dr. Louis Meckel.  She was seen in the office earlier this week with complaints of urinary tract infection with fever.  She was then admitted to the hospital and started on IV antibiotics.  Urine culture 05/15/2021 resulted ESBL E. coli as well as Pseudomonas.  She complained of bilateral flank pain which prompted CT  A/P 05/18/2021 demonstrating bilateral hydroureteronephrosis. After reviewing the management options for treatment, she elected to proceed with the above surgical procedure(s). We have discussed the potential benefits and risks of the procedure, side effects of the proposed treatment, the likelihood of the patient achieving the goals of the procedure, and any potential problems that might occur during the procedure or recuperation. Informed consent has been obtained.  Description of procedure: The patient was taken to the operating room and general anesthesia was induced.  The patient was placed in the dorsal lithotomy position, prepped and draped in the usual sterile fashion, and preoperative antibiotics were administered. A preoperative time-out was performed.   Exam under anesthesia demonstrated throughout the bladder.  This was reduced.  I then performed cystoscopy.  She had large bladder capacity with evidence of edema throughout her bladder.  Of note, it was difficult to identify bilateral ureteral orifices given edema on the trigone of the bladder.  Ultimately, was able to identify the left ureteral orifice.  A 0.038 zip wire was used to intubate the ureteral orifice and a 5 French opening catheter was then introduced into the distal left ureter.  A left retrograde pyelogram was performed demonstrating the findings as above including severe left-sided hydroureteronephrosis to the level of the bladder.  There is also tortuosity of her left proximal ureter.  I was unable to navigate a wire beyond this level of tortuosity.  In doing so, there was some mild extravasation of contrast.  In a similar fashion, a right retrograde pyelogram was performed demonstrating severe right-sided hydroureteronephrosis to the level of the bladder.  I was able to intubate with a 0.038 zip wire and a used  a 5 [place] open-ended catheter up to the level of the renal pelvis.  I then placed a 6 [place] by 26 cm right ureteral stent  with a curl noted within the renal pelvis and bladder respectively.  Urine was seen emanating from the sidehole at the end the case.  I then placed a Foley catheter.  The bladder was then emptied and the procedure ended.  The patient appeared to tolerate the procedure well. The patient was able to be awakened and transferred to the recovery unit in satisfactory condition.   Plan: Leave the Foley to gravity.  We will leave her right ureteral stent in place.  We will ensure that her bladder remains reduced and no longer prolapse.  Given her tortuous left ureter with inability to place a retrograde stent, I consulted IR who will plan on left nephroureteral stent with PCN tomorrow. I discussed with her friend over the phone.  Matt R. [facility]  Pager: [phone]

## 2021-05-18 NOTE — Progress Notes (Signed)
1 Day Post-Op Subjective: Pain controlled.  No nausea or emesis.  Afebrile.  Objective: Vital signs in last 24 hours: Temp:  [97.7 F (36.5 C)-99.5 F (37.5 C)] 97.7 F (36.5 C) (08/05 0454) Pulse Rate:  [79-96] 89 (08/05 0454) Resp:  [14-18] 16 (08/05 0454) BP: (117-136)/(46-65) 121/61 (08/05 0454) SpO2:  [91 %-98 %] 93 % (08/05 0723) Weight:  [56.9 kg] 56.9 kg (08/04 1544)  Intake/Output from previous day: 08/04 0701 - 08/05 0700 In: 1993 [P.O.:236; I.V.:1600; IV Piggyback:157] Out: 2650 [Urine:2650] Intake/Output this shift: No intake/output data recorded.  UOP: 2.6L clear  Physical Exam:  General: Alert and oriented CV: RRR Lungs: Clear Abdomen: Soft, ND, NT Ext: NT, No erythema GU: Cystocele present and bladder prolapsed. With a chaperone present, I was able to reduce her bladder back into her vaginal vault. She tolerated well.  Lab Results: Recent Labs    05/17/21 0456 05/18/21 0525 05/19/21 0509  HGB 8.6* 8.4* 10.1*  HCT 27.0* 26.1* 31.4*   BMET Recent Labs    05/18/21 0525 05/19/21 0509  NA 131* 138  K 3.5 3.9  CL 106 110  CO2 18* 16*  GLUCOSE 109* 134*  BUN 17 19  CREATININE 2.11* 1.88*  CALCIUM 7.5* 8.5*     Studies/Results: CT ABDOMEN PELVIS WO CONTRAST  Result Date: 05/17/2021 CLINICAL DATA:  Abdominal pain, gram-negative bacteremia. EXAM: CT ABDOMEN AND PELVIS WITHOUT CONTRAST TECHNIQUE: Multidetector CT imaging of the abdomen and pelvis was performed following the standard protocol without IV contrast. COMPARISON:  09/09/2012 CT abdomen FINDINGS: Lower chest: Descending thoracic aortic atherosclerotic calcification. Low-density blood pool suggesting anemia. Small bilateral pleural effusions. Peripheral atelectasis in the right lower lobe. Airway thickening is present, suggesting bronchitis or reactive airways disease. Calcified granuloma in the lingula (benign). Hepatobiliary: Cholecystectomy.  Otherwise unremarkable. Pancreas: Unremarkable  Spleen: Unremarkable Adrenals/Urinary Tract: 1.0 cm nodule of the lateral limb left adrenal gland is unchanged from 09/09/2012 hence likely benign, but with indeterminate density. Substantial bilateral perirenal stranding along with moderate to prominent bilateral hydronephrosis and hydroureter. There is pelvic floor laxity with a cystocele and with the distal ureters extending down well below the pubococcygeal line in presumed herniated bladder. This extends down beyond the inferior margin of today's examination. Both kidneys appear expanded and with multiple cystic lesions. Some of the lesions in the kidneys are hyperdense likely reflecting complex cysts although technically nonspecific. Stable findings of partial nephrectomy along the right kidney lower pole. Stomach/Bowel: Low position of the anorectal junction due to pelvic floor laxity. Vascular/Lymphatic: Aortoiliac atherosclerotic vascular disease. Reproductive: Hysterectomy. Nonvisualization of the vagina probably attributable to the severe pelvic floor laxity. Other: Small amount of free pelvic fluid in the paracolic gutters. Presacral edema. Subcutaneous edema. Low-grade central mesenteric edema. Musculoskeletal: Posterolateral rod and pedicle screw fixation bilaterally at L3-4. Solid interbody fusion at L4-5. Posterior decompression from L3 through L5. Small supraumbilical hernia containing adipose tissue. IMPRESSION: 1. Prominent bilateral hydronephrosis and hydroureter with prominent pelvic floor laxity allowing the urinary bladder and UVJ region to herniate well below the level of the ischial tuberosities. I suspect the prominent cystocele may be causing some traction on the ureterovesical junctions leading to the bilateral obstruction. 2. There is substantial bilateral perirenal stranding along with scattered renal lesions of varying density which are probably cysts but technically nonspecific. 3. Third spacing of fluid with a small amount of  ascites, mesenteric edema, small bilateral pleural effusions, as well as subcutaneous edema and presacral edema. 4. Low position of the anorectal  junction. Nonvisualization of the vagina related to pelvic floor laxity. 5. Low-density blood pool suggests anemia. 6. Airway thickening is present, suggesting bronchitis or reactive airways disease. 7.  Aortic Atherosclerosis (ICD10-I70.0). 8. Small supraumbilical hernia contains adipose tissue. 9. Chronically stable small left adrenal mass, benign. Electronically Signed   By: Van Clines M.D.   On: 05/17/2021 19:33   DG CHEST PORT 1 VIEW  Result Date: 05/17/2021 CLINICAL DATA:  History of pneumonia. EXAM: PORTABLE CHEST 1 VIEW COMPARISON:  Chest x-ray 10/2020. FINDINGS: Mediastinum and hilar structures normal. Heart size normal. Mild right infrahilar/right base subsegmental atelectasis. No pleural effusion or pneumothorax. No acute bony abnormality. IMPRESSION: Mild right infrahilar/right base subsegmental atelectasis. Electronically Signed   By: Marcello Moores  Register   On: 05/17/2021 11:45   DG C-Arm 1-60 Min-No Report  Result Date: 05/18/2021 Fluoroscopy was utilized by the requesting physician.  No radiographic interpretation.    Assessment/Plan: #1.  Cystocele: She has a planned robotic assisted laparoscopic sacrocolpopexy with Dr. Louis Meckel on 05/24/2021.  Given her UTI with bacteremia, this case may be delayed.  I sent Dr. Louis Meckel a message.   2.  Bilateral hydroureteronephrosis: Seen on CT A/P 05/17/2021 to the bilateral UVJ likely caused by her cystocele.  S/p cystoscopy, right ureteral stent placement, attempted left ureteral stent placement.  I was able to place a left stent due to her torturous ureter proximally.   3.  Acute kidney injury: Her creatinine on 05/18/2021 is 2.1 elevated from baseline of 1.     4. UTI: Ucx 05/15/2021 with ESBL E. coli in the office and Klebsiella in the hospital.  She was on ancef at time of consultation however, I note  that her Ucx 8/1 with ESBL E coli was resistence to ancef.  I switched her to gentamicin.  -Her bladder was prolapsed again on rounds. With a chaperone present, I was able to reduce her bladder into her vaginal vault. She tolerated well. -S/p cystoscopy, right ureteral stent placement on 05/18/2021.  I was unable to place a left ureteral stent due to her torturous proximal left ureter.  I consulted interventional radiology who are planning for left nephroureteral stent, left nephrostomy tube on 05/19/2021.  She is n.p.o. for this procedure.  INR is within normal limits. -Foley catheter to gravity. -Continue gentamicin.  Urine culture 8/1 in the office was resistant to Ancef growing ESBL E. coli.  I pasted the results of this urine culture in my initial consult note on 8/3.  She will need at least 1 week of culture specific antibiotics upon discharge. -Following   LOS: 3 days   Matt R. Rufus Beske MD 05/19/2021, 8:06 AM [facility]  Pager: [phone]

## 2021-05-18 NOTE — Anesthesia Procedure Notes (Signed)
Procedure Name: LMA Insertion Date/Time: 05/18/2021 6:10 PM Performed by: Lissa Morales, CRNA Pre-anesthesia Checklist: Patient identified, Emergency Drugs available, Suction available and Patient being monitored Patient Re-evaluated:Patient Re-evaluated prior to induction Oxygen Delivery Method: Circle system utilized Preoxygenation: Pre-oxygenation with 100% oxygen Induction Type: IV induction LMA: LMA with gastric port inserted LMA Size: 4.0 Tube type: Oral Number of attempts: 1 Placement Confirmation: ETT inserted through vocal cords under direct vision, positive ETCO2 and breath sounds checked- equal and bilateral Tube secured with: Tape Dental Injury: Teeth and Oropharynx as per pre-operative assessment

## 2021-05-18 NOTE — Progress Notes (Signed)
PROGRESS NOTE    Theresa HUSCHER  [id number] DOB: 1951-07-29 DOA: 05/15/2021 PCP: Sharilyn Sites, MD    Brief Narrative:  70 year old female with history of COPD, asthma, anxiety, history of partial nephrectomy for left-sided renal cancer presented to the emergency room with fever, dysuria and hematuria.  Patient has been suffering from bladder prolapse and followed up with urology with planned sling surgery on 8/10.  Patient had frequency and dysuria and was given Foley catheter on urology visit on 8/1.  Urine culture taken on 7/28 for preop eval was positive for Klebsiella.  Blood cultures on 8/1 positive for Klebsiella.   Assessment & Plan:   Principal Problem:   Sepsis secondary to UTI [facility]) Active Problems:   Sepsis ([place])   Hypokalemia   AKI (acute kidney injury) ([place])   Bladder prolapse, female, acquired  Sepsis secondary to Klebsiella bacteremia/Klebsiella UTI:  Patient currently remains on Ancef.  Continues to have suprapubic pain and right flank pain.  Repeat blood cultures 8/3 negative so far.  WBC count trending down.   CT scan abdomen pelvis without contrast shows bilateral hydronephrosis.  Urine output is adequate. Deformed ureters and hydronephrosis.  Prolapsed vaginal wall and bladder. Will discuss with her urologist if any inpatient procedure needs to be done.  Acute kidney injury: Due to #1.  Continue IV fluid resuscitation.  Renal functions not improving despite having Foley catheter in place.  Likely related to bilateral obstruction from hydronephrosis and ureteric stretching/blockage.  Will discuss with urology.  Hypokalemia/hypomagnesemia: Replaced with improvement.  Hypothyroidism: Euthyroid on replacement.  Continue.  Case discussed with urology.  Keep NPO.  For bilateral stent placement due to inadequate drainage.   DVT prophylaxis: Place and maintain sequential compression device Start: 05/18/21 1146 SCD   Code Status: Full code Family Communication:  none today .  Disposition Plan: Status is: Inpatient  Remains inpatient appropriate because:Inpatient level of care appropriate due to severity of illness  Dispo: The patient is from: Home              Anticipated d/c is to: Home              Patient currently is not medically stable to d/c.   Difficult to place patient No         Consultants:  Urology  Procedures:  None  Antimicrobials:  Ancef 8/1---   Subjective: Patient seen and examined.  Afebrile.  Continues to have abdominal discomfort and some distention.  Feels anxious at times. Has some dry cough but no sputum production. She has prolapsed vaginal wall with urinary bladder and has some bleeding from the mucosal surface. Creatinine worsening despite decompression of urinary bladder.  Objective: Vitals:   05/18/21 0451 05/18/21 0600 05/18/21 0805 05/18/21 0835  BP:  (!) 123/52 (!) 113/59   Pulse:  82 87   Resp:  16 18   Temp: 98.2 F (36.8 C) 98.2 F (36.8 C) 97.7 F (36.5 C)   TempSrc: Oral Oral Oral   SpO2:  92% 97% 93%  Weight:      Height:        Intake/Output Summary (Last 24 hours) at 05/18/2021 1151 Last data filed at 05/18/2021 0813 Gross per 24 hour  Intake 1460 ml  Output 2075 ml  Net -615 ml   Filed Weights   05/15/21 1617  Weight: 56.9 kg    Examination:  General: Mildly anxious.  Mostly comfortable at rest. Cardiovascular: S1-S2 normal.  No added sounds. Respiratory: Bilateral clear.  No added sounds. Gastrointestinal: Soft.  Mildly distended.  She has some point tenderness along the right costophrenic angle.  Foley catheter with clear urine. Ext: No swelling or edema.  No cyanosis. Neuro: Grossly normal. Musculoskeletal: No deformities.      Data Reviewed: I have personally reviewed following labs and imaging studies  CBC: Recent Labs  Lab 05/15/21 1709 05/16/21 0814 05/17/21 0456 05/18/21 0525  WBC 25.5* 23.8* 21.7* 18.3*  NEUTROABS 22.3*  --   --  15.9*  HGB 10.0*  9.6* 8.6* 8.4*  HCT 31.0* 30.5* 27.0* 26.1*  MCV 87.8 89.2 89.4 89.1  PLT 615* 557* 520* [id number]*   Basic Metabolic Panel: Recent Labs  Lab 05/15/21 1709 05/16/21 0451 05/17/21 0456 05/18/21 0525  NA 134* 131* 132* 131*  K 2.8* 3.0* 3.5 3.5  CL 101 104 103 106  CO2 22 18* 19* 18*  GLUCOSE 100* 89 115* 109*  BUN '14 17 17 17  '$ CREATININE 1.02* 1.46* 1.60* 2.11*  CALCIUM 8.9 7.2* 7.7* 7.5*  MG  --   --  1.5* 1.9  PHOS  --   --   --  2.9   GFR: Estimated Creatinine Clearance: 22.9 mL/min (A) (by C-G formula based on SCr of 2.11 mg/dL (H)). Liver Function Tests: Recent Labs  Lab 05/15/21 1709  AST 16  ALT 17  ALKPHOS 106  BILITOT 0.6  PROT 7.1  ALBUMIN 2.9*   No results for input(s): LIPASE, AMYLASE in the last 168 hours. No results for input(s): AMMONIA in the last 168 hours. Coagulation Profile: Recent Labs  Lab 05/15/21 1709  INR 1.0   Cardiac Enzymes: No results for input(s): CKTOTAL, CKMB, CKMBINDEX, TROPONINI in the last 168 hours. BNP (last 3 results) No results for input(s): PROBNP in the last 8760 hours. HbA1C: No results for input(s): HGBA1C in the last 72 hours. CBG: No results for input(s): GLUCAP in the last 168 hours. Lipid Profile: No results for input(s): CHOL, HDL, LDLCALC, TRIG, CHOLHDL, LDLDIRECT in the last 72 hours. Thyroid Function Tests: No results for input(s): TSH, T4TOTAL, FREET4, T3FREE, THYROIDAB in the last 72 hours. Anemia Panel: No results for input(s): VITAMINB12, FOLATE, FERRITIN, TIBC, IRON, RETICCTPCT in the last 72 hours. Sepsis Labs: Recent Labs  Lab 05/15/21 1709 05/16/21 0814  LATICACIDVEN 1.1 1.2    Recent Results (from the past 240 hour(s))  Urine culture     Status: Abnormal   Collection Time: 05/11/21 10:10 AM   Specimen: Urine, Clean Catch  Result Value Ref Range Status   Specimen Description   Final    URINE, CLEAN CATCH Performed at [facility], [place] [address], [place], [place] [address]     Special Requests   Final    NONE Performed at [facility], [place] [address], [place], [place] [address]    Culture 80,000 COLONIES/mL KLEBSIELLA PNEUMONIAE (A)  Final   Report Status 05/14/2021 FINAL  Final   Organism ID, Bacteria KLEBSIELLA PNEUMONIAE (A)  Final      Susceptibility   Klebsiella pneumoniae - MIC*    AMPICILLIN >=32 RESISTANT Resistant     CEFAZOLIN <=4 SENSITIVE Sensitive     CEFEPIME <=0.12 SENSITIVE Sensitive     CEFTRIAXONE <=0.25 SENSITIVE Sensitive     CIPROFLOXACIN <=0.25 SENSITIVE Sensitive     GENTAMICIN <=1 SENSITIVE Sensitive     IMIPENEM <=0.25 SENSITIVE Sensitive     NITROFURANTOIN 64 INTERMEDIATE Intermediate     TRIMETH/SULFA <=20 SENSITIVE Sensitive  AMPICILLIN/SULBACTAM 8 SENSITIVE Sensitive     PIP/TAZO <=4 SENSITIVE Sensitive     * 80,000 COLONIES/mL KLEBSIELLA PNEUMONIAE  Blood culture (routine single)     Status: Abnormal   Collection Time: 05/15/21  5:10 PM   Specimen: BLOOD  Result Value Ref Range Status   Specimen Description   Final    BLOOD RIGHT ANTECUBITAL Performed at [facility] [address], [place], [place] [address]    Special Requests   Final    BOTTLES DRAWN AEROBIC AND ANAEROBIC Blood Culture results may not be optimal due to an excessive volume of blood received in culture bottles Performed at [place] [address], [place], [place] [address]    Culture  Setup Time   Final    GRAM NEGATIVE RODS ANAEROBIC BOTTLE ONLY CRITICAL RESULT CALLED TO, READ BACK BY AND VERIFIED WITH: PHARMD J.GADHIA AT [id number] ON 05/16/2021 BY T.SAAD. Performed at [facility], [place] [address], [place], [place] [address]    Culture KLEBSIELLA PNEUMONIAE (A)  Final   Report Status 05/18/2021 FINAL  Final   Organism ID, Bacteria KLEBSIELLA PNEUMONIAE  Final      Susceptibility   Klebsiella pneumoniae - MIC*    AMPICILLIN >=32 RESISTANT Resistant     CEFAZOLIN <=4 SENSITIVE  Sensitive     CEFEPIME <=0.12 SENSITIVE Sensitive     CEFTAZIDIME <=1 SENSITIVE Sensitive     CEFTRIAXONE <=0.25 SENSITIVE Sensitive     CIPROFLOXACIN <=0.25 SENSITIVE Sensitive     GENTAMICIN <=1 SENSITIVE Sensitive     IMIPENEM <=0.25 SENSITIVE Sensitive     TRIMETH/SULFA <=20 SENSITIVE Sensitive     AMPICILLIN/SULBACTAM 4 SENSITIVE Sensitive     PIP/TAZO <=4 SENSITIVE Sensitive     * KLEBSIELLA PNEUMONIAE  Urine Culture     Status: Abnormal   Collection Time: 05/15/21  5:10 PM   Specimen: In/Out Cath Urine  Result Value Ref Range Status   Specimen Description   Final    IN/OUT CATH URINE Performed at [facility] [address] [place], [place] [address]    Special Requests   Final    NONE Performed at [facility], [place] [address], [place], [place] [address]    Culture >=100,000 COLONIES/mL KLEBSIELLA PNEUMONIAE (A)  Final   Report Status 05/18/2021 FINAL  Final   Organism ID, Bacteria KLEBSIELLA PNEUMONIAE (A)  Final      Susceptibility   Klebsiella pneumoniae - MIC*    AMPICILLIN >=32 RESISTANT Resistant     CEFAZOLIN <=4 SENSITIVE Sensitive     CEFEPIME <=0.12 SENSITIVE Sensitive     CEFTRIAXONE <=0.25 SENSITIVE Sensitive     CIPROFLOXACIN <=0.25 SENSITIVE Sensitive     GENTAMICIN <=1 SENSITIVE Sensitive     IMIPENEM <=0.25 SENSITIVE Sensitive     NITROFURANTOIN 64 INTERMEDIATE Intermediate     TRIMETH/SULFA <=20 SENSITIVE Sensitive     AMPICILLIN/SULBACTAM 8 SENSITIVE Sensitive     PIP/TAZO <=4 SENSITIVE Sensitive     * >=100,000 COLONIES/mL KLEBSIELLA PNEUMONIAE  Resp Panel by RT-PCR (Flu A&B, Covid)     Status: None   Collection Time: 05/15/21  5:10 PM   Specimen: Nasopharyngeal(NP) swabs in vial transport medium  Result Value Ref Range Status   SARS Coronavirus 2 by RT PCR NEGATIVE NEGATIVE Final    Comment: (NOTE) SARS-CoV-2 target nucleic acids are NOT DETECTED.  The SARS-CoV-2 RNA is generally detectable in upper  respiratory specimens during the acute phase of  infection. The lowest concentration of SARS-CoV-2 viral copies this assay can detect is 138 copies/mL. A negative result does not preclude SARS-Cov-2 infection and should not be used as the sole basis for treatment or other patient management decisions. A negative result may occur with  improper specimen collection/handling, submission of specimen other than nasopharyngeal swab, presence of viral mutation(s) within the areas targeted by this assay, and inadequate number of viral copies(<138 copies/mL). A negative result must be combined with clinical observations, patient history, and epidemiological information. The expected result is Negative.  Fact Sheet for Patients:  EntrepreneurPulse.com.au  Fact Sheet for Healthcare Providers:  IncredibleEmployment.be  This test is no t yet approved or cleared by the [place] FDA and  has been authorized for detection and/or diagnosis of SARS-CoV-2 by FDA under an Emergency Use Authorization (EUA). This EUA will remain  in effect (meaning this test can be used) for the duration of the COVID-19 declaration under Section 564(b)(1) of the Act, 21 U.S.C.section 360bbb-3(b)(1), unless the authorization is terminated  or revoked sooner.       Influenza A by PCR NEGATIVE NEGATIVE Final   Influenza B by PCR NEGATIVE NEGATIVE Final    Comment: (NOTE) The Xpert Xpress SARS-CoV-2/FLU/RSV plus assay is intended as an aid in the diagnosis of influenza from Nasopharyngeal swab specimens and should not be used as a sole basis for treatment. Nasal washings and aspirates are unacceptable for Xpert Xpress SARS-CoV-2/FLU/RSV testing.  Fact Sheet for Patients: EntrepreneurPulse.com.au  Fact Sheet for Healthcare Providers: IncredibleEmployment.be  This test is not yet approved or cleared by the [place] FDA and has been  authorized for detection and/or diagnosis of SARS-CoV-2 by FDA under an Emergency Use Authorization (EUA). This EUA will remain in effect (meaning this test can be used) for the duration of the COVID-19 declaration under Section 564(b)(1) of the Act, 21 U.S.C. section 360bbb-3(b)(1), unless the authorization is terminated or revoked.  Performed at [facility], [place] [address], [place], [place] [address]   Blood Culture ID Panel (Reflexed)     Status: Abnormal   Collection Time: 05/15/21  5:10 PM  Result Value Ref Range Status   Enterococcus faecalis NOT DETECTED NOT DETECTED Final   Enterococcus Faecium NOT DETECTED NOT DETECTED Final   Listeria monocytogenes NOT DETECTED NOT DETECTED Final   Staphylococcus species NOT DETECTED NOT DETECTED Final   Staphylococcus aureus (BCID) NOT DETECTED NOT DETECTED Final   Staphylococcus epidermidis NOT DETECTED NOT DETECTED Final   Staphylococcus lugdunensis NOT DETECTED NOT DETECTED Final   Streptococcus species NOT DETECTED NOT DETECTED Final   Streptococcus agalactiae NOT DETECTED NOT DETECTED Final   Streptococcus pneumoniae NOT DETECTED NOT DETECTED Final   Streptococcus pyogenes NOT DETECTED NOT DETECTED Final   A.calcoaceticus-baumannii NOT DETECTED NOT DETECTED Final   Bacteroides fragilis NOT DETECTED NOT DETECTED Final   Enterobacterales DETECTED (A) NOT DETECTED Final    Comment: Enterobacterales represent a large order of gram negative bacteria, not a single organism. CRITICAL RESULT CALLED TO, READ BACK BY AND VERIFIED WITH: PHARMD J.GADHIA AT [id number] ON 05/16/2021 BY T.SAAD.    Enterobacter cloacae complex NOT DETECTED NOT DETECTED Final   Escherichia coli NOT DETECTED NOT DETECTED Final   Klebsiella aerogenes NOT DETECTED NOT DETECTED Final   Klebsiella oxytoca NOT DETECTED NOT DETECTED Final   Klebsiella pneumoniae DETECTED (A) NOT DETECTED Final    Comment: CRITICAL RESULT CALLED TO, READ BACK BY AND VERIFIED  WITH: PHARMD J.GADHIA AT 1259 ON 05/16/2021  BY T.SAAD.    Proteus species NOT DETECTED NOT DETECTED Final   Salmonella species NOT DETECTED NOT DETECTED Final   Serratia marcescens NOT DETECTED NOT DETECTED Final   Haemophilus influenzae NOT DETECTED NOT DETECTED Final   Neisseria meningitidis NOT DETECTED NOT DETECTED Final   Pseudomonas aeruginosa NOT DETECTED NOT DETECTED Final   Stenotrophomonas maltophilia NOT DETECTED NOT DETECTED Final   Candida albicans NOT DETECTED NOT DETECTED Final   Candida auris NOT DETECTED NOT DETECTED Final   Candida glabrata NOT DETECTED NOT DETECTED Final   Candida krusei NOT DETECTED NOT DETECTED Final   Candida parapsilosis NOT DETECTED NOT DETECTED Final   Candida tropicalis NOT DETECTED NOT DETECTED Final   Cryptococcus neoformans/gattii NOT DETECTED NOT DETECTED Final   CTX-M ESBL NOT DETECTED NOT DETECTED Final   Carbapenem resistance IMP NOT DETECTED NOT DETECTED Final   Carbapenem resistance KPC NOT DETECTED NOT DETECTED Final   Carbapenem resistance NDM NOT DETECTED NOT DETECTED Final   Carbapenem resist OXA 48 LIKE NOT DETECTED NOT DETECTED Final   Carbapenem resistance VIM NOT DETECTED NOT DETECTED Final    Comment: Performed at [facility], [address], [place], [place] [address]  Culture, blood (routine x 2)     Status: None (Preliminary result)   Collection Time: 05/17/21 11:50 AM   Specimen: BLOOD  Result Value Ref Range Status   Specimen Description   Final    BLOOD RIGHT ANTECUBITAL Performed at [facility] [address] [place], [place] [address]    Special Requests   Final    BOTTLES DRAWN AEROBIC AND ANAEROBIC BCLV Performed at [place] [place] [address] [place], [place] [address]    Culture   Final    NO GROWTH < 24 HOURS Performed at [place] [address] [place], [place] [address]    Report Status PENDING  Incomplete  Culture, blood (routine x 2)      Status: None (Preliminary result)   Collection Time: 05/17/21 11:50 AM   Specimen: BLOOD  Result Value Ref Range Status   Specimen Description   Final    BLOOD RT WRIST Performed at [place] [address] [place], [place] [address]    Special Requests   Final    BOTTLES DRAWN AEROBIC AND ANAEROBIC Blood Culture results may not be optimal due to an inadequate volume of blood received in culture bottles Performed at [place] [address] [place], [place] [address]    Culture   Final    NO GROWTH < 24 HOURS Performed at [facility] [address], [place],  [address]    Report Status PENDING  Incomplete         Radiology Studies: CT ABDOMEN PELVIS WO CONTRAST  Result Date: 05/17/2021 CLINICAL DATA:  Abdominal pain, gram-negative bacteremia. EXAM: CT ABDOMEN AND PELVIS WITHOUT CONTRAST TECHNIQUE: Multidetector CT imaging of the abdomen and pelvis was performed following the standard protocol without IV contrast. COMPARISON:  09/09/2012 CT abdomen FINDINGS: Lower chest: Descending thoracic aortic atherosclerotic calcification. Low-density blood pool suggesting anemia. Small bilateral pleural effusions. Peripheral atelectasis in the right lower lobe. Airway thickening is present, suggesting bronchitis or reactive airways disease. Calcified granuloma in the lingula (benign). Hepatobiliary: Cholecystectomy.  Otherwise unremarkable. Pancreas: Unremarkable Spleen: Unremarkable Adrenals/Urinary Tract: 1.0 cm nodule of the lateral limb left adrenal gland is unchanged from 09/09/2012 hence likely benign, but with indeterminate density. Substantial  bilateral perirenal stranding along with moderate to prominent bilateral hydronephrosis and hydroureter. There is pelvic floor laxity with a cystocele and with the distal ureters extending down well below the pubococcygeal line in presumed herniated bladder. This extends down beyond the  inferior margin of today's examination. Both kidneys appear expanded and with multiple cystic lesions. Some of the lesions in the kidneys are hyperdense likely reflecting complex cysts although technically nonspecific. Stable findings of partial nephrectomy along the right kidney lower pole. Stomach/Bowel: Low position of the anorectal junction due to pelvic floor laxity. Vascular/Lymphatic: Aortoiliac atherosclerotic vascular disease. Reproductive: Hysterectomy. Nonvisualization of the vagina probably attributable to the severe pelvic floor laxity. Other: Small amount of free pelvic fluid in the paracolic gutters. Presacral edema. Subcutaneous edema. Low-grade central mesenteric edema. Musculoskeletal: Posterolateral rod and pedicle screw fixation bilaterally at L3-4. Solid interbody fusion at L4-5. Posterior decompression from L3 through L5. Small supraumbilical hernia containing adipose tissue. IMPRESSION: 1. Prominent bilateral hydronephrosis and hydroureter with prominent pelvic floor laxity allowing the urinary bladder and UVJ region to herniate well below the level of the ischial tuberosities. I suspect the prominent cystocele may be causing some traction on the ureterovesical junctions leading to the bilateral obstruction. 2. There is substantial bilateral perirenal stranding along with scattered renal lesions of varying density which are probably cysts but technically nonspecific. 3. Third spacing of fluid with a small amount of ascites, mesenteric edema, small bilateral pleural effusions, as well as subcutaneous edema and presacral edema. 4. Low position of the anorectal junction. Nonvisualization of the vagina related to pelvic floor laxity. 5. Low-density blood pool suggests anemia. 6. Airway thickening is present, suggesting bronchitis or reactive airways disease. 7.  Aortic Atherosclerosis (ICD10-I70.0). 8. Small supraumbilical hernia contains adipose tissue. 9. Chronically stable small left adrenal  mass, benign. Electronically Signed   By: Van Clines M.D.   On: 05/17/2021 19:33   US RENAL  Result Date: 05/16/2021 CLINICAL DATA:  Acute renal injury. History of partial right nephrectomy. EXAM: RENAL / URINARY TRACT ULTRASOUND COMPLETE COMPARISON:  CT chest 09/10/2018.  MRI abdomen 10/12/2013. FINDINGS: Right Kidney: Renal measurements: 12.3 x 5.5 x 5.9 cm . Mild increased echogenicity cannot be excluded. Mild hydronephrosis cannot be excluded. 2.3 cm complex cyst upper portion right kidney. 4.2 cm simple cyst midportion right kidney. 0.8 cm punctate hyperechoic focus mid right kidney, most likely an angiomyolipoma. Left Kidney: Renal measurements: 14.5 x 6.8 x 6.0 cm. Mild increased echogenicity cannot be excluded. Mild hydronephrosis cannot be excluded. 1.6 cm hyperechoic focus superior pole left kidney, although this could represent an angiomyolipoma, renal malignancy cannot be excluded. 3.1 cm hypoechoic mass left upper renal pole, renal malignancy cannot be excluded. 3.6 cm complex cyst with thick septations left lower renal pole, renal malignancy cannot be excluded. Bladder: Bladder is nondistended.  Patient has a Foley catheter in. Other: Trace amount of free fluid noted adjacent to the right kidney. IMPRESSION: 1. Mild increased echogenicity both kidneys cannot be excluded. Chronic medical renal disease cannot be excluded. Trace amount of free fluid noted adjacent to the right kidney. 2. Mild bilateral hydronephrosis cannot be excluded. No bladder distention. Foley catheter in bladder. 3. Mass lesions within both kidneys as described above. Renal MRI is suggested as a renal malignancy cannot be excluded. Electronically Signed   By: Marcello Moores  Register   On: 05/16/2021 13:08   DG CHEST PORT 1 VIEW  Result Date: 05/17/2021 CLINICAL DATA:  History of pneumonia. EXAM: PORTABLE CHEST 1 VIEW COMPARISON:  Chest  x-ray 10/2020. FINDINGS: Mediastinum and hilar structures normal. Heart size normal. Mild  right infrahilar/right base subsegmental atelectasis. No pleural effusion or pneumothorax. No acute bony abnormality. IMPRESSION: Mild right infrahilar/right base subsegmental atelectasis. Electronically Signed   By: Marcello Moores  Register   On: 05/17/2021 11:45        Scheduled Meds:  ALPRAZolam  1 mg Oral QHS   Chlorhexidine Gluconate Cloth  6 each Topical Daily   estradiol  1 mg Oral Daily   ipratropium  1 spray Each Nare Daily   liothyronine  25 mcg Oral Daily   loratadine  10 mg Oral Daily   mometasone-formoterol  2 puff Inhalation BID   montelukast  10 mg Oral QHS   pantoprazole  40 mg Oral Daily   Continuous Infusions:   ceFAZolin (ANCEF) IV       LOS: 2 days    Time spent: 34 minutes    Barb Merino, MD [facility] Pager [phone]

## 2021-05-18 NOTE — Progress Notes (Signed)
Patient off the floor and down to surgery.

## 2021-05-18 NOTE — Consult Note (Signed)
Urology Consult   Physician requesting consult: Barb Merino, MD  Reason for consult: Barb Merino, MD  History of Present Illness: Theresa Holloway is a 70 y.o. female with a history of  Theresa Holloway is a 70 year old female with a history of pelvic organ prolapse for about 1 year with associated urinary urgency and urinary urge incontinence who is scheduled to undergo robotic assisted laparoscopic sacrocolpopexy with Dr. Louis Meckel on 05/24/2021.  She was seen in the office by Nonie Hoyer, NP on 05/15/2021 for a work in with concerns for a urinary tract infection.  Urine culture 05/15/2021 resulted greater than 100,000 ESBL E. coli.  She also had a fever of 101.4 after Tylenol and was reportedly toxic appearing and was advised to present to the emergency department with concerns for sepsis.  Prior to leaving the office, a Foley catheter was placed on 05/15/2021.  Repeat urine culture 05/15/2021 resulted with greater than 100,000 Klebsiella.  She was also found to be bacteremic with Klebsiella noted on blood culture.  She continues to complain of bilateral flank pain.  This prompted CT A/P 05/17/2021 which identified bilateral hydroureteronephrosis to the level of the bladder.  The bladder was seen herniating below the level of the ischial tuberosities.  It is likely that this prominent cystocele is causing traction on the ureterovesical junction leading to bilateral obstruction of her kidneys.  Her creatinine on 05/18/2021 was 2.1 from a baseline of less than 1.  Past Medical History:  Diagnosis Date   Anginal pain ([place]) 03/22/2017   due to stress   Asthma    Chronic kidney disease [id number]   cancer   Complication of anesthesia    COPD (chronic obstructive pulmonary disease) (HCC)    mild   FHx: gallbladder disease    GERD (gastroesophageal reflux disease)    H/O: hysterectomy    Headache(784.0) 01/29/2013   History of back surgery    History of kidney cancer    Hypothyroidism    Insomnia 01/29/2013    Osteoporosis    Pneumonia 2019   PONV (postoperative nausea and vomiting)    Postconcussion syndrome 01/29/2013   Right ankle injury    Sinus tarsi syndrome    Squamous cell carcinoma of skin 02/23/2019   in situ on left forearm, lower - tx p bx   Squamous cell carcinoma of skin 02/23/2019   in situ on left thigh, mid - tx p bx   Squamous cell carcinoma of skin 02/23/2019   in situ on left lower, inner shin - tx p bx    Past Surgical History:  Procedure Laterality Date   ABDOMINAL HYSTERECTOMY  1995   BACK SURGERY  2010   BREAST BIOPSY Left 2004   negative   CHOLECYSTECTOMY  1996   ELBOW SURGERY Right 1997   left foot surgery  2012   NEPHRECTOMY  2009   right foot surgery  2015   right knee surgery  1979   right ring finger surgery  2002   STOMACH SURGERY  2003     Current Hospital Medications:  Home meds:  No current facility-administered medications on file prior to encounter.   Current Outpatient Medications on File Prior to Encounter  Medication Sig Dispense Refill   acetaminophen (TYLENOL) 500 MG tablet Take 1,000 mg by mouth every 6 (six) hours as needed for moderate pain.     acyclovir (ZOVIRAX) 800 MG tablet Take 800 mg by mouth 5 (five) times daily as needed (shingles).     ALPRAZolam (  XANAX) 1 MG tablet Take 1 mg by mouth at bedtime.      azithromycin (ZITHROMAX) 250 MG tablet Take 250 mg by mouth See admin instructions. Zpack     cetirizine (ZYRTEC) 10 MG tablet Take 10 mg by mouth daily.     estradiol (ESTRACE) 1 MG tablet Take 1 tablet (1 mg total) by mouth daily. 90 tablet 4   fluticasone (FLONASE) 50 MCG/ACT nasal spray Place 1 spray into both nostrils in the morning and at bedtime.     ipratropium (ATROVENT) 0.03 % nasal spray Place 1 spray into both nostrils daily.     liothyronine (CYTOMEL) 25 MCG tablet Take 25 mcg by mouth daily.     meclizine (ANTIVERT) 25 MG tablet Take 25 mg by mouth 3 (three) times daily as needed for dizziness.     montelukast  (SINGULAIR) 10 MG tablet Take 10 mg by mouth at bedtime.     OTEZLA 30 MG TABS TAKE 1 TABLET BY MOUTH 2 TIMES A DAY. (Patient taking differently: Take 30 mg by mouth in the morning and at bedtime.) 60 tablet 3   pantoprazole (PROTONIX) 40 MG tablet Take 40 mg by mouth daily.     Probiotic Product (TRUBIOTICS) CAPS Take 1 capsule by mouth daily.     RESTASIS 0.05 % ophthalmic emulsion Place 1 drop into both eyes 2 (two) times daily.     SYMBICORT 160-4.5 MCG/ACT inhaler Inhale 2 puffs into the lungs 2 (two) times daily as needed (wheezing).     Apremilast (OTEZLA) 10 & 20 & 30 MG TBPK Take as directed per package instructions. (Patient not taking: No sig reported) 1 each 0     Scheduled Meds:  [MAR Hold] ALPRAZolam  1 mg Oral QHS   [MAR Hold] Chlorhexidine Gluconate Cloth  6 each Topical Daily   [MAR Hold] estradiol  1 mg Oral Daily   [MAR Hold] ipratropium  1 spray Each Nare Daily   [MAR Hold] liothyronine  25 mcg Oral Daily   [MAR Hold] loratadine  10 mg Oral Daily   [MAR Hold] mometasone-formoterol  2 puff Inhalation BID   [MAR Hold] montelukast  10 mg Oral QHS   [MAR Hold] pantoprazole  40 mg Oral Daily   Continuous Infusions:  [MAR Hold]  ceFAZolin (ANCEF) IV     lactated ringers 10 mL/hr at 05/18/21 1552   PRN Meds:.[MAR Hold] acetaminophen, [MAR Hold] docusate sodium, [MAR Hold] fentaNYL (SUBLIMAZE) injection, [MAR Hold] guaiFENesin-dextromethorphan, [MAR Hold] ondansetron (ZOFRAN) IV, [MAR Hold] traMADol  Allergies:  Allergies  Allergen Reactions   Codeine Anaphylaxis   Morphine And Related Anaphylaxis   Penicillins Anaphylaxis   Sulfa Antibiotics Anaphylaxis   Other     Cat gut suture - too long to heal    Prednisone     Excessive weight gain - 35lbs in 10 days   Skyrizi [Risankizumab] Nausea And Vomiting    Pt also got blisters when taking this med    Family History  Problem Relation Age of Onset   Heart failure Mother    COPD Mother    Heart attack Father     Breast cancer Neg Hx     Social History:  reports that she has never smoked. She has never used smokeless tobacco. She reports that she does not drink alcohol and does not use drugs.  ROS: A complete review of systems was performed.  All systems are negative except for pertinent findings as noted.  Physical Exam:  Vital signs  in last 24 hours: Temp:  [97.7 F (36.5 C)-101.8 F (38.8 C)] 98.3 F (36.8 C) (08/04 1543) Pulse Rate:  [79-100] 96 (08/04 1543) Resp:  [16-18] 18 (08/04 1543) BP: (113-129)/(49-59) 129/57 (08/04 1543) SpO2:  [91 %-98 %] 96 % (08/04 1543) Weight:  [56.9 kg] 56.9 kg (08/04 1544) Constitutional:  Alert and oriented, No acute distress Cardiovascular: Regular rate and rhythm Respiratory: Normal respiratory effort, Lungs clear bilaterally GI: Abdomen is soft, nontender, nondistended, no abdominal masses GU: Mild bilateral CVA tenderness Neurologic: Grossly intact, no focal deficits Psychiatric: Normal mood and affect  Laboratory Data:  Recent Labs    05/15/21 1709 05/16/21 0814 05/17/21 0456 05/18/21 0525  WBC 25.5* 23.8* 21.7* 18.3*  HGB 10.0* 9.6* 8.6* 8.4*  HCT 31.0* 30.5* 27.0* 26.1*  PLT 615* 557* 520* 512*    Recent Labs    05/15/21 1709 05/16/21 0451 05/17/21 0456 05/18/21 0525  NA 134* 131* 132* 131*  K 2.8* 3.0* 3.5 3.5  CL 101 104 103 106  GLUCOSE 100* 89 115* 109*  BUN '14 17 17 17  '$ CALCIUM 8.9 7.2* 7.7* 7.5*  CREATININE 1.02* 1.46* 1.60* 2.11*     Results for orders placed or performed during the hospital encounter of 05/15/21 (from the past 24 hour(s))  CBC with Differential/Platelet     Status: Abnormal   Collection Time: 05/18/21  5:25 AM  Result Value Ref Range   WBC 18.3 (H) 4.0 - 10.5 K/uL   RBC 2.93 (L) 3.87 - 5.11 MIL/uL   Hemoglobin 8.4 (L) 12.0 - 15.0 g/dL   HCT 26.1 (L) 36.0 - 46.0 %   MCV 89.1 80.0 - 100.0 fL   MCH 28.7 26.0 - 34.0 pg   MCHC 32.2 30.0 - 36.0 g/dL   RDW 14.8 11.5 - 15.5 %   Platelets 512 (H)  150 - 400 K/uL   nRBC 0.0 0.0 - 0.2 %   Neutrophils Relative % 87 %   Neutro Abs 15.9 (H) 1.7 - 7.7 K/uL   Lymphocytes Relative 6 %   Lymphs Abs 1.1 0.7 - 4.0 K/uL   Monocytes Relative 5 %   Monocytes Absolute 0.9 0.1 - 1.0 K/uL   Eosinophils Relative 0 %   Eosinophils Absolute 0.1 0.0 - 0.5 K/uL   Basophils Relative 0 %   Basophils Absolute 0.1 0.0 - 0.1 K/uL   Immature Granulocytes 2 %   Abs Immature Granulocytes 0.30 (H) 0.00 - 0.07 K/uL  Basic metabolic panel     Status: Abnormal   Collection Time: 05/18/21  5:25 AM  Result Value Ref Range   Sodium 131 (L) 135 - 145 mmol/L   Potassium 3.5 3.5 - 5.1 mmol/L   Chloride 106 98 - 111 mmol/L   CO2 18 (L) 22 - 32 mmol/L   Glucose, Bld 109 (H) 70 - 99 mg/dL   BUN 17 8 - 23 mg/dL   Creatinine, Ser 2.11 (H) 0.44 - 1.00 mg/dL   Calcium 7.5 (L) 8.9 - 10.3 mg/dL   GFR, Estimated 25 (L) >60 mL/min   Anion gap 7 5 - 15  Magnesium     Status: None   Collection Time: 05/18/21  5:25 AM  Result Value Ref Range   Magnesium 1.9 1.7 - 2.4 mg/dL  Phosphorus     Status: None   Collection Time: 05/18/21  5:25 AM  Result Value Ref Range   Phosphorus 2.9 2.5 - 4.6 mg/dL   Recent Results (from the past 240 hour(s))  Urine culture     Status: Abnormal   Collection Time: 05/11/21 10:10 AM   Specimen: Urine, Clean Catch  Result Value Ref Range Status   Specimen Description   Final    URINE, CLEAN CATCH Performed at [facility], [place] [address], [place], [place] [address]    Special Requests   Final    NONE Performed at [facility], [place] [address] [place], [place] [address]    Culture 80,000 COLONIES/mL KLEBSIELLA PNEUMONIAE (A)  Final   Report Status 05/14/2021 FINAL  Final   Organism ID, Bacteria KLEBSIELLA PNEUMONIAE (A)  Final      Susceptibility   Klebsiella pneumoniae - MIC*    AMPICILLIN >=32 RESISTANT Resistant     CEFAZOLIN <=4 SENSITIVE Sensitive     CEFEPIME <=0.12 SENSITIVE Sensitive      CEFTRIAXONE <=0.25 SENSITIVE Sensitive     CIPROFLOXACIN <=0.25 SENSITIVE Sensitive     GENTAMICIN <=1 SENSITIVE Sensitive     IMIPENEM <=0.25 SENSITIVE Sensitive     NITROFURANTOIN 64 INTERMEDIATE Intermediate     TRIMETH/SULFA <=20 SENSITIVE Sensitive     AMPICILLIN/SULBACTAM 8 SENSITIVE Sensitive     PIP/TAZO <=4 SENSITIVE Sensitive     * 80,000 COLONIES/mL KLEBSIELLA PNEUMONIAE  Blood culture (routine single)     Status: Abnormal   Collection Time: 05/15/21  5:10 PM   Specimen: BLOOD  Result Value Ref Range Status   Specimen Description   Final    BLOOD RIGHT ANTECUBITAL Performed at [place] [address], [place], [place] [address]    Special Requests   Final    BOTTLES DRAWN AEROBIC AND ANAEROBIC Blood Culture results may not be optimal due to an excessive volume of blood received in culture bottles Performed at [place] [address], [place], [place] [address]    Culture  Setup Time   Final    GRAM NEGATIVE RODS ANAEROBIC BOTTLE ONLY CRITICAL RESULT CALLED TO, READ BACK BY AND VERIFIED WITH: PHARMD J.GADHIA AT [id number] ON 05/16/2021 BY T.SAAD. Performed at [facility], [place] [address], [place], [place] [address]    Culture KLEBSIELLA PNEUMONIAE (A)  Final   Report Status 05/18/2021 FINAL  Final   Organism ID, Bacteria KLEBSIELLA PNEUMONIAE  Final      Susceptibility   Klebsiella pneumoniae - MIC*    AMPICILLIN >=32 RESISTANT Resistant     CEFAZOLIN <=4 SENSITIVE Sensitive     CEFEPIME <=0.12 SENSITIVE Sensitive     CEFTAZIDIME <=1 SENSITIVE Sensitive     CEFTRIAXONE <=0.25 SENSITIVE Sensitive     CIPROFLOXACIN <=0.25 SENSITIVE Sensitive     GENTAMICIN <=1 SENSITIVE Sensitive     IMIPENEM <=0.25 SENSITIVE Sensitive     TRIMETH/SULFA <=20 SENSITIVE Sensitive     AMPICILLIN/SULBACTAM 4 SENSITIVE Sensitive     PIP/TAZO <=4 SENSITIVE Sensitive     * KLEBSIELLA PNEUMONIAE  Urine Culture     Status: Abnormal    Collection Time: 05/15/21  5:10 PM   Specimen: In/Out Cath Urine  Result Value Ref Range Status   Specimen Description   Final    IN/OUT CATH URINE Performed at [place] [address] [place], [place] [address]    Special Requests   Final    NONE Performed at [facility], [place] [address], [place],  [address]    Culture >=100,000 COLONIES/mL KLEBSIELLA PNEUMONIAE (A)  Final   Report Status 05/18/2021 FINAL  Final  Organism ID, Bacteria KLEBSIELLA PNEUMONIAE (A)  Final      Susceptibility   Klebsiella pneumoniae - MIC*    AMPICILLIN >=32 RESISTANT Resistant     CEFAZOLIN <=4 SENSITIVE Sensitive     CEFEPIME <=0.12 SENSITIVE Sensitive     CEFTRIAXONE <=0.25 SENSITIVE Sensitive     CIPROFLOXACIN <=0.25 SENSITIVE Sensitive     GENTAMICIN <=1 SENSITIVE Sensitive     IMIPENEM <=0.25 SENSITIVE Sensitive     NITROFURANTOIN 64 INTERMEDIATE Intermediate     TRIMETH/SULFA <=20 SENSITIVE Sensitive     AMPICILLIN/SULBACTAM 8 SENSITIVE Sensitive     PIP/TAZO <=4 SENSITIVE Sensitive     * >=100,000 COLONIES/mL KLEBSIELLA PNEUMONIAE  Resp Panel by RT-PCR (Flu A&B, Covid)     Status: None   Collection Time: 05/15/21  5:10 PM   Specimen: Nasopharyngeal(NP) swabs in vial transport medium  Result Value Ref Range Status   SARS Coronavirus 2 by RT PCR NEGATIVE NEGATIVE Final    Comment: (NOTE) SARS-CoV-2 target nucleic acids are NOT DETECTED.  The SARS-CoV-2 RNA is generally detectable in upper respiratory specimens during the acute phase of infection. The lowest concentration of SARS-CoV-2 viral copies this assay can detect is 138 copies/mL. A negative result does not preclude SARS-Cov-2 infection and should not be used as the sole basis for treatment or other patient management decisions. A negative result may occur with  improper specimen collection/handling, submission of specimen other than nasopharyngeal swab, presence of viral  mutation(s) within the areas targeted by this assay, and inadequate number of viral copies(<138 copies/mL). A negative result must be combined with clinical observations, patient history, and epidemiological information. The expected result is Negative.  Fact Sheet for Patients:  EntrepreneurPulse.com.au  Fact Sheet for Healthcare Providers:  IncredibleEmployment.be  This test is no t yet approved or cleared by the [place] FDA and  has been authorized for detection and/or diagnosis of SARS-CoV-2 by FDA under an Emergency Use Authorization (EUA). This EUA will remain  in effect (meaning this test can be used) for the duration of the COVID-19 declaration under Section 564(b)(1) of the Act, 21 U.S.C.section 360bbb-3(b)(1), unless the authorization is terminated  or revoked sooner.       Influenza A by PCR NEGATIVE NEGATIVE Final   Influenza B by PCR NEGATIVE NEGATIVE Final    Comment: (NOTE) The Xpert Xpress SARS-CoV-2/FLU/RSV plus assay is intended as an aid in the diagnosis of influenza from Nasopharyngeal swab specimens and should not be used as a sole basis for treatment. Nasal washings and aspirates are unacceptable for Xpert Xpress SARS-CoV-2/FLU/RSV testing.  Fact Sheet for Patients: EntrepreneurPulse.com.au  Fact Sheet for Healthcare Providers: IncredibleEmployment.be  This test is not yet approved or cleared by the [place] FDA and has been authorized for detection and/or diagnosis of SARS-CoV-2 by FDA under an Emergency Use Authorization (EUA). This EUA will remain in effect (meaning this test can be used) for the duration of the COVID-19 declaration under Section 564(b)(1) of the Act, 21 U.S.C. section 360bbb-3(b)(1), unless the authorization is terminated or revoked.  Performed at [facility], [place] [address] [place], [place] [address]   Blood Culture ID Panel  (Reflexed)     Status: Abnormal   Collection Time: 05/15/21  5:10 PM  Result Value Ref Range Status   Enterococcus faecalis NOT DETECTED NOT DETECTED Final   Enterococcus Faecium NOT DETECTED NOT DETECTED Final   Listeria monocytogenes NOT DETECTED NOT DETECTED Final   Staphylococcus species NOT DETECTED NOT DETECTED  Final   Staphylococcus aureus (BCID) NOT DETECTED NOT DETECTED Final   Staphylococcus epidermidis NOT DETECTED NOT DETECTED Final   Staphylococcus lugdunensis NOT DETECTED NOT DETECTED Final   Streptococcus species NOT DETECTED NOT DETECTED Final   Streptococcus agalactiae NOT DETECTED NOT DETECTED Final   Streptococcus pneumoniae NOT DETECTED NOT DETECTED Final   Streptococcus pyogenes NOT DETECTED NOT DETECTED Final   A.calcoaceticus-baumannii NOT DETECTED NOT DETECTED Final   Bacteroides fragilis NOT DETECTED NOT DETECTED Final   Enterobacterales DETECTED (A) NOT DETECTED Final    Comment: Enterobacterales represent a large order of gram negative bacteria, not a single organism. CRITICAL RESULT CALLED TO, READ BACK BY AND VERIFIED WITH: PHARMD J.GADHIA AT [id number] ON 05/16/2021 BY T.SAAD.    Enterobacter cloacae complex NOT DETECTED NOT DETECTED Final   Escherichia coli NOT DETECTED NOT DETECTED Final   Klebsiella aerogenes NOT DETECTED NOT DETECTED Final   Klebsiella oxytoca NOT DETECTED NOT DETECTED Final   Klebsiella pneumoniae DETECTED (A) NOT DETECTED Final    Comment: CRITICAL RESULT CALLED TO, READ BACK BY AND VERIFIED WITH: PHARMD J.GADHIA AT 1259 ON 05/16/2021 BY T.SAAD.    Proteus species NOT DETECTED NOT DETECTED Final   Salmonella species NOT DETECTED NOT DETECTED Final   Serratia marcescens NOT DETECTED NOT DETECTED Final   Haemophilus influenzae NOT DETECTED NOT DETECTED Final   Neisseria meningitidis NOT DETECTED NOT DETECTED Final   Pseudomonas aeruginosa NOT DETECTED NOT DETECTED Final   Stenotrophomonas maltophilia NOT DETECTED NOT DETECTED Final    Candida albicans NOT DETECTED NOT DETECTED Final   Candida auris NOT DETECTED NOT DETECTED Final   Candida glabrata NOT DETECTED NOT DETECTED Final   Candida krusei NOT DETECTED NOT DETECTED Final   Candida parapsilosis NOT DETECTED NOT DETECTED Final   Candida tropicalis NOT DETECTED NOT DETECTED Final   Cryptococcus neoformans/gattii NOT DETECTED NOT DETECTED Final   CTX-M ESBL NOT DETECTED NOT DETECTED Final   Carbapenem resistance IMP NOT DETECTED NOT DETECTED Final   Carbapenem resistance KPC NOT DETECTED NOT DETECTED Final   Carbapenem resistance NDM NOT DETECTED NOT DETECTED Final   Carbapenem resist OXA 48 LIKE NOT DETECTED NOT DETECTED Final   Carbapenem resistance VIM NOT DETECTED NOT DETECTED Final    Comment: Performed at [facility], [address], [place], [place] [address]  Culture, blood (routine x 2)     Status: None (Preliminary result)   Collection Time: 05/17/21 11:50 AM   Specimen: BLOOD  Result Value Ref Range Status   Specimen Description   Final    BLOOD RIGHT ANTECUBITAL Performed at [place] [address], [place], [place] [address]    Special Requests   Final    BOTTLES DRAWN AEROBIC AND ANAEROBIC BCLV Performed at [place] [address], [place], [place] [address]    Culture   Final    NO GROWTH < 24 HOURS Performed at [place] [address], [place], [place] [address]    Report Status PENDING  Incomplete  Culture, blood (routine x 2)     Status: None (Preliminary result)   Collection Time: 05/17/21 11:50 AM   Specimen: BLOOD  Result Value Ref Range Status   Specimen Description   Final    BLOOD RT WRIST Performed at [place] [address], [place],  [address]    Special Requests   Final    BOTTLES DRAWN AEROBIC AND ANAEROBIC Blood Culture results may  not be optimal due to an inadequate volume of blood received in culture bottles Performed at  [place] [address] [place], [place] [address]    Culture   Final    NO GROWTH < 24 HOURS Performed at [place] [address] [place], [place] [address]    Report Status PENDING  Incomplete    Renal Function: Recent Labs    05/15/21 1709 05/16/21 0451 05/17/21 0456 05/18/21 0525  CREATININE 1.02* 1.46* 1.60* 2.11*   Estimated Creatinine Clearance: 22.9 mL/min (A) (by C-G formula based on SCr of 2.11 mg/dL (H)).  Radiologic Imaging: CT ABDOMEN PELVIS WO CONTRAST  Result Date: 05/17/2021 CLINICAL DATA:  Abdominal pain, gram-negative bacteremia. EXAM: CT ABDOMEN AND PELVIS WITHOUT CONTRAST TECHNIQUE: Multidetector CT imaging of the abdomen and pelvis was performed following the standard protocol without IV contrast. COMPARISON:  09/09/2012 CT abdomen FINDINGS: Lower chest: Descending thoracic aortic atherosclerotic calcification. Low-density blood pool suggesting anemia. Small bilateral pleural effusions. Peripheral atelectasis in the right lower lobe. Airway thickening is present, suggesting bronchitis or reactive airways disease. Calcified granuloma in the lingula (benign). Hepatobiliary: Cholecystectomy.  Otherwise unremarkable. Pancreas: Unremarkable Spleen: Unremarkable Adrenals/Urinary Tract: 1.0 cm nodule of the lateral limb left adrenal gland is unchanged from 09/09/2012 hence likely benign, but with indeterminate density. Substantial bilateral perirenal stranding along with moderate to prominent bilateral hydronephrosis and hydroureter. There is pelvic floor laxity with a cystocele and with the distal ureters extending down well below the pubococcygeal line in presumed herniated bladder. This extends down beyond the inferior margin of today's examination. Both kidneys appear expanded and with multiple cystic lesions. Some of the lesions in the kidneys are hyperdense likely reflecting complex cysts although technically nonspecific. Stable findings  of partial nephrectomy along the right kidney lower pole. Stomach/Bowel: Low position of the anorectal junction due to pelvic floor laxity. Vascular/Lymphatic: Aortoiliac atherosclerotic vascular disease. Reproductive: Hysterectomy. Nonvisualization of the vagina probably attributable to the severe pelvic floor laxity. Other: Small amount of free pelvic fluid in the paracolic gutters. Presacral edema. Subcutaneous edema. Low-grade central mesenteric edema. Musculoskeletal: Posterolateral rod and pedicle screw fixation bilaterally at L3-4. Solid interbody fusion at L4-5. Posterior decompression from L3 through L5. Small supraumbilical hernia containing adipose tissue. IMPRESSION: 1. Prominent bilateral hydronephrosis and hydroureter with prominent pelvic floor laxity allowing the urinary bladder and UVJ region to herniate well below the level of the ischial tuberosities. I suspect the prominent cystocele may be causing some traction on the ureterovesical junctions leading to the bilateral obstruction. 2. There is substantial bilateral perirenal stranding along with scattered renal lesions of varying density which are probably cysts but technically nonspecific. 3. Third spacing of fluid with a small amount of ascites, mesenteric edema, small bilateral pleural effusions, as well as subcutaneous edema and presacral edema. 4. Low position of the anorectal junction. Nonvisualization of the vagina related to pelvic floor laxity. 5. Low-density blood pool suggests anemia. 6. Airway thickening is present, suggesting bronchitis or reactive airways disease. 7.  Aortic Atherosclerosis (ICD10-I70.0). 8. Small supraumbilical hernia contains adipose tissue. 9. Chronically stable small left adrenal mass, benign. Electronically Signed   By: Van Clines M.D.   On: 05/17/2021 19:33   DG CHEST PORT 1 VIEW  Result Date: 05/17/2021 CLINICAL DATA:  History of pneumonia. EXAM: PORTABLE CHEST 1 VIEW COMPARISON:  Chest x-ray  10/2020. FINDINGS: Mediastinum and hilar structures normal. Heart size normal. Mild right infrahilar/right base subsegmental atelectasis. No pleural effusion or pneumothorax. No acute  bony abnormality. IMPRESSION: Mild right infrahilar/right base subsegmental atelectasis. Electronically Signed   By: Wiggins   On: 05/17/2021 11:45    Culture, Urine -  05/15/2021 at [facility] Notes: Performed By: [facility]	[place], [facility], [place], [place], [place], [address]; Lab Dir: Vedia Pereyra, [phone] >100,000 C/ML Lonell Grandchild NEGATIVE RODS; ID AND SENSITIVITY TO FOLLOW ROrganism 1 - N  Notes: Performed By: [facility], [address] [place], [place], [place], [address]; Lab Dir: Vedia Pereyra, [phone] . Antimicrobial Susceptibility and Organism Identification Report ource : URINE CULTURE Iso/Result : 01 Escherichia coli ESBL Antimicrobic/Dose MIC SYSTEMIC URINE --------------------------------------------------------------- Amp/Sulbactam 16/8 I I Amikacin <16 S S Ampicillin >16 R* R* Amox/K Clav <8/4 S S Aztreonam >16 ESBL ESBL Ceftriaxone >32 ESBL ESBL Ceftazidime >16 ESBL ESBL Cefotaxime >32 ESBL ESBL Cefazolin >16 R* R* Ciprofloxacin >2 R R Cefepime >16 R* R* Cefotetan <16 S S Ertapenem <0.5 S S Nitrofurantoin <32 S Gentamicin <4 S S Levofloxacin >4 R R Meropenem <1 S S Pip/Tazo <16 S S Trimeth/Sulfa <2/38 S S Tetracycline <4 S S Tobramycin <4 S S NON-REACTIVE  I independently reviewed the above imaging studies.  Impression/Recommendation: #1.  Cystocele: She has a planned robotic assisted laparoscopic sacrocolpopexy with Dr. Louis Meckel on 05/24/2021.  Given her UTI with bacteremia, this case may be delayed.  I will send Dr. Louis Meckel a message.  2.  Bilateral hydroureteronephrosis: Seen on CT A/P 05/17/2021 to the bilateral UVJ likely caused by her cystocele.  Foley catheter is in place and has been draining 1.5 L which is adequate.  I recommend cystoscopy, bilateral  ureteral stent placement for maximal decompression.  3.  Acute kidney injury: Her creatinine on 05/18/2021 is 2.1 elevated from baseline of 1.  We will plan for bilateral stents as above.  4. UTI: Ucx 05/15/2021 with ESBL E. coli in the office and Klebsiella in the hospital.  She was on ancef at time of consultation however, I note that her Ucx 8/1 with ESBL E coli was resistence to ancef. I will switch to gentamicin which is sensitive on both urine cultures. Urine cx data from office pasted above.  -The risks, benefits and alternatives of cystoscopy with bilateral JJ stent placement was discussed with the patient.  Risks include, but are not limited to: bleeding, urinary tract infection, ureteral injury, ureteral stricture disease, chronic pain, urinary symptoms, bladder injury, stent migration, the need for nephrostomy tube placement, MI, CVA, DVT, PE and the inherent risks with general anesthesia.  The patient voices understanding and wishes to proceed.    Matt R. Effrey Davidow MD 05/18/2021, 4:27 PM  [facility]  Pager: [phone]  CC: Barb Merino, MD

## 2021-05-18 NOTE — Transfer of Care (Signed)
Immediate Anesthesia Transfer of Care Note  Patient: Theresa Holloway  Procedure(s) Performed: CYSTOSCOPY, BILATERAL RETROGRADE PYELOGRAM,  RIGHT STENT PLACEMENT, ATTEMPTED LEFT STENT PLACEMENT (Bilateral)  Patient Location: PACU  Anesthesia Type:General  Level of Consciousness: awake, drowsy and patient cooperative  Airway & Oxygen Therapy: Patient Spontanous Breathing and Patient connected to face mask oxygen  Post-op Assessment: Report given to RN, Post -op Vital signs reviewed and stable and Patient moving all extremities X 4  Post vital signs: stable  Last Vitals:  Vitals Value Taken Time  BP 117/46 05/18/21 1903  Temp    Pulse 92 05/18/21 1912  Resp 30 05/18/21 1912  SpO2 98 % 05/18/21 1912  Vitals shown include unvalidated device data.  Last Pain:  Vitals:   05/18/21 1600  TempSrc: Oral  PainSc:       Patients Stated Pain Goal: 2 ([id number])  Complications: No notable events documented.

## 2021-05-18 NOTE — Progress Notes (Signed)
PHARMACY NOTE:  ANTIMICROBIAL RENAL DOSAGE ADJUSTMENT  Current antimicrobial regimen includes a mismatch between antimicrobial dosage and estimated renal function.  As per policy approved by the Pharmacy & Therapeutics and Medical Executive Committees, the antimicrobial dosage will be adjusted accordingly.  Current antimicrobial dosage:  Ancef 2 gr IV q8h   Indication: UTI, bacteremia   Renal Function:  Estimated Creatinine Clearance: 22.9 mL/min (A) (by C-G formula based on SCr of 2.11 mg/dL (H)). '[]'$      On intermittent HD, scheduled: '[]'$      On CRRT    Antimicrobial dosage has been changed to:   - Ancef 2 gr IV q12h   Additional comments:    Royetta Asal, PharmD, BCPS 05/18/2021 7:46 AM

## 2021-05-18 NOTE — Progress Notes (Signed)
   05/18/21 0350  Assess: MEWS Score  Temp (!) 101.8 F (38.8 C)  BP (!) 120/54  Pulse Rate 100  Resp 18  SpO2 91 %  O2 Device Room Air  Assess: MEWS Score  MEWS Temp 2  MEWS Systolic 0  MEWS Pulse 0  MEWS RR 0  MEWS LOC 0  MEWS Score 2  MEWS Score Color Yellow  Assess: if the MEWS score is Yellow or Red  Were vital signs taken at a resting state? Yes  Focused Assessment Change from prior assessment (see assessment flowsheet)  Does the patient meet 2 or more of the SIRS criteria? Yes  Does the patient have a confirmed or suspected source of infection? Yes  Provider and Rapid Response Notified? Yes  MEWS guidelines implemented *See Row Information* Yes  Treat  MEWS Interventions Administered prn meds/treatments  Pain Scale 0-10  Pain Score 0  Take Vital Signs  Increase Vital Sign Frequency  Yellow: Q 2hr X 2 then Q 4hr X 2, if remains yellow, continue Q 4hrs  Escalate  MEWS: Escalate Yellow: discuss with charge nurse/RN and consider discussing with provider and RRT  Notify: Charge Nurse/RN  Name of Charge Nurse/RN Notified T Abe Schools  Date Charge Nurse/RN Notified 05/18/21  Time Charge Nurse/RN Notified [id number]  Notify: Provider  Provider Name/Title Olena Heckle  Date Provider Notified 05/18/21  Time Provider Notified 0400  Notification Type Page  Notification Reason Change in status  Provider response No new orders  Date of Provider Response 05/18/21  Time of Provider Response 0415  Notify: Rapid Response  Name of Rapid Response RN Notified Mandy  Date Rapid Response Notified 05/18/21  Time Rapid Response Notified 0503  Document  Patient Outcome Stabilized after interventions  Progress note created (see row info) Yes  Assess: SIRS CRITERIA  SIRS Temperature  1  SIRS Pulse 1  SIRS Respirations  0  SIRS WBC 0  SIRS Score Sum  2

## 2021-05-18 NOTE — Anesthesia Preprocedure Evaluation (Signed)
Anesthesia Evaluation  Patient identified by MRN, date of birth, ID band Patient awake    Reviewed: Allergy & Precautions, NPO status , Patient's Chart, lab work & pertinent test results  History of Anesthesia Complications (+) PONV and history of anesthetic complications  Airway Mallampati: II  TM Distance: >3 FB Neck ROM: Full    Dental no notable dental hx.    Pulmonary asthma , COPD,    Pulmonary exam normal breath sounds clear to auscultation       Cardiovascular negative cardio ROS Normal cardiovascular exam Rhythm:Regular Rate:Normal     Neuro/Psych  Headaches,    GI/Hepatic Neg liver ROS, GERD  ,  Endo/Other  Hypothyroidism   Renal/GU negative Renal ROS  negative genitourinary   Musculoskeletal negative musculoskeletal ROS (+)   Abdominal   Peds negative pediatric ROS (+)  Hematology negative hematology ROS (+)   Anesthesia Other Findings   Reproductive/Obstetrics negative OB ROS                            Anesthesia Physical Anesthesia Plan  ASA: 2 and emergent  Anesthesia Plan: General   Post-op Pain Management:    Induction: Intravenous  PONV Risk Score and Plan: Midazolam, Treatment may vary due to age or medical condition, Ondansetron and Dexamethasone  Airway Management Planned: LMA  Additional Equipment:   Intra-op Plan:   Post-operative Plan: Extubation in OR  Informed Consent: I have reviewed the patients History and Physical, chart, labs and discussed the procedure including the risks, benefits and alternatives for the proposed anesthesia with the patient or authorized representative who has indicated his/her understanding and acceptance.     Dental advisory given  Plan Discussed with: CRNA and Anesthesiologist  Anesthesia Plan Comments: (No nausea/vomiting. GA/LMA. Patient states she has not had a problem with fentanyl for pain. Norton Blizzard, MD  )         Anesthesia Quick Evaluation

## 2021-05-18 NOTE — Progress Notes (Signed)
Received pt from PACU. Pt A&O x 4. Denies any pain. Pt has foley catheter with cloudy urine, no bleeding noted. VSS.

## 2021-05-18 NOTE — Anesthesia Postprocedure Evaluation (Signed)
Anesthesia Post Note  Patient: Alvenia Varella Gali  Procedure(s) Performed: CYSTOSCOPY, BILATERAL RETROGRADE PYELOGRAM,  RIGHT STENT PLACEMENT, ATTEMPTED LEFT STENT PLACEMENT (Bilateral)     Patient location during evaluation: PACU Anesthesia Type: General Level of consciousness: awake Pain management: pain level controlled Vital Signs Assessment: post-procedure vital signs reviewed and stable Respiratory status: spontaneous breathing and respiratory function stable Cardiovascular status: stable Postop Assessment: no apparent nausea or vomiting Anesthetic complications: no   No notable events documented.  Last Vitals:  Vitals:   05/18/21 2015 05/18/21 2035  BP: (!) 124/55 (!) 136/58  Pulse: 89 93  Resp: 14 16  Temp:  36.7 C  SpO2: 96% 97%    Last Pain:  Vitals:   05/18/21 2035  TempSrc: Oral  PainSc:                  Merlinda Frederick

## 2021-05-18 NOTE — Progress Notes (Signed)
PHARMACY NOTE:  ANTIMICROBIAL RENAL DOSAGE ADJUSTMENT  Current antimicrobial regimen includes a mismatch between antimicrobial dosage and estimated renal function.  As per policy approved by the Pharmacy & Therapeutics and Medical Executive Committees, the antimicrobial dosage will be adjusted accordingly.  Current antimicrobial dosage:  gentamicin 2.5 mg/kg q 12 h  Indication: UTI/bacteremia   Renal Function:  Estimated Creatinine Clearance: 22.9 mL/min (A) (by C-G formula based on SCr of 2.11 mg/dL (H)). '[]'$      On intermittent HD, scheduled: '[]'$      On CRRT    Antimicrobial dosage has been changed to:  gentamicin 5 mg/kg q 12 h  Additional comments: Will ask for gentamicin dosing per pharmacy tomorrow and plan on checking a level due to AKI  Thank you for allowing pharmacy to be a part of this patient's care.  Napoleon Form, [facility] 05/18/2021 5:00 PM

## 2021-05-19 ENCOUNTER — Encounter (HOSPITAL_COMMUNITY): Payer: Self-pay | Admitting: Urology

## 2021-05-19 ENCOUNTER — Inpatient Hospital Stay (HOSPITAL_COMMUNITY): Payer: 59

## 2021-05-19 DIAGNOSIS — N390 Urinary tract infection, site not specified: Secondary | ICD-10-CM | POA: Diagnosis not present

## 2021-05-19 DIAGNOSIS — A419 Sepsis, unspecified organism: Secondary | ICD-10-CM | POA: Diagnosis not present

## 2021-05-19 HISTORY — PX: IR NEPHROSTOMY PLACEMENT LEFT: IMG6063

## 2021-05-19 LAB — CBC WITH DIFFERENTIAL/PLATELET
Abs Immature Granulocytes: 1.26 10*3/uL — ABNORMAL HIGH (ref 0.00–0.07)
Basophils Absolute: 0.1 10*3/uL (ref 0.0–0.1)
Basophils Relative: 0 %
Eosinophils Absolute: 0 10*3/uL (ref 0.0–0.5)
Eosinophils Relative: 0 %
HCT: 31.4 % — ABNORMAL LOW (ref 36.0–46.0)
Hemoglobin: 10.1 g/dL — ABNORMAL LOW (ref 12.0–15.0)
Immature Granulocytes: 4 %
Lymphocytes Relative: 3 %
Lymphs Abs: 0.8 10*3/uL (ref 0.7–4.0)
MCH: 28.2 pg (ref 26.0–34.0)
MCHC: 32.2 g/dL (ref 30.0–36.0)
MCV: 87.7 fL (ref 80.0–100.0)
Monocytes Absolute: 0.6 10*3/uL (ref 0.1–1.0)
Monocytes Relative: 2 %
Neutro Abs: 27.4 10*3/uL — ABNORMAL HIGH (ref 1.7–7.7)
Neutrophils Relative %: 91 %
Platelets: 700 10*3/uL — ABNORMAL HIGH (ref 150–400)
RBC: 3.58 MIL/uL — ABNORMAL LOW (ref 3.87–5.11)
RDW: 14.8 % (ref 11.5–15.5)
WBC: 30.2 10*3/uL — ABNORMAL HIGH (ref 4.0–10.5)
nRBC: 0 % (ref 0.0–0.2)

## 2021-05-19 LAB — MAGNESIUM: Magnesium: 1.9 mg/dL (ref 1.7–2.4)

## 2021-05-19 LAB — BASIC METABOLIC PANEL
Anion gap: 12 (ref 5–15)
BUN: 19 mg/dL (ref 8–23)
CO2: 16 mmol/L — ABNORMAL LOW (ref 22–32)
Calcium: 8.5 mg/dL — ABNORMAL LOW (ref 8.9–10.3)
Chloride: 110 mmol/L (ref 98–111)
Creatinine, Ser: 1.88 mg/dL — ABNORMAL HIGH (ref 0.44–1.00)
GFR, Estimated: 29 mL/min — ABNORMAL LOW (ref >60)
Glucose, Bld: 134 mg/dL — ABNORMAL HIGH (ref 70–99)
Potassium: 3.9 mmol/L (ref 3.5–5.1)
Sodium: 138 mmol/L (ref 135–145)

## 2021-05-19 LAB — PHOSPHORUS: Phosphorus: 3.8 mg/dL (ref 2.5–4.6)

## 2021-05-19 LAB — GENTAMICIN LEVEL, RANDOM: Gentamicin Rm: 7.3 ug/mL

## 2021-05-19 MED ORDER — ONDANSETRON HCL 4 MG/2ML IJ SOLN: INTRAMUSCULAR | Status: AC | PRN

## 2021-05-19 MED ORDER — ONDANSETRON HCL 4 MG/2ML IJ SOLN
INTRAMUSCULAR | Status: AC
  Administered 2021-05-19: 4 mg via INTRAVENOUS
  Filled 2021-05-19: qty 2

## 2021-05-19 MED ORDER — LIDOCAINE HCL (PF) 1 % IJ SOLN
INTRAMUSCULAR | Status: AC | PRN
  Administered 2021-05-19: 10 mL via INTRADERMAL

## 2021-05-19 MED ORDER — FENTANYL CITRATE (PF) 100 MCG/2ML IJ SOLN
INTRAMUSCULAR | Status: AC | PRN
  Administered 2021-05-19 (×2): 50 ug via INTRAVENOUS

## 2021-05-19 MED ORDER — MIDAZOLAM HCL 2 MG/2ML IJ SOLN
INTRAMUSCULAR | Status: AC | PRN
  Administered 2021-05-19 (×4): 1 mg via INTRAVENOUS

## 2021-05-19 MED ORDER — MIDAZOLAM HCL 2 MG/2ML IJ SOLN
INTRAMUSCULAR | Status: AC
  Filled 2021-05-19: qty 4

## 2021-05-19 MED ORDER — SODIUM CHLORIDE 0.9 % IV SOLN
1.0000 g | Freq: Two times a day (BID) | INTRAVENOUS | Status: DC
  Administered 2021-05-19 – 2021-05-21 (×5): 1 g via INTRAVENOUS
  Filled 2021-05-19 (×5): qty 1

## 2021-05-19 MED ORDER — IOHEXOL 300 MG/ML  SOLN
50.0000 mL | Freq: Once | INTRAMUSCULAR | Status: AC | PRN
  Administered 2021-05-19: 20 mL

## 2021-05-19 MED ORDER — FENTANYL CITRATE (PF) 100 MCG/2ML IJ SOLN
25.0000 ug | INTRAMUSCULAR | Status: DC | PRN
  Administered 2021-05-19 – 2021-05-24 (×8): 25 ug via INTRAVENOUS
  Filled 2021-05-19 (×8): qty 2

## 2021-05-19 MED ORDER — LIDOCAINE HCL 1 % IJ SOLN
INTRAMUSCULAR | Status: AC
  Filled 2021-05-19: qty 20

## 2021-05-19 MED ORDER — GENTAMICIN SULFATE 40 MG/ML IJ SOLN: 280.0000 mg | INTRAVENOUS | Status: DC

## 2021-05-19 MED ORDER — FENTANYL CITRATE (PF) 100 MCG/2ML IJ SOLN
INTRAMUSCULAR | Status: AC
  Filled 2021-05-19: qty 2

## 2021-05-19 NOTE — Progress Notes (Signed)
Pt off the floor and down to IR.

## 2021-05-19 NOTE — Procedures (Signed)
Interventional Radiology Procedure:   Indications: Left hydronephrosis due to cystocele.  Unable to place a retrograde left ureter stent  Procedure: Left nephrostomy tube placement  Findings: 10 Fr nephrostomy placed thru mid pole calyx and catheter tip in renal pelvis.  Left renal collecting system was decompressed after drain placement.  Again noted is contrast extravasation from left renal collecting system (similar to retrograde images).   Complications: No immediate complications noted.     EBL: Minimal  Plan: Follow drain output.  Can convert to antegrade ureter stent per Urology recommendations because patient is supposed to have Urologic surgery in near future.     Erlinda Solinger R. Anselm Pancoast, MD  Pager: [phone]

## 2021-05-19 NOTE — Progress Notes (Signed)
PROGRESS NOTE    Theresa Holloway  [id number] DOB: Feb 05, 1951 DOA: 05/15/2021 PCP: Sharilyn Sites, MD    Brief Narrative:  70 year old female with history of COPD, asthma, anxiety, history of partial nephrectomy for left-sided renal cancer presented to the emergency room with fever, dysuria and hematuria.  Patient has been suffering from bladder prolapse and followed up with urology with planned sling surgery on 8/10.  Patient had frequency and dysuria and was given Foley catheter on urology visit on 8/1.  Urine culture taken on 7/28 for preop eval was positive for ESBL E. coli.  Blood cultures on 8/1 positive for Klebsiella.  Urine culture also with Klebsiella on repeat exam. Urine culture from office on 7/28 positive for ESBL E. coli. With persistent elevated white count found to have bilateral severe hydronephrosis. Cystoscopy on 8/4, stenting right ureter.  Left ureter unable to stent.  IR consulted for percutaneous nephrostomy on the left side.   Assessment & Plan:   Principal Problem:   Sepsis secondary to UTI [facility]) Active Problems:   Sepsis ([place])   Hypokalemia   AKI (acute kidney injury) ([place])   Bladder prolapse, female, acquired  Sepsis secondary to Klebsiella bacteremia/Klebsiella UTI: Recent ESBL E. coli. Treated with different antibiotics.  Urine culture sensitivity available from the office with ESBL. Given 1 dose of gentamicin.  Will change to meropenem to decrease nephrotoxicity.  All cultures sensitive to meropenem. Severe bilateral hydronephrosis with prolapsed bladder Status post cystoscopy, right-sided stenting of the ureter.  Unable to stent left side. IR consulted.  For percutaneous nephrostomy today. Patient has prolapsed bladder and is planned for sling procedure, hopefully after improvement of acute infection.   Acute kidney injury: Due to #1.  Continue IV fluid resuscitation.  Some improvement of renal functions today.  Continue to  monitor.  Hypokalemia/hypomagnesemia: Replaced with improvement.  Hypothyroidism: Euthyroid on replacement.  Continue.   DVT prophylaxis: Place and maintain sequential compression device Start: 05/18/21 1146 SCD   Code Status: Full code Family Communication: none today .  Disposition Plan: Status is: Inpatient  Remains inpatient appropriate because:Inpatient level of care appropriate due to severity of illness  Dispo: The patient is from: Home              Anticipated d/c is to: Home              Patient currently is not medically stable to d/c.   Difficult to place patient No         Consultants:  Urology  Procedures:  None  Antimicrobials:  Ancef 8/1--- 8/4 Gentamicin 8/4 Meropenem 8/5----   Subjective: Patient seen and examined.  Overnight had abdominal distention and pain.  Denies any nausea vomiting.  Afebrile.  WBC count elevated to 30,000.  Looking forward for another procedure today.  Objective: Vitals:   05/18/21 2035 05/19/21 0454 05/19/21 0723 05/19/21 1237  BP: (!) 136/58 121/61  (!) 124/59  Pulse: 93 89  88  Resp: 16 16    Temp: 98 F (36.7 C) 97.7 F (36.5 C)  97.9 F (36.6 C)  TempSrc: Oral Oral  Oral  SpO2: 97% 95% 93% 96%  Weight:      Height:        Intake/Output Summary (Last 24 hours) at 05/19/2021 1251 Last data filed at 05/19/2021 0400 Gross per 24 hour  Intake 1757 ml  Output 1900 ml  Net -143 ml   Filed Weights   05/15/21 1617 05/18/21 1544  Weight: 56.9 kg 56.9  kg    Examination:  General: In mild distress and anxious.  On room air and comfortable on conversation. Cardiovascular: S1-S2 normal.  No added sounds. Respiratory: Bilateral clear.  No added sounds. Gastrointestinal: Soft.  Mildly distended.  She has some point tenderness along the right costophrenic angle.  Foley catheter with clear urine. Ext: No swelling or edema.  No cyanosis. Neuro: Grossly normal. Musculoskeletal: No deformities.      Data  Reviewed: I have personally reviewed following labs and imaging studies  CBC: Recent Labs  Lab 05/15/21 1709 05/16/21 0814 05/17/21 0456 05/18/21 0525 05/19/21 0509  WBC 25.5* 23.8* 21.7* 18.3* 30.2*  NEUTROABS 22.3*  --   --  15.9* 27.4*  HGB 10.0* 9.6* 8.6* 8.4* 10.1*  HCT 31.0* 30.5* 27.0* 26.1* 31.4*  MCV 87.8 89.2 89.4 89.1 87.7  PLT 615* 557* 520* 512* [id number]*   Basic Metabolic Panel: Recent Labs  Lab 05/15/21 1709 05/16/21 0451 05/17/21 0456 05/18/21 0525 05/19/21 0509  NA 134* 131* 132* 131* 138  K 2.8* 3.0* 3.5 3.5 3.9  CL 101 104 103 106 110  CO2 22 18* 19* 18* 16*  GLUCOSE 100* 89 115* 109* 134*  BUN '14 17 17 17 19  '$ CREATININE 1.02* 1.46* 1.60* 2.11* 1.88*  CALCIUM 8.9 7.2* 7.7* 7.5* 8.5*  MG  --   --  1.5* 1.9 1.9  PHOS  --   --   --  2.9 3.8   GFR: Estimated Creatinine Clearance: 25.7 mL/min (A) (by C-G formula based on SCr of 1.88 mg/dL (H)). Liver Function Tests: Recent Labs  Lab 05/15/21 1709  AST 16  ALT 17  ALKPHOS 106  BILITOT 0.6  PROT 7.1  ALBUMIN 2.9*   No results for input(s): LIPASE, AMYLASE in the last 168 hours. No results for input(s): AMMONIA in the last 168 hours. Coagulation Profile: Recent Labs  Lab 05/15/21 1709 05/18/21 1945  INR 1.0 1.2   Cardiac Enzymes: No results for input(s): CKTOTAL, CKMB, CKMBINDEX, TROPONINI in the last 168 hours. BNP (last 3 results) No results for input(s): PROBNP in the last 8760 hours. HbA1C: No results for input(s): HGBA1C in the last 72 hours. CBG: No results for input(s): GLUCAP in the last 168 hours. Lipid Profile: No results for input(s): CHOL, HDL, LDLCALC, TRIG, CHOLHDL, LDLDIRECT in the last 72 hours. Thyroid Function Tests: No results for input(s): TSH, T4TOTAL, FREET4, T3FREE, THYROIDAB in the last 72 hours. Anemia Panel: No results for input(s): VITAMINB12, FOLATE, FERRITIN, TIBC, IRON, RETICCTPCT in the last 72 hours. Sepsis Labs: Recent Labs  Lab 05/15/21 1709  05/16/21 0814  LATICACIDVEN 1.1 1.2    Recent Results (from the past 240 hour(s))  Urine culture     Status: Abnormal   Collection Time: 05/11/21 10:10 AM   Specimen: Urine, Clean Catch  Result Value Ref Range Status   Specimen Description   Final    URINE, CLEAN CATCH Performed at [facility], [place] [address] [place], [place] [address]    Special Requests   Final    NONE Performed at [facility], [place] [address] [place], [place] [address]    Culture 80,000 COLONIES/mL KLEBSIELLA PNEUMONIAE (A)  Final   Report Status 05/14/2021 FINAL  Final   Organism ID, Bacteria KLEBSIELLA PNEUMONIAE (A)  Final      Susceptibility   Klebsiella pneumoniae - MIC*    AMPICILLIN >=32 RESISTANT Resistant     CEFAZOLIN <=4 SENSITIVE Sensitive     CEFEPIME <=  0.12 SENSITIVE Sensitive     CEFTRIAXONE <=0.25 SENSITIVE Sensitive     CIPROFLOXACIN <=0.25 SENSITIVE Sensitive     GENTAMICIN <=1 SENSITIVE Sensitive     IMIPENEM <=0.25 SENSITIVE Sensitive     NITROFURANTOIN 64 INTERMEDIATE Intermediate     TRIMETH/SULFA <=20 SENSITIVE Sensitive     AMPICILLIN/SULBACTAM 8 SENSITIVE Sensitive     PIP/TAZO <=4 SENSITIVE Sensitive     * 80,000 COLONIES/mL KLEBSIELLA PNEUMONIAE  Blood culture (routine single)     Status: Abnormal   Collection Time: 05/15/21  5:10 PM   Specimen: BLOOD  Result Value Ref Range Status   Specimen Description   Final    BLOOD RIGHT ANTECUBITAL Performed at [facility] [address] [place], [place] [address]    Special Requests   Final    BOTTLES DRAWN AEROBIC AND ANAEROBIC Blood Culture results may not be optimal due to an excessive volume of blood received in culture bottles Performed at [facility] [address] [place], [place] [address]    Culture  Setup Time   Final    GRAM NEGATIVE RODS ANAEROBIC BOTTLE ONLY CRITICAL RESULT CALLED TO, READ BACK BY AND VERIFIED WITH: PHARMD J.GADHIA  AT [id number] ON 05/16/2021 BY T.SAAD. Performed at [facility], [place] [address], [place], [place] [address]    Culture KLEBSIELLA PNEUMONIAE (A)  Final   Report Status 05/18/2021 FINAL  Final   Organism ID, Bacteria KLEBSIELLA PNEUMONIAE  Final      Susceptibility   Klebsiella pneumoniae - MIC*    AMPICILLIN >=32 RESISTANT Resistant     CEFAZOLIN <=4 SENSITIVE Sensitive     CEFEPIME <=0.12 SENSITIVE Sensitive     CEFTAZIDIME <=1 SENSITIVE Sensitive     CEFTRIAXONE <=0.25 SENSITIVE Sensitive     CIPROFLOXACIN <=0.25 SENSITIVE Sensitive     GENTAMICIN <=1 SENSITIVE Sensitive     IMIPENEM <=0.25 SENSITIVE Sensitive     TRIMETH/SULFA <=20 SENSITIVE Sensitive     AMPICILLIN/SULBACTAM 4 SENSITIVE Sensitive     PIP/TAZO <=4 SENSITIVE Sensitive     * KLEBSIELLA PNEUMONIAE  Urine Culture     Status: Abnormal   Collection Time: 05/15/21  5:10 PM   Specimen: In/Out Cath Urine  Result Value Ref Range Status   Specimen Description   Final    IN/OUT CATH URINE Performed at [place] [address] [place], [place] [address]    Special Requests   Final    NONE Performed at [facility], [place] [address], [place], [place] [address]    Culture >=100,000 COLONIES/mL KLEBSIELLA PNEUMONIAE (A)  Final   Report Status 05/18/2021 FINAL  Final   Organism ID, Bacteria KLEBSIELLA PNEUMONIAE (A)  Final      Susceptibility   Klebsiella pneumoniae - MIC*    AMPICILLIN >=32 RESISTANT Resistant     CEFAZOLIN <=4 SENSITIVE Sensitive     CEFEPIME <=0.12 SENSITIVE Sensitive     CEFTRIAXONE <=0.25 SENSITIVE Sensitive     CIPROFLOXACIN <=0.25 SENSITIVE Sensitive     GENTAMICIN <=1 SENSITIVE Sensitive     IMIPENEM <=0.25 SENSITIVE Sensitive     NITROFURANTOIN 64 INTERMEDIATE Intermediate     TRIMETH/SULFA <=20 SENSITIVE Sensitive     AMPICILLIN/SULBACTAM 8 SENSITIVE Sensitive     PIP/TAZO <=4 SENSITIVE Sensitive     * >=100,000 COLONIES/mL KLEBSIELLA PNEUMONIAE   Resp Panel by RT-PCR (Flu A&B, Covid)     Status: None   Collection Time: 05/15/21  5:10  PM   Specimen: Nasopharyngeal(NP) swabs in vial transport medium  Result Value Ref Range Status   SARS Coronavirus 2 by RT PCR NEGATIVE NEGATIVE Final    Comment: (NOTE) SARS-CoV-2 target nucleic acids are NOT DETECTED.  The SARS-CoV-2 RNA is generally detectable in upper respiratory specimens during the acute phase of infection. The lowest concentration of SARS-CoV-2 viral copies this assay can detect is 138 copies/mL. A negative result does not preclude SARS-Cov-2 infection and should not be used as the sole basis for treatment or other patient management decisions. A negative result may occur with  improper specimen collection/handling, submission of specimen other than nasopharyngeal swab, presence of viral mutation(s) within the areas targeted by this assay, and inadequate number of viral copies(<138 copies/mL). A negative result must be combined with clinical observations, patient history, and epidemiological information. The expected result is Negative.  Fact Sheet for Patients:  EntrepreneurPulse.com.au  Fact Sheet for Healthcare Providers:  IncredibleEmployment.be  This test is no t yet approved or cleared by the [place] FDA and  has been authorized for detection and/or diagnosis of SARS-CoV-2 by FDA under an Emergency Use Authorization (EUA). This EUA will remain  in effect (meaning this test can be used) for the duration of the COVID-19 declaration under Section 564(b)(1) of the Act, 21 U.S.C.section 360bbb-3(b)(1), unless the authorization is terminated  or revoked sooner.       Influenza A by PCR NEGATIVE NEGATIVE Final   Influenza B by PCR NEGATIVE NEGATIVE Final    Comment: (NOTE) The Xpert Xpress SARS-CoV-2/FLU/RSV plus assay is intended as an aid in the diagnosis of influenza from Nasopharyngeal swab specimens and should not be  used as a sole basis for treatment. Nasal washings and aspirates are unacceptable for Xpert Xpress SARS-CoV-2/FLU/RSV testing.  Fact Sheet for Patients: EntrepreneurPulse.com.au  Fact Sheet for Healthcare Providers: IncredibleEmployment.be  This test is not yet approved or cleared by the [place] FDA and has been authorized for detection and/or diagnosis of SARS-CoV-2 by FDA under an Emergency Use Authorization (EUA). This EUA will remain in effect (meaning this test can be used) for the duration of the COVID-19 declaration under Section 564(b)(1) of the Act, 21 U.S.C. section 360bbb-3(b)(1), unless the authorization is terminated or revoked.  Performed at [facility], [place] [address] [place], [place] [address]   Blood Culture ID Panel (Reflexed)     Status: Abnormal   Collection Time: 05/15/21  5:10 PM  Result Value Ref Range Status   Enterococcus faecalis NOT DETECTED NOT DETECTED Final   Enterococcus Faecium NOT DETECTED NOT DETECTED Final   Listeria monocytogenes NOT DETECTED NOT DETECTED Final   Staphylococcus species NOT DETECTED NOT DETECTED Final   Staphylococcus aureus (BCID) NOT DETECTED NOT DETECTED Final   Staphylococcus epidermidis NOT DETECTED NOT DETECTED Final   Staphylococcus lugdunensis NOT DETECTED NOT DETECTED Final   Streptococcus species NOT DETECTED NOT DETECTED Final   Streptococcus agalactiae NOT DETECTED NOT DETECTED Final   Streptococcus pneumoniae NOT DETECTED NOT DETECTED Final   Streptococcus pyogenes NOT DETECTED NOT DETECTED Final   A.calcoaceticus-baumannii NOT DETECTED NOT DETECTED Final   Bacteroides fragilis NOT DETECTED NOT DETECTED Final   Enterobacterales DETECTED (A) NOT DETECTED Final    Comment: Enterobacterales represent a large order of gram negative bacteria, not a single organism. CRITICAL RESULT CALLED TO, READ BACK BY AND VERIFIED WITH: PHARMD J.GADHIA AT [id number] ON  05/16/2021 BY T.SAAD.    Enterobacter cloacae complex NOT DETECTED NOT DETECTED Final  Escherichia coli NOT DETECTED NOT DETECTED Final   Klebsiella aerogenes NOT DETECTED NOT DETECTED Final   Klebsiella oxytoca NOT DETECTED NOT DETECTED Final   Klebsiella pneumoniae DETECTED (A) NOT DETECTED Final    Comment: CRITICAL RESULT CALLED TO, READ BACK BY AND VERIFIED WITH: PHARMD J.GADHIA AT 1259 ON 05/16/2021 BY T.SAAD.    Proteus species NOT DETECTED NOT DETECTED Final   Salmonella species NOT DETECTED NOT DETECTED Final   Serratia marcescens NOT DETECTED NOT DETECTED Final   Haemophilus influenzae NOT DETECTED NOT DETECTED Final   Neisseria meningitidis NOT DETECTED NOT DETECTED Final   Pseudomonas aeruginosa NOT DETECTED NOT DETECTED Final   Stenotrophomonas maltophilia NOT DETECTED NOT DETECTED Final   Candida albicans NOT DETECTED NOT DETECTED Final   Candida auris NOT DETECTED NOT DETECTED Final   Candida glabrata NOT DETECTED NOT DETECTED Final   Candida krusei NOT DETECTED NOT DETECTED Final   Candida parapsilosis NOT DETECTED NOT DETECTED Final   Candida tropicalis NOT DETECTED NOT DETECTED Final   Cryptococcus neoformans/gattii NOT DETECTED NOT DETECTED Final   CTX-M ESBL NOT DETECTED NOT DETECTED Final   Carbapenem resistance IMP NOT DETECTED NOT DETECTED Final   Carbapenem resistance KPC NOT DETECTED NOT DETECTED Final   Carbapenem resistance NDM NOT DETECTED NOT DETECTED Final   Carbapenem resist OXA 48 LIKE NOT DETECTED NOT DETECTED Final   Carbapenem resistance VIM NOT DETECTED NOT DETECTED Final    Comment: Performed at [facility], [address], [place], [place] [address]  Culture, blood (routine x 2)     Status: None (Preliminary result)   Collection Time: 05/17/21 11:50 AM   Specimen: BLOOD  Result Value Ref Range Status   Specimen Description   Final    BLOOD RIGHT ANTECUBITAL Performed at [facility] [address]  [place], [place] [address]    Special Requests   Final    BOTTLES DRAWN AEROBIC AND ANAEROBIC BCLV Performed at [facility] [address], [place], [place] [address]    Culture   Final    NO GROWTH 2 DAYS Performed at [facility] [address], [place], [place] [address]    Report Status PENDING  Incomplete  Culture, blood (routine x 2)     Status: None (Preliminary result)   Collection Time: 05/17/21 11:50 AM   Specimen: BLOOD  Result Value Ref Range Status   Specimen Description   Final    BLOOD RT WRIST Performed at [facility] [address], [place], [place] [address]    Special Requests   Final    BOTTLES DRAWN AEROBIC AND ANAEROBIC Blood Culture results may not be optimal due to an inadequate volume of blood received in culture bottles Performed at [facility] [address], [place], [place] [address]    Culture   Final    NO GROWTH 2 DAYS Performed at [facility] [address], [place], [place] [address]    Report Status PENDING  Incomplete         Radiology Studies: CT ABDOMEN PELVIS WO CONTRAST  Result Date: 05/17/2021 CLINICAL DATA:  Abdominal pain, gram-negative bacteremia. EXAM: CT ABDOMEN AND PELVIS WITHOUT CONTRAST TECHNIQUE: Multidetector CT imaging of the abdomen and pelvis was performed following the standard protocol without IV contrast. COMPARISON:  09/09/2012 CT abdomen FINDINGS: Lower chest: Descending thoracic aortic atherosclerotic calcification. Low-density blood pool suggesting anemia. Small bilateral pleural effusions. Peripheral atelectasis in the  right lower lobe. Airway thickening is present, suggesting bronchitis or reactive airways disease. Calcified granuloma in the lingula (benign). Hepatobiliary: Cholecystectomy.  Otherwise unremarkable. Pancreas: Unremarkable Spleen: Unremarkable Adrenals/Urinary Tract: 1.0 cm nodule of the lateral limb left adrenal gland is unchanged  from 09/09/2012 hence likely benign, but with indeterminate density. Substantial bilateral perirenal stranding along with moderate to prominent bilateral hydronephrosis and hydroureter. There is pelvic floor laxity with a cystocele and with the distal ureters extending down well below the pubococcygeal line in presumed herniated bladder. This extends down beyond the inferior margin of today's examination. Both kidneys appear expanded and with multiple cystic lesions. Some of the lesions in the kidneys are hyperdense likely reflecting complex cysts although technically nonspecific. Stable findings of partial nephrectomy along the right kidney lower pole. Stomach/Bowel: Low position of the anorectal junction due to pelvic floor laxity. Vascular/Lymphatic: Aortoiliac atherosclerotic vascular disease. Reproductive: Hysterectomy. Nonvisualization of the vagina probably attributable to the severe pelvic floor laxity. Other: Small amount of free pelvic fluid in the paracolic gutters. Presacral edema. Subcutaneous edema. Low-grade central mesenteric edema. Musculoskeletal: Posterolateral rod and pedicle screw fixation bilaterally at L3-4. Solid interbody fusion at L4-5. Posterior decompression from L3 through L5. Small supraumbilical hernia containing adipose tissue. IMPRESSION: 1. Prominent bilateral hydronephrosis and hydroureter with prominent pelvic floor laxity allowing the urinary bladder and UVJ region to herniate well below the level of the ischial tuberosities. I suspect the prominent cystocele may be causing some traction on the ureterovesical junctions leading to the bilateral obstruction. 2. There is substantial bilateral perirenal stranding along with scattered renal lesions of varying density which are probably cysts but technically nonspecific. 3. Third spacing of fluid with a small amount of ascites, mesenteric edema, small bilateral pleural effusions, as well as subcutaneous edema and presacral edema. 4.  Low position of the anorectal junction. Nonvisualization of the vagina related to pelvic floor laxity. 5. Low-density blood pool suggests anemia. 6. Airway thickening is present, suggesting bronchitis or reactive airways disease. 7.  Aortic Atherosclerosis (ICD10-I70.0). 8. Small supraumbilical hernia contains adipose tissue. 9. Chronically stable small left adrenal mass, benign. Electronically Signed   By: Van Clines M.D.   On: 05/17/2021 19:33   DG C-Arm 1-60 Min-No Report  Result Date: 05/18/2021 Fluoroscopy was utilized by the requesting physician.  No radiographic interpretation.        Scheduled Meds:  ALPRAZolam  1 mg Oral QHS   Chlorhexidine Gluconate Cloth  6 each Topical Daily   estradiol  1 mg Oral Daily   ipratropium  1 spray Each Nare Daily   liothyronine  25 mcg Oral Daily   loratadine  10 mg Oral Daily   mometasone-formoterol  2 puff Inhalation BID   montelukast  10 mg Oral QHS   pantoprazole  40 mg Oral Daily   Continuous Infusions:  meropenem (MERREM) IV 1 g (05/19/21 1240)     LOS: 3 days    Time spent: 34 minutes    Barb Merino, MD [facility] Pager [phone]

## 2021-05-19 NOTE — Progress Notes (Signed)
Pharmacy Antibiotic Note  Theresa Holloway is a 70 y.o. female admitted on 05/15/2021 with bacteremia and UTI.  Pharmacy has been consulted for meropenem dosing.  Was on Ancef for bacteremia/UTI, but urology changed to gentamicin as pt also had a positive UTI in office for ESBL. Pt got one time dose of gentamicin and was subsequently changed to meropenem as both the ESBL E.coli and the Klebsiella cultures are sensitive to meropenem  Plan: Meropenem 1 gr IV q12h  Monitor clinical course, renal function, cultures as available   Height: '5\' 6"'$  (167.6 cm) Weight: 56.9 kg (125 lb 7.1 oz) IBW/kg (Calculated) : 59.3  Temp (24hrs), Avg:98.2 F (36.8 C), Min:97.7 F (36.5 C), Max:99.5 F (37.5 C)  Recent Labs  Lab 05/15/21 1709 05/16/21 0451 05/16/21 0814 05/17/21 0456 05/18/21 0525 05/19/21 0509  WBC 25.5*  --  23.8* 21.7* 18.3* 30.2*  CREATININE 1.02* 1.46*  --  1.60* 2.11* 1.88*  LATICACIDVEN 1.1  --  1.2  --   --   --   GENTRANDOM  --   --   --   --   --  7.3    Estimated Creatinine Clearance: 25.7 mL/min (A) (by C-G formula based on SCr of 1.88 mg/dL (H)).    Allergies  Allergen Reactions   Codeine Anaphylaxis   Morphine And Related Anaphylaxis   Penicillins Anaphylaxis   Sulfa Antibiotics Anaphylaxis   Other     Cat gut suture - too long to heal    Prednisone     Excessive weight gain - 35lbs in 10 days   Skyrizi [Risankizumab] Nausea And Vomiting    Pt also got blisters when taking this med    Antimicrobials this admission:  8/1 cipro >> 8/2 8/2 Ancef >> 8/4 8/2 gentamicin >> 8/5 8/5 meropenem >>   Microbiology results:  PTA urine culture in offic also grew ESBL e.coli. See Dr Virgina Norfolk note from 8/4  Culture, Urine -  05/15/2021 at [facility] Notes: Performed By: [facility], [place], [place], [place], [address]; Lab Dir: Vedia Pereyra, [phone] >100,000 C/ML Lonell Grandchild NEGATIVE RODS; ID AND SENSITIVITY TO FOLLOW ROrganism 1 - N Notes: Performed By:  [facility], [address] [place], [place], [place], [address]; Lab Dir: Vedia Pereyra, [phone] . Antimicrobial Susceptibility and Organism Identification Report ource : URINE CULTURE Iso/Result : 01 Escherichia coli ESBL Antimicrobic/Dose MIC SYSTEMIC URINE --------------------------------------------------------------- Amp/Sulbactam 16/8 I I Amikacin <16 S S Ampicillin >16 R* R* Amox/K Clav <8/4 S S Aztreonam >16 ESBL ESBL Ceftriaxone >32 ESBL ESBL Ceftazidime >16 ESBL ESBL Cefotaxime >32 ESBL ESBL Cefazolin >16 R* R* Ciprofloxacin >2 R R Cefepime >16 R* R* Cefotetan <16 S S Ertapenem <0.5 S S Nitrofurantoin <32 S Gentamicin <4 S S Levofloxacin >4 R R Meropenem <1 S S Pip/Tazo <16 S S Trimeth/Sulfa <2/38 S S Tetracycline <4 S S Tobramycin <4 S S    8/1 BCx: 1/2 Kleb pneumo - no res  8/1 UCx: Kleb pneumo: (R to amp, I to Macrobid ) 7/28 UCx: 80,000 colonies/mL Klebsiella pneumoniae,   8/3 BCx: NGTD     Royetta Asal, PharmD, BCPS 05/19/2021 9:55 AM

## 2021-05-19 NOTE — Progress Notes (Addendum)
Pt c/o 10/10 abdominal pain and nausea not relieved by PRN medications. Pt is dry heaving. Pt has not eaten anything since her procedure, only drank some water and coke. Clarene Essex NP notified. Pain medication dosage increased and ordered to monitor the nausea. Pt states pain is a little better and that she has had no more dry heaving or nausea throughout the night. Pt has remained NPO since midnight. Pts WBC spiked to 30.2 this morning. Clarene [place] NP notified. No new orders given at this time.

## 2021-05-19 NOTE — Consult Note (Addendum)
Chief Complaint: Patient was seen in consultation today for left percutaneous nephrostomy  Referring Physician(s): Gay,M  Supervising Physician: Markus Daft  Patient Status: [facility] - In-pt  History of Present Illness: Theresa Holloway is a 70 y.o. female with past medical history significant for COPD, asthma, GERD, chronic kidney disease, anxiety, prior partial nephrectomy for left-sided renal cancer 2009 who presented to [facility] on 8/1 with fever, dysuria and hematuria.  She has had a history of pelvic organ prolapse for approximately 1 year with associated urinary urgency and urge incontinence and is scheduled to undergo robotic assisted laparoscopic sacrocolpopexy on 05/24/2021.  She was seen in the urology office on 8/1 for urinary tract infection along with fever and concern for sepsis and was subsequently directed to the ED.  Urine culture grew Klebsiella. She was  also found to have Klebsiella and Enterobacter in blood culture. Subsequent CT of the abdomen pelvis revealed:  1. Prominent bilateral hydronephrosis and hydroureter with prominent pelvic floor laxity allowing the urinary bladder and UVJ region to herniate well below the level of the ischial tuberosities. I suspect the prominent cystocele may be causing some traction on the ureterovesical junctions leading to the bilateral obstruction. 2. There is substantial bilateral perirenal stranding along with scattered renal lesions of varying density which are probably cysts but technically nonspecific. 3. Third spacing of fluid with a small amount of ascites, mesenteric edema, small bilateral pleural effusions, as well as subcutaneous edema and presacral edema. 4. Low position of the anorectal junction. Nonvisualization of the vagina related to pelvic floor laxity. 5. Low-density blood pool suggests anemia. 6. Airway thickening is present, suggesting bronchitis or reactive airways disease. 7.  Aortic Atherosclerosis  (ICD10-I70.0). 8. Small supraumbilical hernia contains adipose tissue. 9. Chronically stable small left adrenal mass, benign   Creatinine on admission was 1.02; on 8/4 patient underwent cystoscopy with bilateral retrograde pyelogram, right ureteral stent placement and attempted but unsuccessful left ureteral stent placement due to tortuous ureter with some extravasation of contrast after attempting to bypass the tortuosity.  She is currently afebrile, current labs include creatinine 1.88, WBC 30.2 from 18.3, hemoglobin 10.1, platelets 700k, PT/INR normal, COVID-19 negative.  Request now received from urology for left percutaneous nephrostomy with eventual reattempt at ureteral stent placement. Past Medical History:  Diagnosis Date   Anginal pain ([place]) 03/22/2017   due to stress   Asthma    Chronic kidney disease [id number]   cancer   Complication of anesthesia    COPD (chronic obstructive pulmonary disease) (HCC)    mild   FHx: gallbladder disease    GERD (gastroesophageal reflux disease)    H/O: hysterectomy    Headache(784.0) 01/29/2013   History of back surgery    History of kidney cancer    Hypothyroidism    Insomnia 01/29/2013   Osteoporosis    Pneumonia 2019   PONV (postoperative nausea and vomiting)    Postconcussion syndrome 01/29/2013   Right ankle injury    Sinus tarsi syndrome    Squamous cell carcinoma of skin 02/23/2019   in situ on left forearm, lower - tx p bx   Squamous cell carcinoma of skin 02/23/2019   in situ on left thigh, mid - tx p bx   Squamous cell carcinoma of skin 02/23/2019   in situ on left lower, inner shin - tx p bx    Past Surgical History:  Procedure Laterality Date   [place]  2010  BREAST BIOPSY Left 2004   negative   CHOLECYSTECTOMY  1996   CYSTOSCOPY WITH STENT PLACEMENT Bilateral 05/18/2021   Procedure: CYSTOSCOPY, BILATERAL RETROGRADE PYELOGRAM,  RIGHT STENT PLACEMENT, ATTEMPTED LEFT STENT PLACEMENT;   Surgeon: Janith Lima, MD;  Location: [facility];  Service: Urology;  Laterality: Bilateral;   ELBOW SURGERY Right 1997   left foot surgery  2012   NEPHRECTOMY  2009   right foot surgery  2015   right knee surgery  1979   right ring finger surgery  2002   STOMACH SURGERY  2003    Allergies: Codeine, Morphine and related, Penicillins, Sulfa antibiotics, Other, Prednisone, and Skyrizi [risankizumab]  Medications: Prior to Admission medications   Medication Sig Start Date End Date Taking? Authorizing Provider  acetaminophen (TYLENOL) 500 MG tablet Take 1,000 mg by mouth every 6 (six) hours as needed for moderate pain.   Yes [provider]  acyclovir (ZOVIRAX) 800 MG tablet Take 800 mg by mouth 5 (five) times daily as needed (shingles). 04/11/21  Yes [provider]  ALPRAZolam Duanne Moron) 1 MG tablet Take 1 mg by mouth at bedtime.  01/20/15  Yes [provider]  azithromycin (ZITHROMAX) 250 MG tablet Take 250 mg by mouth See admin instructions. Zpack 05/13/21  Yes [provider]  cetirizine (ZYRTEC) 10 MG tablet Take 10 mg by mouth daily.   Yes [provider]  estradiol (ESTRACE) 1 MG tablet Take 1 tablet (1 mg total) by mouth daily. 04/06/21  Yes Estill Dooms, NP  fluticasone (FLONASE) 50 MCG/ACT nasal spray Place 1 spray into both nostrils in the morning and at bedtime. 03/06/21  Yes [provider]  ipratropium (ATROVENT) 0.03 % nasal spray Place 1 spray into both nostrils daily. 05/11/21  Yes [provider]  liothyronine (CYTOMEL) 25 MCG tablet Take 25 mcg by mouth daily. 01/08/21  Yes [provider]  meclizine (ANTIVERT) 25 MG tablet Take 25 mg by mouth 3 (three) times daily as needed for dizziness.   Yes [provider]  montelukast (SINGULAIR) 10 MG tablet Take 10 mg by mouth at bedtime. 02/07/21  Yes [provider]  OTEZLA 30 MG TABS TAKE 1 TABLET BY MOUTH 2 TIMES A DAY. Patient taking  differently: Take 30 mg by mouth in the morning and at bedtime. 01/24/21  Yes Lavonna Monarch, MD  pantoprazole (PROTONIX) 40 MG tablet Take 40 mg by mouth daily.   Yes [provider]  Probiotic Product (TRUBIOTICS) CAPS Take 1 capsule by mouth daily.   Yes [provider]  RESTASIS 0.05 % ophthalmic emulsion Place 1 drop into both eyes 2 (two) times daily. 11/26/20  Yes [provider]  SYMBICORT 160-4.5 MCG/ACT inhaler Inhale 2 puffs into the lungs 2 (two) times daily as needed (wheezing). 01/27/21  Yes [provider]  Apremilast (OTEZLA) 10 & 20 & 30 MG TBPK Take as directed per package instructions. Patient not taking: No sig reported 06/08/20   Lavonna Monarch, MD     Family History  Problem Relation Age of Onset   Heart failure Mother    COPD Mother    Heart attack Father    Breast cancer Neg Hx     Social History   Socioeconomic History   Marital status: Single    Spouse name: Not on file   Number of children: 0   Years of education: Not on file   Highest education level: Not on file  Occupational History   Not on  file  Tobacco Use   Smoking status: Never   Smokeless tobacco: Never  Vaping Use   Vaping Use: Never used  Substance and Sexual Activity   Alcohol use: No   Drug use: No   Sexual activity: Not Currently    Birth control/protection: Surgical    Comment: hyst  Other Topics Concern   Not on file  Social History Narrative   Not on file   Social Determinants of Health   Financial Resource Strain: Low Risk    Difficulty of Paying Living Expenses: Not hard at all  Food Insecurity: No Food Insecurity   Worried About Charity fundraiser in the Last Year: Never true   Cook in the Last Year: Never true  Transportation Needs: No Transportation Needs   Lack of Transportation (Medical): No   Lack of Transportation (Non-Medical): No  Physical Activity: Inactive   Days of Exercise per Week: 0 days   Minutes of Exercise  per Session: 0 min  Stress: No Stress Concern Present   Feeling of Stress : Not at all  Social Connections: Socially Isolated   Frequency of Communication with Friends and Family: More than three times a week   Frequency of Social Gatherings with Friends and Family: Never   Attends Religious Services: Never   Marine scientist or Organizations: No   Attends Music therapist: Never   Marital Status: Divorced      Review of Systems currently denies fever, headache, chest pain, dyspnea, cough, nausea, vomiting or bleeding.  She continues to have abdominal pain/distention with some radiation to both right and left lateral abdominal regions.  Vital Signs: BP 121/61 (BP Location: Left Arm)   Pulse 89   Temp 97.7 F (36.5 C) (Oral)   Resp 16   Ht '5\' 6"'$  (1.676 m)   Wt 125 lb 7.1 oz (56.9 kg)   SpO2 93%   BMI 20.25 kg/m   Physical Exam awake, alert.  Chest with slightly diminished breath sounds bases.  Heart with regular rate and rhythm.  Abdomen  distended, few bowel sounds, diffusely tender to palpation.  No lower extremity edema.  Yellow urine in Foley catheter  Imaging: CT ABDOMEN PELVIS WO CONTRAST  Result Date: 05/17/2021 CLINICAL DATA:  Abdominal pain, gram-negative bacteremia. EXAM: CT ABDOMEN AND PELVIS WITHOUT CONTRAST TECHNIQUE: Multidetector CT imaging of the abdomen and pelvis was performed following the standard protocol without IV contrast. COMPARISON:  09/09/2012 CT abdomen FINDINGS: Lower chest: Descending thoracic aortic atherosclerotic calcification. Low-density blood pool suggesting anemia. Small bilateral pleural effusions. Peripheral atelectasis in the right lower lobe. Airway thickening is present, suggesting bronchitis or reactive airways disease. Calcified granuloma in the lingula (benign). Hepatobiliary: Cholecystectomy.  Otherwise unremarkable. Pancreas: Unremarkable Spleen: Unremarkable Adrenals/Urinary Tract: 1.0 cm nodule of the lateral limb left  adrenal gland is unchanged from 09/09/2012 hence likely benign, but with indeterminate density. Substantial bilateral perirenal stranding along with moderate to prominent bilateral hydronephrosis and hydroureter. There is pelvic floor laxity with a cystocele and with the distal ureters extending down well below the pubococcygeal line in presumed herniated bladder. This extends down beyond the inferior margin of today's examination. Both kidneys appear expanded and with multiple cystic lesions. Some of the lesions in the kidneys are hyperdense likely reflecting complex cysts although technically nonspecific. Stable findings of partial nephrectomy along the right kidney lower pole. Stomach/Bowel: Low position of the anorectal junction due to pelvic floor laxity. Vascular/Lymphatic: Aortoiliac atherosclerotic vascular disease. Reproductive:  Hysterectomy. Nonvisualization of the vagina probably attributable to the severe pelvic floor laxity. Other: Small amount of free pelvic fluid in the paracolic gutters. Presacral edema. Subcutaneous edema. Low-grade central mesenteric edema. Musculoskeletal: Posterolateral rod and pedicle screw fixation bilaterally at L3-4. Solid interbody fusion at L4-5. Posterior decompression from L3 through L5. Small supraumbilical hernia containing adipose tissue. IMPRESSION: 1. Prominent bilateral hydronephrosis and hydroureter with prominent pelvic floor laxity allowing the urinary bladder and UVJ region to herniate well below the level of the ischial tuberosities. I suspect the prominent cystocele may be causing some traction on the ureterovesical junctions leading to the bilateral obstruction. 2. There is substantial bilateral perirenal stranding along with scattered renal lesions of varying density which are probably cysts but technically nonspecific. 3. Third spacing of fluid with a small amount of ascites, mesenteric edema, small bilateral pleural effusions, as well as subcutaneous edema  and presacral edema. 4. Low position of the anorectal junction. Nonvisualization of the vagina related to pelvic floor laxity. 5. Low-density blood pool suggests anemia. 6. Airway thickening is present, suggesting bronchitis or reactive airways disease. 7.  Aortic Atherosclerosis (ICD10-I70.0). 8. Small supraumbilical hernia contains adipose tissue. 9. Chronically stable small left adrenal mass, benign. Electronically Signed   By: Van Clines M.D.   On: 05/17/2021 19:33   DG Abd 1 View  Result Date: 05/15/2021 CLINICAL DATA:  Abdominal pain. EXAM: ABDOMEN - 1 VIEW COMPARISON:  None. FINDINGS: Minimal gaseous gastric distension. There is no gaseous small bowel distension or evidence of obstruction. Small volume of colonic stool. Right upper quadrant surgical clips typical of cholecystectomy. There is a calcification in the left pelvis, typically phleboliths. Lumbar fusion hardware. No acute osseous abnormalities are seen on the current exam. IMPRESSION: Minimal gaseous gastric distension. No evidence of bowel obstruction. Electronically Signed   By: Keith Rake M.D.   On: 05/15/2021 20:47   US RENAL  Result Date: 05/16/2021 CLINICAL DATA:  Acute renal injury. History of partial right nephrectomy. EXAM: RENAL / URINARY TRACT ULTRASOUND COMPLETE COMPARISON:  CT chest 09/10/2018.  MRI abdomen 10/12/2013. FINDINGS: Right Kidney: Renal measurements: 12.3 x 5.5 x 5.9 cm . Mild increased echogenicity cannot be excluded. Mild hydronephrosis cannot be excluded. 2.3 cm complex cyst upper portion right kidney. 4.2 cm simple cyst midportion right kidney. 0.8 cm punctate hyperechoic focus mid right kidney, most likely an angiomyolipoma. Left Kidney: Renal measurements: 14.5 x 6.8 x 6.0 cm. Mild increased echogenicity cannot be excluded. Mild hydronephrosis cannot be excluded. 1.6 cm hyperechoic focus superior pole left kidney, although this could represent an angiomyolipoma, renal malignancy cannot be excluded.  3.1 cm hypoechoic mass left upper renal pole, renal malignancy cannot be excluded. 3.6 cm complex cyst with thick septations left lower renal pole, renal malignancy cannot be excluded. Bladder: Bladder is nondistended.  Patient has a Foley catheter in. Other: Trace amount of free fluid noted adjacent to the right kidney. IMPRESSION: 1. Mild increased echogenicity both kidneys cannot be excluded. Chronic medical renal disease cannot be excluded. Trace amount of free fluid noted adjacent to the right kidney. 2. Mild bilateral hydronephrosis cannot be excluded. No bladder distention. Foley catheter in bladder. 3. Mass lesions within both kidneys as described above. Renal MRI is suggested as a renal malignancy cannot be excluded. Electronically Signed   By: Marcello Moores  Register   On: 05/16/2021 13:08   DG CHEST PORT 1 VIEW  Result Date: 05/17/2021 CLINICAL DATA:  History of pneumonia. EXAM: PORTABLE CHEST 1 VIEW COMPARISON:  Chest x-ray  10/2020. FINDINGS: Mediastinum and hilar structures normal. Heart size normal. Mild right infrahilar/right base subsegmental atelectasis. No pleural effusion or pneumothorax. No acute bony abnormality. IMPRESSION: Mild right infrahilar/right base subsegmental atelectasis. Electronically Signed   By: Marcello Moores  Register   On: 05/17/2021 11:45   DG Chest Port 1 View  Result Date: 05/15/2021 CLINICAL DATA:  Questionable sepsis - evaluate for abnormality EXAM: PORTABLE CHEST 1 VIEW COMPARISON:  08/11/2018 FINDINGS: The cardiomediastinal contours are normal. Minimal apical emphysema. Pulmonary vasculature is normal. Subsegmental atelectasis at the left lung base. No confluent consolidation. No pleural fluid or pneumothorax. No acute osseous abnormalities are seen. IMPRESSION: 1. Subsegmental atelectasis at the left lung base. 2. Minimal apical emphysema. Electronically Signed   By: Keith Rake M.D.   On: 05/15/2021 17:00   DG C-Arm 1-60 Min-No Report  Result Date:  05/18/2021 Fluoroscopy was utilized by the requesting physician.  No radiographic interpretation.    Labs:  CBC: Recent Labs    05/16/21 0814 05/17/21 0456 05/18/21 0525 05/19/21 0509  WBC 23.8* 21.7* 18.3* 30.2*  HGB 9.6* 8.6* 8.4* 10.1*  HCT 30.5* 27.0* 26.1* 31.4*  PLT 557* 520* 512* 700*    COAGS: Recent Labs    05/15/21 1709 05/18/21 1945  INR 1.0 1.2  APTT 44*  --     BMP: Recent Labs    05/16/21 0451 05/17/21 0456 05/18/21 0525 05/19/21 0509  NA 131* 132* 131* 138  K 3.0* 3.5 3.5 3.9  CL 104 103 106 110  CO2 18* 19* 18* 16*  GLUCOSE 89 115* 109* 134*  BUN '17 17 17 19  '$ CALCIUM 7.2* 7.7* 7.5* 8.5*  CREATININE 1.46* 1.60* 2.11* 1.88*  GFRNONAA 39* 35* 25* 29*    LIVER FUNCTION TESTS: Recent Labs    05/11/21 1010 05/15/21 1709  BILITOT 0.4 0.6  AST 14* 16  ALT 14 17  ALKPHOS 79 106  PROT 6.8 7.1  ALBUMIN 3.0* 2.9*    TUMOR MARKERS: No results for input(s): AFPTM, CEA, CA199, CHROMGRNA in the last 8760 hours.  Assessment and Plan: 70 y.o. female with past medical history significant for COPD, asthma, GERD, chronic kidney disease, anxiety, prior partial nephrectomy for left-sided renal cancer 2009 who presented to [facility] on 8/1 with fever, dysuria and hematuria.  She has had a history of pelvic organ prolapse for approximately 1 year with associated urinary urgency and urge incontinence and is scheduled to undergo robotic assisted laparoscopic sacrocolpopexy on 05/24/2021.  She was seen in the urology office on 8/1 for urinary tract infection along with fever and concern for sepsis and was subsequently directed to the ED.  Urine culture grew Klebsiella. She was  also found to have Klebsiella and Enterobacter in blood culture. Subsequent CT of the abdomen pelvis revealed:  1. Prominent bilateral hydronephrosis and hydroureter with prominent pelvic floor laxity allowing the urinary bladder and UVJ region to herniate well below the level of  the ischial tuberosities. I suspect the prominent cystocele may be causing some traction on the ureterovesical junctions leading to the bilateral obstruction. 2. There is substantial bilateral perirenal stranding along with scattered renal lesions of varying density which are probably cysts but technically nonspecific. 3. Third spacing of fluid with a small amount of ascites, mesenteric edema, small bilateral pleural effusions, as well as subcutaneous edema and presacral edema. 4. Low position of the anorectal junction. Nonvisualization of the vagina related to pelvic floor laxity. 5. Low-density blood pool suggests anemia. 6. Airway thickening is present,  suggesting bronchitis or reactive airways disease. 7.  Aortic Atherosclerosis (ICD10-I70.0). 8. Small supraumbilical hernia contains adipose tissue. 9. Chronically stable small left adrenal mass, benign   Creatinine on admission was 1.02; on 8/4 patient underwent cystoscopy with bilateral retrograde pyelogram, right ureteral stent placement and attempted but unsuccessful left ureteral stent placement due to tortuous ureter with some extravasation of contrast after attempting to bypass the tortuosity.  She is currently afebrile, current labs include creatinine 1.88, WBC 30.2 from 18.3, hemoglobin 10.1, platelets 700k, PT/INR normal, COVID-19 negative.  Request now received from urology for left percutaneous nephrostomy with eventual reattempt at ureteral stent placement.  Imaging studies have been reviewed by Dr.Henn. Risks and benefits of left PCN placement was discussed with the patient including, but not limited to, infection, bleeding, significant bleeding causing loss or decrease in renal function or damage to adjacent structures.   All of the patient's questions were answered, patient is agreeable to proceed.  Consent signed and in chart.  She is on IV meropenem.  Procedure scheduled for today    Thank you for this interesting  consult.  I greatly enjoyed meeting Jonah J Grenier and look forward to participating in their care.  A copy of this report was sent to the requesting provider on this date.  Electronically Signed: D. Rowe Robert, PA-C 05/19/2021, 9:42 AM   I spent a total of  30 minutes   in face to face in clinical consultation, greater than 50% of which was counseling/coordinating care for left percutaneous nephrostomy

## 2021-05-20 DIAGNOSIS — A419 Sepsis, unspecified organism: Secondary | ICD-10-CM | POA: Diagnosis not present

## 2021-05-20 DIAGNOSIS — N390 Urinary tract infection, site not specified: Secondary | ICD-10-CM | POA: Diagnosis not present

## 2021-05-20 LAB — CBC WITH DIFFERENTIAL/PLATELET
Abs Immature Granulocytes: 0.5 10*3/uL — ABNORMAL HIGH (ref 0.00–0.07)
Basophils Absolute: 0 10*3/uL (ref 0.0–0.1)
Basophils Relative: 0 %
Eosinophils Absolute: 0.1 10*3/uL (ref 0.0–0.5)
Eosinophils Relative: 1 %
HCT: 30.4 % — ABNORMAL LOW (ref 36.0–46.0)
Hemoglobin: 9.6 g/dL — ABNORMAL LOW (ref 12.0–15.0)
Immature Granulocytes: 3 %
Lymphocytes Relative: 11 %
Lymphs Abs: 2.1 10*3/uL (ref 0.7–4.0)
MCH: 27.6 pg (ref 26.0–34.0)
MCHC: 31.6 g/dL (ref 30.0–36.0)
MCV: 87.4 fL (ref 80.0–100.0)
Monocytes Absolute: 1 10*3/uL (ref 0.1–1.0)
Monocytes Relative: 5 %
Neutro Abs: 15.4 10*3/uL — ABNORMAL HIGH (ref 1.7–7.7)
Neutrophils Relative %: 80 %
Platelets: 758 10*3/uL — ABNORMAL HIGH (ref 150–400)
RBC: 3.48 MIL/uL — ABNORMAL LOW (ref 3.87–5.11)
RDW: 15.1 % (ref 11.5–15.5)
WBC: 19.1 10*3/uL — ABNORMAL HIGH (ref 4.0–10.5)
nRBC: 0 % (ref 0.0–0.2)

## 2021-05-20 LAB — BASIC METABOLIC PANEL
Anion gap: 8 (ref 5–15)
BUN: 16 mg/dL (ref 8–23)
CO2: 21 mmol/L — ABNORMAL LOW (ref 22–32)
Calcium: 8.1 mg/dL — ABNORMAL LOW (ref 8.9–10.3)
Chloride: 106 mmol/L (ref 98–111)
Creatinine, Ser: 1.21 mg/dL — ABNORMAL HIGH (ref 0.44–1.00)
GFR, Estimated: 49 mL/min — ABNORMAL LOW (ref >60)
Glucose, Bld: 96 mg/dL (ref 70–99)
Potassium: 3 mmol/L — ABNORMAL LOW (ref 3.5–5.1)
Sodium: 135 mmol/L (ref 135–145)

## 2021-05-20 MED ORDER — POLYETHYLENE GLYCOL 3350 17 G PO PACK
17.0000 g | PACK | Freq: Every day | ORAL | Status: DC
  Administered 2021-05-20 – 2021-05-21 (×2): 17 g via ORAL
  Filled 2021-05-20 (×4): qty 1

## 2021-05-20 MED ORDER — POTASSIUM CHLORIDE CRYS ER 10 MEQ PO TBCR
40.0000 meq | EXTENDED_RELEASE_TABLET | Freq: Two times a day (BID) | ORAL | Status: AC
  Administered 2021-05-20 – 2021-05-21 (×3): 40 meq via ORAL
  Filled 2021-05-20 (×2): qty 4

## 2021-05-20 MED ORDER — POTASSIUM CHLORIDE CRYS ER 10 MEQ PO TBCR
40.0000 meq | EXTENDED_RELEASE_TABLET | Freq: Once | ORAL | Status: AC
  Administered 2021-05-20: 40 meq via ORAL
  Filled 2021-05-20: qty 4

## 2021-05-20 NOTE — Progress Notes (Signed)
[place] set up and given to patient.

## 2021-05-20 NOTE — Progress Notes (Signed)
2 Days Post-Op Subjective: Pain controlled.  No nausea or emesis.  Afebrile. Creatinine improved to 1.2.  She has mild left flank pain  Objective: Vital signs in last 24 hours: Temp:  [97.7 F (36.5 C)-98.8 F (37.1 C)] 98.8 F (37.1 C) (08/06 1336) Pulse Rate:  [86-97] 88 (08/06 1336) Resp:  [16-20] 18 (08/06 1336) BP: (104-130)/(44-93) 126/51 (08/06 1336) SpO2:  [93 %-100 %] 96 % (08/06 1336)  Intake/Output from previous day: 08/05 0701 - 08/06 0700 In: 200 [IV Piggyback:200] Out: 1750 [Urine:1750] Intake/Output this shift: Total I/O In: 28 [P.O.:236] Out: 1325 [Urine:1325]  UOP: 2.6L clear  Physical Exam:  General: Alert and oriented CV: RRR Lungs: Clear Abdomen: Soft, ND, NT Ext: NT, No erythema GU: Cystocele present and bladder prolapsed. With a chaperone present, I was able to reduce her bladder back into her vaginal vault. She tolerated well.  Lab Results: Recent Labs    05/18/21 0525 05/19/21 0509 05/20/21 0557  HGB 8.4* 10.1* 9.6*  HCT 26.1* 31.4* 30.4*   BMET Recent Labs    05/19/21 0509 05/20/21 0557  NA 138 135  K 3.9 3.0*  CL 110 106  CO2 16* 21*  GLUCOSE 134* 96  BUN 19 16  CREATININE 1.88* 1.21*  CALCIUM 8.5* 8.1*     Studies/Results: DG C-Arm 1-60 Min-No Report  Result Date: 05/18/2021 Fluoroscopy was utilized by the requesting physician.  No radiographic interpretation.   IR NEPHROSTOMY PLACEMENT LEFT  Result Date: 05/19/2021 INDICATION: 70 year old with bilateral hydronephrosis and cystocele. Retrograde right ureter stent was successfully placed on 05/18/2021. Unable to place left ureter stent. Patient presents for left percutaneous nephrostomy tube placement. EXAM: PLACEMENT OF LEFT PERCUTANEOUS NEPHROSTOMY TUBE USING ULTRASOUND AND FLUOROSCOPIC GUIDANCE COMPARISON:  CT 05/17/2021 MEDICATIONS: Inpatient receiving IV antibiotics. ANESTHESIA/SEDATION: Fentanyl 100 mcg IV; Versed 4.0 mg IV, Zofran 4 mg Moderate Sedation Time:  40  minutes The patient was continuously monitored during the procedure by the interventional radiology nurse under my direct supervision. CONTRAST:  20 mL Omnipaque 300-administered into the collecting system(s) FLUOROSCOPY TIME:  Fluoroscopy Time: 3 minutes 48 seconds (40 mGy). COMPLICATIONS: None immediate. PROCEDURE: Informed written consent was obtained from the patient after a thorough discussion of the procedural risks, benefits and alternatives. All questions were addressed. A timeout was performed prior to the initiation of the procedure. Patient was placed prone. The left flank was prepped and draped in sterile fashion. Maximal barrier sterile technique was utilized including caps, mask, sterile gowns, sterile gloves, sterile drape, hand hygiene and skin antiseptic. Ultrasound was used to identify the left kidney. Left flank was anesthetized using 1% lidocaine. Small incision was made. Using ultrasound guidance, a 21 gauge needle was directed into a mildly dilated mid pole calyx. Contrast injection confirmed placement in the calyx and renal collecting system. A 0.018 wire was successfully advanced into the renal pelvis. Transitional dilator set was placed. Super stiff Amplatz wire was advanced into the renal pelvis and a 10 [place] multipurpose drain was advanced over the wire and reconstituted in the renal pelvis. Follow-up contrast injection was performed. Catheter was flushed with saline and attached to a gravity bag. Catheter was sutured to skin with Prolene suture. Fluoroscopic and ultrasound images were taken and saved for documentation. FINDINGS: Mild to moderate left hydronephrosis. Percutaneous access was obtained in a mid pole calyx. Initial contrast injection demonstrated filling of the renal collecting system and immediate extravasation outside of the collecting system. Findings are similar to the recent retrograde images from 05/18/2021. Catheter  placed in the renal pelvis and the left renal  collecting system was decompressed at the end of procedure. IMPRESSION: 1. Successful placement of left percutaneous nephrostomy tube using ultrasound and fluoroscopic guidance. 2. Persistent contrast extravasation from the left renal collecting system. Findings similar to the retrograde images from 05/18/2021. Electronically Signed   By: Markus Daft M.D.   On: 05/19/2021 17:32    Assessment/Plan: #1.  Cystocele: She has a planned robotic assisted laparoscopic sacrocolpopexy with Dr. Louis Meckel on 05/24/2021.  Given her UTI with bacteremia, this case may be delayed.     2.  Bilateral hydroureteronephrosis: Seen on CT A/P 05/17/2021 to the bilateral UVJ likely caused by her cystocele.  S/p cystoscopy, right ureteral stent placement, attempted left ureteral stent placement.  Left nephrostomy tube place by Interventional radiology.    3.  Acute kidney injury: Her creatinine on 05/18/2021 is 2.1 elevated from baseline of 1. Creatinine improved to 1.2     4. UTI: Ucx 05/15/2021 with ESBL E. coli in the office and Klebsiella in the hospital.  She was on ancef at time of consultation however, I note that her Ucx 8/1 with ESBL E coli was resistence to ancef.  She was switched to gentamicin and is doing well  Urology to continue to follow     LOS: 4 days   Matt R. Gay MD 05/20/2021, 4:09 PM [facility]  Pager: [phone]

## 2021-05-20 NOTE — Progress Notes (Signed)
PT Cancellation Note  Patient Details Name: Theresa Holloway MRN: [id number] DOB: [birth year]   Cancelled Treatment:    Reason Eval/Treat Not Completed: Patient declined, no reason specified   [facility] 05/20/2021, 2:57 PM

## 2021-05-20 NOTE — Progress Notes (Signed)
PROGRESS NOTE    Theresa Holloway  [id number] DOB: 07-14-1951 DOA: 05/15/2021 PCP: Sharilyn Sites, MD    Brief Narrative:  70 year old female with history of COPD, asthma, anxiety, history of partial nephrectomy for left-sided renal cancer presented to the emergency room with fever, dysuria and hematuria.  Patient has been suffering from bladder prolapse and followed up with urology with planned sling surgery on 8/10.  Patient had frequency and dysuria and was given Foley catheter on urology visit on 8/1.  Urine culture taken on 7/28 for preop eval was positive for ESBL E. coli.  Blood cultures on 8/1 positive for Klebsiella.  Urine culture also with Klebsiella on repeat exam. Urine culture from office on 7/28 positive for ESBL E. coli. With persistent elevated white count found to have bilateral severe hydronephrosis. Cystoscopy on 8/4, stenting right ureter.  Left ureter unable to stent.  IR DID PERCUTANEOUS NEPHROSTOMY ON THE LEFT SIDE 8/5.    Assessment & Plan:   Principal Problem:   Sepsis secondary to UTI [facility]) Active Problems:   Sepsis ([place])   Hypokalemia   AKI (acute kidney injury) ([place])   Bladder prolapse, female, acquired  Sepsis secondary to Klebsiella bacteremia/Klebsiella UTI: Recent ESBL E. coli. Treated with different antibiotics.  Urine culture sensitivity available from the office with ESBL. Currently on meropenem. Severe bilateral hydronephrosis with prolapsed bladder Status post cystoscopy, right-sided stenting of the ureter.  Unable to stent left side. IR did percutaneous nephrostomy 8/5.  May need internalization of the stent.  Followed by urology. Patient has prolapsed bladder and is planned for sling procedure, hopefully after improvement of acute infection.  Acute kidney injury: Due to #1.  Continue IV fluid resuscitation.  Some improvement of renal functions today.  Continue to monitor. Continue Foley catheter care and nephrostomy  care.  Hypokalemia/hypomagnesemia: Replace further.  Hypothyroidism: Euthyroid on replacement.  Continue.  Start mobilizing.  Consult PT.   DVT prophylaxis: Place and maintain sequential compression device Start: 05/18/21 1146 SCD   Code Status: Full code Family Communication: Friend at the bedside. Disposition Plan: Status is: Inpatient  Remains inpatient appropriate because:Inpatient level of care appropriate due to severity of illness  Dispo: The patient is from: Home              Anticipated d/c is to: Home              Patient currently is not medically stable to d/c.   Difficult to place patient No         Consultants:  Urology Interventional radiology  Procedures:  None  Antimicrobials:  Ancef 8/1--- 8/4 Gentamicin 8/4 Meropenem 8/5----   Subjective: Patient seen and examined.  Abdominal pain is better.  Friend at the bedside.  Less distended.  No bowel movement since last week. Minimal discomfort at the drain site. WBC count trending down.  Objective: Vitals:   05/19/21 1656 05/19/21 2006 05/19/21 2045 05/20/21 0447  BP: 104/85  (!) 113/44 (!) 120/55  Pulse: 86  93 96  Resp: '19  16 16  '$ Temp: 97.7 F (36.5 C)  97.8 F (36.6 C) 98.6 F (37 C)  TempSrc: Oral  Oral Oral  SpO2: 99% 96% 98% 93%  Weight:      Height:        Intake/Output Summary (Last 24 hours) at 05/20/2021 1213 Last data filed at 05/20/2021 0600 Gross per 24 hour  Intake 200 ml  Output 1750 ml  Net -1550 ml   [employer]  05/15/21 1617 05/18/21 1544  Weight: 56.9 kg 56.9 kg    Examination:  General: In mild distress and anxious.  On room air and comfortable on conversation. Cardiovascular: S1-S2 normal.  No added sounds. Respiratory: Bilateral clear.  No added sounds. Gastrointestinal: Soft.  Nontender. Foley catheter with clear urine. Left nephrostomy with clear urine. Ext: No swelling or edema.  No cyanosis. Neuro: Grossly normal. Musculoskeletal: No  deformities.      Data Reviewed: I have personally reviewed following labs and imaging studies  CBC: Recent Labs  Lab 05/15/21 1709 05/16/21 0814 05/17/21 0456 05/18/21 0525 05/19/21 0509 05/20/21 0557  WBC 25.5* 23.8* 21.7* 18.3* 30.2* 19.1*  NEUTROABS 22.3*  --   --  15.9* 27.4* 15.4*  HGB 10.0* 9.6* 8.6* 8.4* 10.1* 9.6*  HCT 31.0* 30.5* 27.0* 26.1* 31.4* 30.4*  MCV 87.8 89.2 89.4 89.1 87.7 87.4  PLT 615* 557* 520* 512* 700* 123456*   Basic Metabolic Panel: Recent Labs  Lab 05/16/21 0451 05/17/21 0456 05/18/21 0525 05/19/21 0509 05/20/21 0557  NA 131* 132* 131* 138 135  K 3.0* 3.5 3.5 3.9 3.0*  CL 104 103 106 110 106  CO2 18* 19* 18* 16* 21*  GLUCOSE 89 115* 109* 134* 96  BUN '17 17 17 19 16  '$ CREATININE 1.46* 1.60* 2.11* 1.88* 1.21*  CALCIUM 7.2* 7.7* 7.5* 8.5* 8.1*  MG  --  1.5* 1.9 1.9  --   PHOS  --   --  2.9 3.8  --    GFR: Estimated Creatinine Clearance: 40 mL/min (A) (by C-G formula based on SCr of 1.21 mg/dL (H)). Liver Function Tests: Recent Labs  Lab 05/15/21 1709  AST 16  ALT 17  ALKPHOS 106  BILITOT 0.6  PROT 7.1  ALBUMIN 2.9*   No results for input(s): LIPASE, AMYLASE in the last 168 hours. No results for input(s): AMMONIA in the last 168 hours. Coagulation Profile: Recent Labs  Lab 05/15/21 1709 05/18/21 1945  INR 1.0 1.2   Cardiac Enzymes: No results for input(s): CKTOTAL, CKMB, CKMBINDEX, TROPONINI in the last 168 hours. BNP (last 3 results) No results for input(s): PROBNP in the last 8760 hours. HbA1C: No results for input(s): HGBA1C in the last 72 hours. CBG: No results for input(s): GLUCAP in the last 168 hours. Lipid Profile: No results for input(s): CHOL, HDL, LDLCALC, TRIG, CHOLHDL, LDLDIRECT in the last 72 hours. Thyroid Function Tests: No results for input(s): TSH, T4TOTAL, FREET4, T3FREE, THYROIDAB in the last 72 hours. Anemia Panel: No results for input(s): VITAMINB12, FOLATE, FERRITIN, TIBC, IRON, RETICCTPCT in the  last 72 hours. Sepsis Labs: Recent Labs  Lab 05/15/21 1709 05/16/21 0814  LATICACIDVEN 1.1 1.2    Recent Results (from the past 240 hour(s))  Urine culture     Status: Abnormal   Collection Time: 05/11/21 10:10 AM   Specimen: Urine, Clean Catch  Result Value Ref Range Status   Specimen Description   Final    URINE, CLEAN CATCH Performed at [facility], [place] [address], [place], [place] [address]    Special Requests   Final    NONE Performed at [facility], [place] [address] [place], [place] [address]    Culture 80,000 COLONIES/mL KLEBSIELLA PNEUMONIAE (A)  Final   Report Status 05/14/2021 FINAL  Final   Organism ID, Bacteria KLEBSIELLA PNEUMONIAE (A)  Final      Susceptibility   Klebsiella pneumoniae - MIC*    AMPICILLIN >=32 RESISTANT Resistant  CEFAZOLIN <=4 SENSITIVE Sensitive     CEFEPIME <=0.12 SENSITIVE Sensitive     CEFTRIAXONE <=0.25 SENSITIVE Sensitive     CIPROFLOXACIN <=0.25 SENSITIVE Sensitive     GENTAMICIN <=1 SENSITIVE Sensitive     IMIPENEM <=0.25 SENSITIVE Sensitive     NITROFURANTOIN 64 INTERMEDIATE Intermediate     TRIMETH/SULFA <=20 SENSITIVE Sensitive     AMPICILLIN/SULBACTAM 8 SENSITIVE Sensitive     PIP/TAZO <=4 SENSITIVE Sensitive     * 80,000 COLONIES/mL KLEBSIELLA PNEUMONIAE  Blood culture (routine single)     Status: Abnormal   Collection Time: 05/15/21  5:10 PM   Specimen: BLOOD  Result Value Ref Range Status   Specimen Description   Final    BLOOD RIGHT ANTECUBITAL Performed at [place] [address], [place], [place] [address]    Special Requests   Final    BOTTLES DRAWN AEROBIC AND ANAEROBIC Blood Culture results may not be optimal due to an excessive volume of blood received in culture bottles Performed at [place] [address] [place], [place] [address]    Culture  Setup Time   Final    GRAM NEGATIVE RODS ANAEROBIC BOTTLE ONLY CRITICAL  RESULT CALLED TO, READ BACK BY AND VERIFIED WITH: PHARMD J.GADHIA AT [id number] ON 05/16/2021 BY T.SAAD. Performed at [facility], [place] [address], [place], [place] [address]    Culture KLEBSIELLA PNEUMONIAE (A)  Final   Report Status 05/18/2021 FINAL  Final   Organism ID, Bacteria KLEBSIELLA PNEUMONIAE  Final      Susceptibility   Klebsiella pneumoniae - MIC*    AMPICILLIN >=32 RESISTANT Resistant     CEFAZOLIN <=4 SENSITIVE Sensitive     CEFEPIME <=0.12 SENSITIVE Sensitive     CEFTAZIDIME <=1 SENSITIVE Sensitive     CEFTRIAXONE <=0.25 SENSITIVE Sensitive     CIPROFLOXACIN <=0.25 SENSITIVE Sensitive     GENTAMICIN <=1 SENSITIVE Sensitive     IMIPENEM <=0.25 SENSITIVE Sensitive     TRIMETH/SULFA <=20 SENSITIVE Sensitive     AMPICILLIN/SULBACTAM 4 SENSITIVE Sensitive     PIP/TAZO <=4 SENSITIVE Sensitive     * KLEBSIELLA PNEUMONIAE  Urine Culture     Status: Abnormal   Collection Time: 05/15/21  5:10 PM   Specimen: In/Out Cath Urine  Result Value Ref Range Status   Specimen Description   Final    IN/OUT CATH URINE Performed at [place] [address], [place], [place] [address]    Special Requests   Final    NONE Performed at [facility], [place] [address], [place], [place] [address]    Culture >=100,000 COLONIES/mL KLEBSIELLA PNEUMONIAE (A)  Final   Report Status 05/18/2021 FINAL  Final   Organism ID, Bacteria KLEBSIELLA PNEUMONIAE (A)  Final      Susceptibility   Klebsiella pneumoniae - MIC*    AMPICILLIN >=32 RESISTANT Resistant     CEFAZOLIN <=4 SENSITIVE Sensitive     CEFEPIME <=0.12 SENSITIVE Sensitive     CEFTRIAXONE <=0.25 SENSITIVE Sensitive     CIPROFLOXACIN <=0.25 SENSITIVE Sensitive     GENTAMICIN <=1 SENSITIVE Sensitive     IMIPENEM <=0.25 SENSITIVE Sensitive     NITROFURANTOIN 64 INTERMEDIATE Intermediate     TRIMETH/SULFA <=20 SENSITIVE Sensitive     AMPICILLIN/SULBACTAM 8 SENSITIVE Sensitive     PIP/TAZO <=4  SENSITIVE Sensitive     * >=100,000 COLONIES/mL KLEBSIELLA PNEUMONIAE  Resp Panel by RT-PCR (Flu A&B, Covid)  Status: None   Collection Time: 05/15/21  5:10 PM   Specimen: Nasopharyngeal(NP) swabs in vial transport medium  Result Value Ref Range Status   SARS Coronavirus 2 by RT PCR NEGATIVE NEGATIVE Final    Comment: (NOTE) SARS-CoV-2 target nucleic acids are NOT DETECTED.  The SARS-CoV-2 RNA is generally detectable in upper respiratory specimens during the acute phase of infection. The lowest concentration of SARS-CoV-2 viral copies this assay can detect is 138 copies/mL. A negative result does not preclude SARS-Cov-2 infection and should not be used as the sole basis for treatment or other patient management decisions. A negative result may occur with  improper specimen collection/handling, submission of specimen other than nasopharyngeal swab, presence of viral mutation(s) within the areas targeted by this assay, and inadequate number of viral copies(<138 copies/mL). A negative result must be combined with clinical observations, patient history, and epidemiological information. The expected result is Negative.  Fact Sheet for Patients:  EntrepreneurPulse.com.au  Fact Sheet for Healthcare Providers:  IncredibleEmployment.be  This test is no t yet approved or cleared by the [place] FDA and  has been authorized for detection and/or diagnosis of SARS-CoV-2 by FDA under an Emergency Use Authorization (EUA). This EUA will remain  in effect (meaning this test can be used) for the duration of the COVID-19 declaration under Section 564(b)(1) of the Act, 21 U.S.C.section 360bbb-3(b)(1), unless the authorization is terminated  or revoked sooner.       Influenza A by PCR NEGATIVE NEGATIVE Final   Influenza B by PCR NEGATIVE NEGATIVE Final    Comment: (NOTE) The Xpert Xpress SARS-CoV-2/FLU/RSV plus assay is intended as an aid in the  diagnosis of influenza from Nasopharyngeal swab specimens and should not be used as a sole basis for treatment. Nasal washings and aspirates are unacceptable for Xpert Xpress SARS-CoV-2/FLU/RSV testing.  Fact Sheet for Patients: EntrepreneurPulse.com.au  Fact Sheet for Healthcare Providers: IncredibleEmployment.be  This test is not yet approved or cleared by the [place] FDA and has been authorized for detection and/or diagnosis of SARS-CoV-2 by FDA under an Emergency Use Authorization (EUA). This EUA will remain in effect (meaning this test can be used) for the duration of the COVID-19 declaration under Section 564(b)(1) of the Act, 21 U.S.C. section 360bbb-3(b)(1), unless the authorization is terminated or revoked.  Performed at [facility], [place] [address] [place],  [address]   Blood Culture ID Panel (Reflexed)     Status: Abnormal   Collection Time: 05/15/21  5:10 PM  Result Value Ref Range Status   Enterococcus faecalis NOT DETECTED NOT DETECTED Final   Enterococcus Faecium NOT DETECTED NOT DETECTED Final   Listeria monocytogenes NOT DETECTED NOT DETECTED Final   Staphylococcus species NOT DETECTED NOT DETECTED Final   Staphylococcus aureus (BCID) NOT DETECTED NOT DETECTED Final   Staphylococcus epidermidis NOT DETECTED NOT DETECTED Final   Staphylococcus lugdunensis NOT DETECTED NOT DETECTED Final   Streptococcus species NOT DETECTED NOT DETECTED Final   Streptococcus agalactiae NOT DETECTED NOT DETECTED Final   Streptococcus pneumoniae NOT DETECTED NOT DETECTED Final   Streptococcus pyogenes NOT DETECTED NOT DETECTED Final   A.calcoaceticus-baumannii NOT DETECTED NOT DETECTED Final   Bacteroides fragilis NOT DETECTED NOT DETECTED Final   Enterobacterales DETECTED (A) NOT DETECTED Final    Comment: Enterobacterales represent a large order of gram negative bacteria, not a single organism. CRITICAL RESULT  CALLED TO, READ BACK BY AND VERIFIED WITH: PHARMD J.GADHIA AT [id number] ON 05/16/2021 BY T.SAAD.  Enterobacter cloacae complex NOT DETECTED NOT DETECTED Final   Escherichia coli NOT DETECTED NOT DETECTED Final   Klebsiella aerogenes NOT DETECTED NOT DETECTED Final   Klebsiella oxytoca NOT DETECTED NOT DETECTED Final   Klebsiella pneumoniae DETECTED (A) NOT DETECTED Final    Comment: CRITICAL RESULT CALLED TO, READ BACK BY AND VERIFIED WITH: PHARMD J.GADHIA AT 1259 ON 05/16/2021 BY T.SAAD.    Proteus species NOT DETECTED NOT DETECTED Final   Salmonella species NOT DETECTED NOT DETECTED Final   Serratia marcescens NOT DETECTED NOT DETECTED Final   Haemophilus influenzae NOT DETECTED NOT DETECTED Final   Neisseria meningitidis NOT DETECTED NOT DETECTED Final   Pseudomonas aeruginosa NOT DETECTED NOT DETECTED Final   Stenotrophomonas maltophilia NOT DETECTED NOT DETECTED Final   Candida albicans NOT DETECTED NOT DETECTED Final   Candida auris NOT DETECTED NOT DETECTED Final   Candida glabrata NOT DETECTED NOT DETECTED Final   Candida krusei NOT DETECTED NOT DETECTED Final   Candida parapsilosis NOT DETECTED NOT DETECTED Final   Candida tropicalis NOT DETECTED NOT DETECTED Final   Cryptococcus neoformans/gattii NOT DETECTED NOT DETECTED Final   CTX-M ESBL NOT DETECTED NOT DETECTED Final   Carbapenem resistance IMP NOT DETECTED NOT DETECTED Final   Carbapenem resistance KPC NOT DETECTED NOT DETECTED Final   Carbapenem resistance NDM NOT DETECTED NOT DETECTED Final   Carbapenem resist OXA 48 LIKE NOT DETECTED NOT DETECTED Final   Carbapenem resistance VIM NOT DETECTED NOT DETECTED Final    Comment: Performed at [facility], [address], [place], [place] [address]  Culture, blood (routine x 2)     Status: None (Preliminary result)   Collection Time: 05/17/21 11:50 AM   Specimen: BLOOD  Result Value Ref Range Status   Specimen Description   Final    BLOOD RIGHT  ANTECUBITAL Performed at [place] [address] [place], [place] [address]    Special Requests   Final    BOTTLES DRAWN AEROBIC AND ANAEROBIC BCLV Performed at [place] [address], [place], [place] [address]    Culture   Final    NO GROWTH 3 DAYS Performed at [facility], [place] [address], [place], [place] [address]    Report Status PENDING  Incomplete  Culture, blood (routine x 2)     Status: None (Preliminary result)   Collection Time: 05/17/21 11:50 AM   Specimen: BLOOD  Result Value Ref Range Status   Specimen Description   Final    BLOOD RT WRIST Performed at [place] [address] [place], [place] [address]    Special Requests   Final    BOTTLES DRAWN AEROBIC AND ANAEROBIC Blood Culture results may not be optimal due to an inadequate volume of blood received in culture bottles Performed at [facility] [address] [place], [place] [address]    Culture   Final    NO GROWTH 3 DAYS Performed at [facility], [place] [address], [place], [place] [address]    Report Status PENDING  Incomplete         Radiology Studies: DG C-Arm 1-60 Min-No Report  Result Date: 05/18/2021 Fluoroscopy was utilized by the requesting physician.  No radiographic interpretation.   IR NEPHROSTOMY PLACEMENT LEFT  Result Date: 05/19/2021 INDICATION: 70 year old with bilateral hydronephrosis and cystocele. Retrograde right ureter stent was successfully placed on 05/18/2021. Unable to place left ureter stent. Patient presents for left percutaneous  nephrostomy tube placement. EXAM: PLACEMENT OF LEFT PERCUTANEOUS NEPHROSTOMY TUBE USING ULTRASOUND AND FLUOROSCOPIC GUIDANCE COMPARISON:  CT 05/17/2021 MEDICATIONS: Inpatient receiving IV antibiotics. ANESTHESIA/SEDATION: Fentanyl 100 mcg IV; Versed 4.0 mg IV, Zofran 4 mg Moderate Sedation Time:  40 minutes The patient was continuously monitored  during the procedure by the interventional radiology nurse under my direct supervision. CONTRAST:  20 mL Omnipaque 300-administered into the collecting system(s) FLUOROSCOPY TIME:  Fluoroscopy Time: 3 minutes 48 seconds (40 mGy). COMPLICATIONS: None immediate. PROCEDURE: Informed written consent was obtained from the patient after a thorough discussion of the procedural risks, benefits and alternatives. All questions were addressed. A timeout was performed prior to the initiation of the procedure. Patient was placed prone. The left flank was prepped and draped in sterile fashion. Maximal barrier sterile technique was utilized including caps, mask, sterile gowns, sterile gloves, sterile drape, hand hygiene and skin antiseptic. Ultrasound was used to identify the left kidney. Left flank was anesthetized using 1% lidocaine. Small incision was made. Using ultrasound guidance, a 21 gauge needle was directed into a mildly dilated mid pole calyx. Contrast injection confirmed placement in the calyx and renal collecting system. A 0.018 wire was successfully advanced into the renal pelvis. Transitional dilator set was placed. Super stiff Amplatz wire was advanced into the renal pelvis and a 10 [place] multipurpose drain was advanced over the wire and reconstituted in the renal pelvis. Follow-up contrast injection was performed. Catheter was flushed with saline and attached to a gravity bag. Catheter was sutured to skin with Prolene suture. Fluoroscopic and ultrasound images were taken and saved for documentation. FINDINGS: Mild to moderate left hydronephrosis. Percutaneous access was obtained in a mid pole calyx. Initial contrast injection demonstrated filling of the renal collecting system and immediate extravasation outside of the collecting system. Findings are similar to the recent retrograde images from 05/18/2021. Catheter placed in the renal pelvis and the left renal collecting system was decompressed at the end of  procedure. IMPRESSION: 1. Successful placement of left percutaneous nephrostomy tube using ultrasound and fluoroscopic guidance. 2. Persistent contrast extravasation from the left renal collecting system. Findings similar to the retrograde images from 05/18/2021. Electronically Signed   By: Markus Daft M.D.   On: 05/19/2021 17:32        Scheduled Meds:  ALPRAZolam  1 mg Oral QHS   Chlorhexidine Gluconate Cloth  6 each Topical Daily   estradiol  1 mg Oral Daily   ipratropium  1 spray Each Nare Daily   liothyronine  25 mcg Oral Daily   loratadine  10 mg Oral Daily   mometasone-formoterol  2 puff Inhalation BID   montelukast  10 mg Oral QHS   pantoprazole  40 mg Oral Daily   polyethylene glycol  17 g Oral Daily   potassium chloride  40 mEq Oral BID   Continuous Infusions:  meropenem (MERREM) IV 1 g (05/20/21 1142)     LOS: 4 days    Time spent: 30 minutes    Barb Merino, MD [facility] Pager [phone]

## 2021-05-21 DIAGNOSIS — N390 Urinary tract infection, site not specified: Secondary | ICD-10-CM | POA: Diagnosis not present

## 2021-05-21 DIAGNOSIS — A419 Sepsis, unspecified organism: Secondary | ICD-10-CM | POA: Diagnosis not present

## 2021-05-21 LAB — CBC WITH DIFFERENTIAL/PLATELET
Abs Immature Granulocytes: 0.53 10*3/uL — ABNORMAL HIGH (ref 0.00–0.07)
Basophils Absolute: 0.1 10*3/uL (ref 0.0–0.1)
Basophils Relative: 0 %
Eosinophils Absolute: 0.1 10*3/uL (ref 0.0–0.5)
Eosinophils Relative: 1 %
HCT: 28.2 % — ABNORMAL LOW (ref 36.0–46.0)
Hemoglobin: 8.9 g/dL — ABNORMAL LOW (ref 12.0–15.0)
Immature Granulocytes: 3 %
Lymphocytes Relative: 13 %
Lymphs Abs: 2 10*3/uL (ref 0.7–4.0)
MCH: 27.3 pg (ref 26.0–34.0)
MCHC: 31.6 g/dL (ref 30.0–36.0)
MCV: 86.5 fL (ref 80.0–100.0)
Monocytes Absolute: 1 10*3/uL (ref 0.1–1.0)
Monocytes Relative: 6 %
Neutro Abs: 12.1 10*3/uL — ABNORMAL HIGH (ref 1.7–7.7)
Neutrophils Relative %: 77 %
Platelets: 660 10*3/uL — ABNORMAL HIGH (ref 150–400)
RBC: 3.26 MIL/uL — ABNORMAL LOW (ref 3.87–5.11)
RDW: 14.7 % (ref 11.5–15.5)
WBC: 15.8 10*3/uL — ABNORMAL HIGH (ref 4.0–10.5)
nRBC: 0 % (ref 0.0–0.2)

## 2021-05-21 LAB — BASIC METABOLIC PANEL
Anion gap: 8 (ref 5–15)
BUN: 9 mg/dL (ref 8–23)
CO2: 24 mmol/L (ref 22–32)
Calcium: 8.1 mg/dL — ABNORMAL LOW (ref 8.9–10.3)
Chloride: 106 mmol/L (ref 98–111)
Creatinine, Ser: 0.77 mg/dL (ref 0.44–1.00)
GFR, Estimated: >60 mL/min (ref >60)
Glucose, Bld: 105 mg/dL — ABNORMAL HIGH (ref 70–99)
Potassium: 3.1 mmol/L — ABNORMAL LOW (ref 3.5–5.1)
Sodium: 138 mmol/L (ref 135–145)

## 2021-05-21 MED ORDER — SODIUM CHLORIDE 0.9 % IV SOLN
1.0000 g | Freq: Three times a day (TID) | INTRAVENOUS | Status: DC
  Administered 2021-05-21 – 2021-05-26 (×14): 1 g via INTRAVENOUS
  Filled 2021-05-21 (×15): qty 1

## 2021-05-21 MED ORDER — POTASSIUM CHLORIDE CRYS ER 10 MEQ PO TBCR
40.0000 meq | EXTENDED_RELEASE_TABLET | Freq: Once | ORAL | Status: AC
  Administered 2021-05-21: 40 meq via ORAL
  Filled 2021-05-21: qty 4

## 2021-05-21 NOTE — Progress Notes (Signed)
Pharmacy Antibiotic Note  Theresa Holloway is a 70 y.o. female with hx partial nephrectomy for left-sided renal cancer and bladder prolapse presented to the ED on 05/15/2021 with c/o fever, dysuria and hematuria. She was found to have bilateral hydronephrosis and underwent cystoscopy with right ureteral stent placement on 8/4.  UCx collected at [facility] urology on 8/1 had ESBL Ecoli. Inpatient ucx  and blood cx on 8/1 are growing out klebsiella pneumo. She's currently on meropenem for UTI and bacteremia.   Today, 05/21/2021: - Day #3 meropenem - afeb, wbc elevated but trending down - scr trending down (0.77, crcl~60)  Plan: - adjust meropenem to 1gm q8h for renal function  ________________________________________  Height: '5\' 6"'$  (167.6 cm) Weight: 56.9 kg (125 lb 7.1 oz) IBW/kg (Calculated) : 59.3  Temp (24hrs), Avg:98.6 F (37 C), Min:98.4 F (36.9 C), Max:98.8 F (37.1 C)  Recent Labs  Lab 05/15/21 1709 05/16/21 0451 05/16/21 0814 05/17/21 0456 05/18/21 0525 05/19/21 0509 05/20/21 0557 05/21/21 0530  WBC 25.5*  --  23.8* 21.7* 18.3* 30.2* 19.1* 15.8*  CREATININE 1.02*   < >  --  1.60* 2.11* 1.88* 1.21* 0.77  LATICACIDVEN 1.1  --  1.2  --   --   --   --   --   GENTRANDOM  --   --   --   --   --  7.3  --   --    < > = values in this interval not displayed.    Estimated Creatinine Clearance: 60.5 mL/min (by C-G formula based on SCr of 0.77 mg/dL).    Allergies  Allergen Reactions   Codeine Anaphylaxis   Morphine And Related Anaphylaxis   Penicillins Anaphylaxis   Sulfa Antibiotics Anaphylaxis   Other     Cat gut suture - too long to heal    Prednisone     Excessive weight gain - 35lbs in 10 days   Skyrizi [Risankizumab] Nausea And Vomiting    Pt also got blisters when taking this med    Thank you for allowing pharmacy to be a part of this patient's care.  Lynelle Doctor 05/21/2021 11:28 AM

## 2021-05-21 NOTE — Progress Notes (Signed)
PT Cancellation Note  Patient Details Name: Theresa Holloway MRN: [id number] DOB: [birth year]   Cancelled Treatment:     PT attempted but pt refused c/o fatigue but agreeable to being check on this pm.  New communication with RN indicates pt is now willing to participate with PT if female PT is available.  Pt will be followed by female PT when available.   Avo Schlachter 05/21/2021, 12:24 PM

## 2021-05-21 NOTE — Progress Notes (Signed)
Referring Physician(s):Dr. Abner Greenspan, M.   Supervising Physician: Ruthann Cancer  Patient Status:  [facility] - In-pt  Chief Complaint:  Left hydronephrosis S/p left PCN placement with Dr. Anselm Pancoast on 05/19/2021  Subjective:  Patient laying in bed not in acute distress, friend at the bedside. States that she is tired of being in the hospital. No complaints regarding the PCN.  Allergies: Codeine, Morphine and related, Penicillins, Sulfa antibiotics, Other, Prednisone, and Skyrizi [risankizumab]  Medications: Prior to Admission medications   Medication Sig Start Date End Date Taking? Authorizing Provider  acetaminophen (TYLENOL) 500 MG tablet Take 1,000 mg by mouth every 6 (six) hours as needed for moderate pain.   Yes [provider]  acyclovir (ZOVIRAX) 800 MG tablet Take 800 mg by mouth 5 (five) times daily as needed (shingles). 04/11/21  Yes [provider]  ALPRAZolam Duanne Moron) 1 MG tablet Take 1 mg by mouth at bedtime.  01/20/15  Yes [provider]  azithromycin (ZITHROMAX) 250 MG tablet Take 250 mg by mouth See admin instructions. Zpack 05/13/21  Yes [provider]  cetirizine (ZYRTEC) 10 MG tablet Take 10 mg by mouth daily.   Yes [provider]  estradiol (ESTRACE) 1 MG tablet Take 1 tablet (1 mg total) by mouth daily. 04/06/21  Yes Estill Dooms, NP  fluticasone (FLONASE) 50 MCG/ACT nasal spray Place 1 spray into both nostrils in the morning and at bedtime. 03/06/21  Yes [provider]  ipratropium (ATROVENT) 0.03 % nasal spray Place 1 spray into both nostrils daily. 05/11/21  Yes [provider]  liothyronine (CYTOMEL) 25 MCG tablet Take 25 mcg by mouth daily. 01/08/21  Yes [provider]  meclizine (ANTIVERT) 25 MG tablet Take 25 mg by mouth 3 (three) times daily as needed for dizziness.   Yes [provider]  montelukast (SINGULAIR) 10 MG tablet Take 10 mg by mouth at bedtime. 02/07/21  Yes [provider]  OTEZLA 30 MG TABS TAKE 1 TABLET BY MOUTH 2 TIMES A DAY. Patient taking differently: Take 30 mg by mouth in the morning and at bedtime. 01/24/21  Yes Lavonna Monarch, MD  pantoprazole (PROTONIX) 40 MG tablet Take 40 mg by mouth daily.   Yes [provider]  Probiotic Product (TRUBIOTICS) CAPS Take 1 capsule by mouth daily.   Yes [provider]  RESTASIS 0.05 % ophthalmic emulsion Place 1 drop into both eyes 2 (two) times daily. 11/26/20  Yes [provider]  SYMBICORT 160-4.5 MCG/ACT inhaler Inhale 2 puffs into the lungs 2 (two) times daily as needed (wheezing). 01/27/21  Yes [provider]  Apremilast (OTEZLA) 10 & 20 & 30 MG TBPK Take as directed per package instructions. Patient not taking: No sig reported 06/08/20   Lavonna Monarch, MD     Vital Signs: BP (!) 131/48 (BP Location: Left Arm)   Pulse 92   Temp 98.2 F (36.8 C) (Oral)   Resp 16   Ht '5\' 6"'$  (1.676 m)   Wt 125 lb 7.1 oz (56.9 kg)   SpO2 94%   BMI 20.25 kg/m   Physical Exam Vitals reviewed.  Constitutional:      Appearance: Normal appearance.  HENT:     Head: Normocephalic and atraumatic.  Pulmonary:     Effort: Pulmonary effort is normal.  Abdominal:     General: Abdomen is flat.     Palpations: Abdomen is soft.  Musculoskeletal:     Cervical back: Neck supple.  Skin:  General: Skin is warm and dry.     Coloration: Skin is not jaundiced or pale.     Comments: Positive L PCN drain to gravity bag. Site is unremarkable with no erythema, edema, tenderness, bleeding or drainage. Suture and stat lock in place. Dressing is clean, dry, and intact.  Trace of urine colored fluid noted in the gravity bag.   Neurological:     Mental Status: She is alert and oriented to person, place, and time.  Psychiatric:        Mood and Affect: Mood normal.        Behavior: Behavior normal.        Judgment: Judgment normal.    Imaging: CT ABDOMEN PELVIS WO CONTRAST  Result Date:  05/17/2021 CLINICAL DATA:  Abdominal pain, gram-negative bacteremia. EXAM: CT ABDOMEN AND PELVIS WITHOUT CONTRAST TECHNIQUE: Multidetector CT imaging of the abdomen and pelvis was performed following the standard protocol without IV contrast. COMPARISON:  09/09/2012 CT abdomen FINDINGS: Lower chest: Descending thoracic aortic atherosclerotic calcification. Low-density blood pool suggesting anemia. Small bilateral pleural effusions. Peripheral atelectasis in the right lower lobe. Airway thickening is present, suggesting bronchitis or reactive airways disease. Calcified granuloma in the lingula (benign). Hepatobiliary: Cholecystectomy.  Otherwise unremarkable. Pancreas: Unremarkable Spleen: Unremarkable Adrenals/Urinary Tract: 1.0 cm nodule of the lateral limb left adrenal gland is unchanged from 09/09/2012 hence likely benign, but with indeterminate density. Substantial bilateral perirenal stranding along with moderate to prominent bilateral hydronephrosis and hydroureter. There is pelvic floor laxity with a cystocele and with the distal ureters extending down well below the pubococcygeal line in presumed herniated bladder. This extends down beyond the inferior margin of today's examination. Both kidneys appear expanded and with multiple cystic lesions. Some of the lesions in the kidneys are hyperdense likely reflecting complex cysts although technically nonspecific. Stable findings of partial nephrectomy along the right kidney lower pole. Stomach/Bowel: Low position of the anorectal junction due to pelvic floor laxity. Vascular/Lymphatic: Aortoiliac atherosclerotic vascular disease. Reproductive: Hysterectomy. Nonvisualization of the vagina probably attributable to the severe pelvic floor laxity. Other: Small amount of free pelvic fluid in the paracolic gutters. Presacral edema. Subcutaneous edema. Low-grade central mesenteric edema. Musculoskeletal: Posterolateral rod and pedicle screw fixation bilaterally at L3-4.  Solid interbody fusion at L4-5. Posterior decompression from L3 through L5. Small supraumbilical hernia containing adipose tissue. IMPRESSION: 1. Prominent bilateral hydronephrosis and hydroureter with prominent pelvic floor laxity allowing the urinary bladder and UVJ region to herniate well below the level of the ischial tuberosities. I suspect the prominent cystocele may be causing some traction on the ureterovesical junctions leading to the bilateral obstruction. 2. There is substantial bilateral perirenal stranding along with scattered renal lesions of varying density which are probably cysts but technically nonspecific. 3. Third spacing of fluid with a small amount of ascites, mesenteric edema, small bilateral pleural effusions, as well as subcutaneous edema and presacral edema. 4. Low position of the anorectal junction. Nonvisualization of the vagina related to pelvic floor laxity. 5. Low-density blood pool suggests anemia. 6. Airway thickening is present, suggesting bronchitis or reactive airways disease. 7.  Aortic Atherosclerosis (ICD10-I70.0). 8. Small supraumbilical hernia contains adipose tissue. 9. Chronically stable small left adrenal mass, benign. Electronically Signed   By: Van Clines M.D.   On: 05/17/2021 19:33   DG C-Arm 1-60 Min-No Report  Result Date: 05/18/2021 Fluoroscopy was utilized by the requesting physician.  No radiographic interpretation.   IR NEPHROSTOMY PLACEMENT LEFT  Result Date: 05/19/2021 INDICATION: 70 year old with bilateral hydronephrosis and  cystocele. Retrograde right ureter stent was successfully placed on 05/18/2021. Unable to place left ureter stent. Patient presents for left percutaneous nephrostomy tube placement. EXAM: PLACEMENT OF LEFT PERCUTANEOUS NEPHROSTOMY TUBE USING ULTRASOUND AND FLUOROSCOPIC GUIDANCE COMPARISON:  CT 05/17/2021 MEDICATIONS: Inpatient receiving IV antibiotics. ANESTHESIA/SEDATION: Fentanyl 100 mcg IV; Versed 4.0 mg IV, Zofran 4 mg  Moderate Sedation Time:  40 minutes The patient was continuously monitored during the procedure by the interventional radiology nurse under my direct supervision. CONTRAST:  20 mL Omnipaque 300-administered into the collecting system(s) FLUOROSCOPY TIME:  Fluoroscopy Time: 3 minutes 48 seconds (40 mGy). COMPLICATIONS: None immediate. PROCEDURE: Informed written consent was obtained from the patient after a thorough discussion of the procedural risks, benefits and alternatives. All questions were addressed. A timeout was performed prior to the initiation of the procedure. Patient was placed prone. The left flank was prepped and draped in sterile fashion. Maximal barrier sterile technique was utilized including caps, mask, sterile gowns, sterile gloves, sterile drape, hand hygiene and skin antiseptic. Ultrasound was used to identify the left kidney. Left flank was anesthetized using 1% lidocaine. Small incision was made. Using ultrasound guidance, a 21 gauge needle was directed into a mildly dilated mid pole calyx. Contrast injection confirmed placement in the calyx and renal collecting system. A 0.018 wire was successfully advanced into the renal pelvis. Transitional dilator set was placed. Super stiff Amplatz wire was advanced into the renal pelvis and a 10 [place] multipurpose drain was advanced over the wire and reconstituted in the renal pelvis. Follow-up contrast injection was performed. Catheter was flushed with saline and attached to a gravity bag. Catheter was sutured to skin with Prolene suture. Fluoroscopic and ultrasound images were taken and saved for documentation. FINDINGS: Mild to moderate left hydronephrosis. Percutaneous access was obtained in a mid pole calyx. Initial contrast injection demonstrated filling of the renal collecting system and immediate extravasation outside of the collecting system. Findings are similar to the recent retrograde images from 05/18/2021. Catheter placed in the renal  pelvis and the left renal collecting system was decompressed at the end of procedure. IMPRESSION: 1. Successful placement of left percutaneous nephrostomy tube using ultrasound and fluoroscopic guidance. 2. Persistent contrast extravasation from the left renal collecting system. Findings similar to the retrograde images from 05/18/2021. Electronically Signed   By: Markus Daft M.D.   On: 05/19/2021 17:32    Labs:  CBC: Recent Labs    05/18/21 0525 05/19/21 0509 05/20/21 0557 05/21/21 0530  WBC 18.3* 30.2* 19.1* 15.8*  HGB 8.4* 10.1* 9.6* 8.9*  HCT 26.1* 31.4* 30.4* 28.2*  PLT 512* 700* 758* 660*    COAGS: Recent Labs    05/15/21 1709 05/18/21 1945  INR 1.0 1.2  APTT 44*  --     BMP: Recent Labs    05/18/21 0525 05/19/21 0509 05/20/21 0557 05/21/21 0530  NA 131* 138 135 138  K 3.5 3.9 3.0* 3.1*  CL 106 110 106 106  CO2 18* 16* 21* 24  GLUCOSE 109* 134* 96 105*  BUN '17 19 16 9  '$ CALCIUM 7.5* 8.5* 8.1* 8.1*  CREATININE 2.11* 1.88* 1.21* 0.77  GFRNONAA 25* 29* 49* >60    LIVER FUNCTION TESTS: Recent Labs    05/11/21 1010 05/15/21 1709  BILITOT 0.4 0.6  AST 14* 16  ALT 14 17  ALKPHOS 79 106  PROT 6.8 7.1  ALBUMIN 3.0* 2.9*    Assessment and Plan:  70 year old female with hx of  CKD, s/p partial nephrectomy for left  renal cancer in 2009, presented to [facility] ED and was found to have left hydronephrosis.  S/p left PCN drain placement with Dr. Anselm Pancoast on 05/19/2021.  Pt stable, drain intact. Puncture site unremarkable, no s/s of bleeding or infection.  OP 650 cc -the bag was empty when it was evaluated, patient states that the output does not look like normal urine.  VSS WBC 15.8 today (19.9 yesterday) RF improving, BUN 9, creatinine 0.77, GFR greater than 60 (BUN 16, creatinine 1.21, GFR 46 yesterday)  Continue with flushing TID, output recording q shift and dressing changes as needed.   Further treatment plan per [facility]/Urology Appreciate and agree with the plan.  IR  to follow.    Electronically Signed: Tera Mater, PA-C 05/21/2021, 3:32 PM   I spent a total of 15 Minutes at the the patient's bedside AND on the patient's hospital floor or unit, greater than 50% of which was counseling/coordinating care for L PCN

## 2021-05-21 NOTE — Care Management (Signed)
Encouraged pt to get up and move with pt. She asked if I would help if she did and also asked for a female pt. Msg sent to pt asking for female Theresa Holloway, said she will try to get to her today if she can. Advised pt either I or NT Maddie would help if I went was able to get a female pt she promised to try.

## 2021-05-21 NOTE — Progress Notes (Signed)
3 Days Post-Op Subjective: Pain controlled.  No nausea or emesis.  Afebrile. Creatinine improved to 0.77  She denies any flank pain  Objective: Vital signs in last 24 hours: Temp:  [98.4 F (36.9 C)-98.8 F (37.1 C)] 98.4 F (36.9 C) (08/07 0450) Pulse Rate:  [88-90] 89 (08/07 0450) Resp:  [16-20] 16 (08/07 0450) BP: (123-126)/(51-64) 123/64 (08/07 0450) SpO2:  [92 %-96 %] 92 % (08/07 0808)  Intake/Output from previous day: 08/06 0701 - 08/07 0700 In: 73 [P.O.:472; IV Piggyback:350] Out: 3200 [Urine:3200] Intake/Output this shift: Total I/O In: -  Out: 375 [Urine:375]  UOP: 2.6L clear  Physical Exam:  General: Alert and oriented CV: RRR Lungs: Clear Abdomen: Soft, ND, NT Ext: NT, No erythema GU: Cystocele present and bladder prolapsed. With a chaperone present, I was able to reduce her bladder back into her vaginal vault. She tolerated well.  Lab Results: Recent Labs    05/19/21 0509 05/20/21 0557 05/21/21 0530  HGB 10.1* 9.6* 8.9*  HCT 31.4* 30.4* 28.2*   BMET Recent Labs    05/20/21 0557 05/21/21 0530  NA 135 138  K 3.0* 3.1*  CL 106 106  CO2 21* 24  GLUCOSE 96 105*  BUN 16 9  CREATININE 1.21* 0.77  CALCIUM 8.1* 8.1*     Studies/Results: IR NEPHROSTOMY PLACEMENT LEFT  Result Date: 05/19/2021 INDICATION: 70 year old with bilateral hydronephrosis and cystocele. Retrograde right ureter stent was successfully placed on 05/18/2021. Unable to place left ureter stent. Patient presents for left percutaneous nephrostomy tube placement. EXAM: PLACEMENT OF LEFT PERCUTANEOUS NEPHROSTOMY TUBE USING ULTRASOUND AND FLUOROSCOPIC GUIDANCE COMPARISON:  CT 05/17/2021 MEDICATIONS: Inpatient receiving IV antibiotics. ANESTHESIA/SEDATION: Fentanyl 100 mcg IV; Versed 4.0 mg IV, Zofran 4 mg Moderate Sedation Time:  40 minutes The patient was continuously monitored during the procedure by the interventional radiology nurse under my direct supervision. CONTRAST:  20 mL Omnipaque  300-administered into the collecting system(s) FLUOROSCOPY TIME:  Fluoroscopy Time: 3 minutes 48 seconds (40 mGy). COMPLICATIONS: None immediate. PROCEDURE: Informed written consent was obtained from the patient after a thorough discussion of the procedural risks, benefits and alternatives. All questions were addressed. A timeout was performed prior to the initiation of the procedure. Patient was placed prone. The left flank was prepped and draped in sterile fashion. Maximal barrier sterile technique was utilized including caps, mask, sterile gowns, sterile gloves, sterile drape, hand hygiene and skin antiseptic. Ultrasound was used to identify the left kidney. Left flank was anesthetized using 1% lidocaine. Small incision was made. Using ultrasound guidance, a 21 gauge needle was directed into a mildly dilated mid pole calyx. Contrast injection confirmed placement in the calyx and renal collecting system. A 0.018 wire was successfully advanced into the renal pelvis. Transitional dilator set was placed. Super stiff Amplatz wire was advanced into the renal pelvis and a 10 [place] multipurpose drain was advanced over the wire and reconstituted in the renal pelvis. Follow-up contrast injection was performed. Catheter was flushed with saline and attached to a gravity bag. Catheter was sutured to skin with Prolene suture. Fluoroscopic and ultrasound images were taken and saved for documentation. FINDINGS: Mild to moderate left hydronephrosis. Percutaneous access was obtained in a mid pole calyx. Initial contrast injection demonstrated filling of the renal collecting system and immediate extravasation outside of the collecting system. Findings are similar to the recent retrograde images from 05/18/2021. Catheter placed in the renal pelvis and the left renal collecting system was decompressed at the end of procedure. IMPRESSION: 1. Successful placement  of left percutaneous nephrostomy tube using ultrasound and fluoroscopic  guidance. 2. Persistent contrast extravasation from the left renal collecting system. Findings similar to the retrograde images from 05/18/2021. Electronically Signed   By: Markus Daft M.D.   On: 05/19/2021 17:32    Assessment/Plan: #1.  Cystocele: She has a planned robotic assisted laparoscopic sacrocolpopexy with Dr. Louis Meckel on 05/24/2021.  Given her UTI with bacteremia, this case may be delayed.     2.  Bilateral hydroureteronephrosis: Seen on CT A/P 05/17/2021 to the bilateral UVJ likely caused by her cystocele.  S/p cystoscopy, right ureteral stent placement, attempted left ureteral stent placement.  Left nephrostomy tube place by Interventional radiology.    3.  Acute kidney injury: Her creatinine on 05/18/2021 is 2.1 elevated from baseline of 1. Creatinine improved to 1.2     4. UTI: Ucx 05/15/2021 with ESBL E. coli in the office and Klebsiella in the hospital.  She was on ancef at time of consultation however, I note that her Ucx 8/1 with ESBL E coli was resistence to ancef.  She was switched to gentamicin and is doing well  Urology to continue to follow     LOS: 5 days   Matt R. Gay MD 05/21/2021, 9:45 AM [facility]  Pager: [phone]

## 2021-05-21 NOTE — Progress Notes (Signed)
PROGRESS NOTE    Theresa Holloway  [id number] DOB: [birth year] DOA: 05/15/2021 PCP: Sharilyn Sites, MD    Brief Narrative:  70 year old female with history of COPD, asthma, anxiety, history of partial nephrectomy for left-sided renal cancer presented to the emergency room with fever, dysuria and hematuria.  Patient has been suffering from bladder prolapse and followed up with urology with planned sling surgery on 8/10.  Patient had frequency and dysuria and was given Foley catheter on urology visit on 8/1.  Urine culture taken on 7/28 for preop eval was positive for ESBL E. coli.  Blood cultures on 8/1 positive for Klebsiella.  Urine culture also with Klebsiella on repeat exam. Urine culture from office on 7/28 positive for ESBL E. coli. With persistent elevated white count found to have bilateral severe hydronephrosis. Cystoscopy on 8/4, stenting right ureter.  Left ureter unable to stent.  IR DID PERCUTANEOUS NEPHROSTOMY ON THE LEFT SIDE 8/5.    Assessment & Plan:   Principal Problem:   Sepsis secondary to UTI [facility]) Active Problems:   Sepsis ([place])   Hypokalemia   AKI (acute kidney injury) ([place])   Bladder prolapse, female, acquired  Sepsis secondary to Klebsiella bacteremia/Klebsiella UTI: Recent ESBL E. coli. Treated with different antibiotics.  Urine culture sensitivity available from the office with ESBL. Currently on meropenem. Severe bilateral hydronephrosis with prolapsed bladder Status post cystoscopy, right-sided stenting of the ureter.  Unable to stent left side. IR did percutaneous nephrostomy 8/5.  May need internalization of the stent.  Followed by urology.Patient has prolapsed bladder and is planned for sling procedure, hopefully after improvement of acute infection. Urology anticipating internalization of left stent before discharge.  Acute kidney injury: Due to #1.  Continue IV fluid resuscitation.  Renal function stabilizing.  Continue Foley catheter care and  nephrostomy care.  Hypokalemia/hypomagnesemia: Replace potassium further.  Magnesium normalized.  Hypothyroidism: Euthyroid on replacement.  Continue.  Start mobilizing.  Work with physical therapy.   DVT prophylaxis: Place and maintain sequential compression device Start: 05/18/21 1146 SCD   Code Status: Full code Family Communication: Friend at the bedside. Disposition Plan: Status is: Inpatient  Remains inpatient appropriate because:Inpatient level of care appropriate due to severity of illness  Dispo: The patient is from: Home              Anticipated d/c is to: Home              Patient currently is not medically stable to d/c.   Difficult to place patient No         Consultants:  Urology Interventional radiology  Procedures:  None  Antimicrobials:  Ancef 8/1--- 8/4 Gentamicin 8/4 Meropenem 8/5----   Subjective: Seen and examined.  Has some abdominal distention.  Also complains of pain on the both side of the lateral chest wall and moving around. She declined to move with physical therapy. Remains afebrile.  Friend at the bedside.  Objective: Vitals:   05/20/21 2024 05/20/21 2033 05/21/21 0450 05/21/21 0808  BP: (!) 125/58  123/64   Pulse: 90  89   Resp: 20  16   Temp: 98.5 F (36.9 C)  98.4 F (36.9 C)   TempSrc: Oral  Oral   SpO2: 96% 92% 93% 92%  Weight:      Height:        Intake/Output Summary (Last 24 hours) at 05/21/2021 1250 Last data filed at 05/21/2021 1140 Gross per 24 hour  Intake 822 ml  Output 2975 ml  Net -2153 ml   Filed Weights   05/15/21 1617 05/18/21 1544  Weight: 56.9 kg 56.9 kg    Examination:  General:  On room air and comfortable on conversation.  Anxious. Cardiovascular: S1-S2 normal.  No added sounds. Respiratory: Bilateral clear.  No added sounds. Gastrointestinal: Soft.  Nontender. Foley catheter with clear urine. Left nephrostomy with clear urine. Ext: No swelling or edema.  No cyanosis. Neuro: Grossly  normal. Musculoskeletal: No deformities.      Data Reviewed: I have personally reviewed following labs and imaging studies  CBC: Recent Labs  Lab 05/15/21 1709 05/16/21 0814 05/17/21 0456 05/18/21 0525 05/19/21 0509 05/20/21 0557 05/21/21 0530  WBC 25.5*   < > 21.7* 18.3* 30.2* 19.1* 15.8*  NEUTROABS 22.3*  --   --  15.9* 27.4* 15.4* 12.1*  HGB 10.0*   < > 8.6* 8.4* 10.1* 9.6* 8.9*  HCT 31.0*   < > 27.0* 26.1* 31.4* 30.4* 28.2*  MCV 87.8   < > 89.4 89.1 87.7 87.4 86.5  PLT 615*   < > 520* 512* 700* 758* 660*   < > = values in this interval not displayed.   Basic Metabolic Panel: Recent Labs  Lab 05/17/21 0456 05/18/21 0525 05/19/21 0509 05/20/21 0557 05/21/21 0530  NA 132* 131* 138 135 138  K 3.5 3.5 3.9 3.0* 3.1*  CL 103 106 110 106 106  CO2 19* 18* 16* 21* 24  GLUCOSE 115* 109* 134* 96 105*  BUN '17 17 19 16 9  '$ CREATININE 1.60* 2.11* 1.88* 1.21* 0.77  CALCIUM 7.7* 7.5* 8.5* 8.1* 8.1*  MG 1.5* 1.9 1.9  --   --   PHOS  --  2.9 3.8  --   --    GFR: Estimated Creatinine Clearance: 60.5 mL/min (by C-G formula based on SCr of 0.77 mg/dL). Liver Function Tests: Recent Labs  Lab 05/15/21 1709  AST 16  ALT 17  ALKPHOS 106  BILITOT 0.6  PROT 7.1  ALBUMIN 2.9*   No results for input(s): LIPASE, AMYLASE in the last 168 hours. No results for input(s): AMMONIA in the last 168 hours. Coagulation Profile: Recent Labs  Lab 05/15/21 1709 05/18/21 1945  INR 1.0 1.2   Cardiac Enzymes: No results for input(s): CKTOTAL, CKMB, CKMBINDEX, TROPONINI in the last 168 hours. BNP (last 3 results) No results for input(s): PROBNP in the last 8760 hours. HbA1C: No results for input(s): HGBA1C in the last 72 hours. CBG: No results for input(s): GLUCAP in the last 168 hours. Lipid Profile: No results for input(s): CHOL, HDL, LDLCALC, TRIG, CHOLHDL, LDLDIRECT in the last 72 hours. Thyroid Function Tests: No results for input(s): TSH, T4TOTAL, FREET4, T3FREE, THYROIDAB in  the last 72 hours. Anemia Panel: No results for input(s): VITAMINB12, FOLATE, FERRITIN, TIBC, IRON, RETICCTPCT in the last 72 hours. Sepsis Labs: Recent Labs  Lab 05/15/21 1709 05/16/21 0814  LATICACIDVEN 1.1 1.2    Recent Results (from the past 240 hour(s))  Blood culture (routine single)     Status: Abnormal   Collection Time: 05/15/21  5:10 PM   Specimen: BLOOD  Result Value Ref Range Status   Specimen Description   Final    BLOOD RIGHT ANTECUBITAL Performed at [facility] [address] [place], [place] [address]    Special Requests   Final    BOTTLES DRAWN AEROBIC AND ANAEROBIC Blood Culture results may not be optimal due to an excessive volume of blood received in culture bottles Performed at [facility], [place] [address], [place], [place] [address]    Culture  Setup Time   Final    GRAM NEGATIVE RODS ANAEROBIC BOTTLE ONLY CRITICAL RESULT CALLED TO, READ BACK BY AND VERIFIED WITH: PHARMD J.GADHIA AT [id number] ON 05/16/2021 BY T.SAAD. Performed at [facility], [place] [address], [place], [place] [address]    Culture KLEBSIELLA PNEUMONIAE (A)  Final   Report Status 05/18/2021 FINAL  Final   Organism ID, Bacteria KLEBSIELLA PNEUMONIAE  Final      Susceptibility   Klebsiella pneumoniae - MIC*    AMPICILLIN >=32 RESISTANT Resistant     CEFAZOLIN <=4 SENSITIVE Sensitive     CEFEPIME <=0.12 SENSITIVE Sensitive     CEFTAZIDIME <=1 SENSITIVE Sensitive     CEFTRIAXONE <=0.25 SENSITIVE Sensitive     CIPROFLOXACIN <=0.25 SENSITIVE Sensitive     GENTAMICIN <=1 SENSITIVE Sensitive     IMIPENEM <=0.25 SENSITIVE Sensitive     TRIMETH/SULFA <=20 SENSITIVE Sensitive     AMPICILLIN/SULBACTAM 4 SENSITIVE Sensitive     PIP/TAZO <=4 SENSITIVE Sensitive     * KLEBSIELLA PNEUMONIAE  Urine Culture     Status: Abnormal   Collection Time: 05/15/21  5:10 PM   Specimen: In/Out Cath Urine  Result Value Ref Range Status   Specimen Description   Final     IN/OUT CATH URINE Performed at [facility] [address], [place], [place] [address]    Special Requests   Final    NONE Performed at [facility], [place] [address], [place], [place] [address]    Culture >=100,000 COLONIES/mL KLEBSIELLA PNEUMONIAE (A)  Final   Report Status 05/18/2021 FINAL  Final   Organism ID, Bacteria KLEBSIELLA PNEUMONIAE (A)  Final      Susceptibility   Klebsiella pneumoniae - MIC*    AMPICILLIN >=32 RESISTANT Resistant     CEFAZOLIN <=4 SENSITIVE Sensitive     CEFEPIME <=0.12 SENSITIVE Sensitive     CEFTRIAXONE <=0.25 SENSITIVE Sensitive     CIPROFLOXACIN <=0.25 SENSITIVE Sensitive     GENTAMICIN <=1 SENSITIVE Sensitive     IMIPENEM <=0.25 SENSITIVE Sensitive     NITROFURANTOIN 64 INTERMEDIATE Intermediate     TRIMETH/SULFA <=20 SENSITIVE Sensitive     AMPICILLIN/SULBACTAM 8 SENSITIVE Sensitive     PIP/TAZO <=4 SENSITIVE Sensitive     * >=100,000 COLONIES/mL KLEBSIELLA PNEUMONIAE  Resp Panel by RT-PCR (Flu A&B, Covid)     Status: None   Collection Time: 05/15/21  5:10 PM   Specimen: Nasopharyngeal(NP) swabs in vial transport medium  Result Value Ref Range Status   SARS Coronavirus 2 by RT PCR NEGATIVE NEGATIVE Final    Comment: (NOTE) SARS-CoV-2 target nucleic acids are NOT DETECTED.  The SARS-CoV-2 RNA is generally detectable in upper respiratory specimens during the acute phase of infection. The lowest concentration of SARS-CoV-2 viral copies this assay can detect is 138 copies/mL. A negative result does not preclude SARS-Cov-2 infection and should not be used as the sole basis for treatment or other patient management decisions. A negative result may occur with  improper specimen collection/handling, submission of specimen other than nasopharyngeal swab, presence of viral mutation(s) within the areas targeted by this assay, and inadequate number of viral copies(<138 copies/mL). A negative result must be  combined with clinical observations, patient history, and epidemiological information. The expected result is Negative.  Fact Sheet for Patients:  EntrepreneurPulse.com.au  Fact Sheet for Healthcare Providers:  IncredibleEmployment.be  This test is no t  yet approved or cleared by the [place] and  has been authorized for detection and/or diagnosis of SARS-CoV-2 by FDA under an Emergency Use Authorization (EUA). This EUA will remain  in effect (meaning this test can be used) for the duration of the COVID-19 declaration under Section 564(b)(1) of the Act, 21 U.S.C.section 360bbb-3(b)(1), unless the authorization is terminated  or revoked sooner.       Influenza A by PCR NEGATIVE NEGATIVE Final   Influenza B by PCR NEGATIVE NEGATIVE Final    Comment: (NOTE) The Xpert Xpress SARS-CoV-2/FLU/RSV plus assay is intended as an aid in the diagnosis of influenza from Nasopharyngeal swab specimens and should not be used as a sole basis for treatment. Nasal washings and aspirates are unacceptable for Xpert Xpress SARS-CoV-2/FLU/RSV testing.  Fact Sheet for Patients: EntrepreneurPulse.com.au  Fact Sheet for Healthcare Providers: IncredibleEmployment.be  This test is not yet approved or cleared by the [place] FDA and has been authorized for detection and/or diagnosis of SARS-CoV-2 by FDA under an Emergency Use Authorization (EUA). This EUA will remain in effect (meaning this test can be used) for the duration of the COVID-19 declaration under Section 564(b)(1) of the Act, 21 U.S.C. section 360bbb-3(b)(1), unless the authorization is terminated or revoked.  Performed at [facility], [place] [address], [place], [place] [address]   Blood Culture ID Panel (Reflexed)     Status: Abnormal   Collection Time: 05/15/21  5:10 PM  Result Value Ref Range Status   Enterococcus faecalis NOT  DETECTED NOT DETECTED Final   Enterococcus Faecium NOT DETECTED NOT DETECTED Final   Listeria monocytogenes NOT DETECTED NOT DETECTED Final   Staphylococcus species NOT DETECTED NOT DETECTED Final   Staphylococcus aureus (BCID) NOT DETECTED NOT DETECTED Final   Staphylococcus epidermidis NOT DETECTED NOT DETECTED Final   Staphylococcus lugdunensis NOT DETECTED NOT DETECTED Final   Streptococcus species NOT DETECTED NOT DETECTED Final   Streptococcus agalactiae NOT DETECTED NOT DETECTED Final   Streptococcus pneumoniae NOT DETECTED NOT DETECTED Final   Streptococcus pyogenes NOT DETECTED NOT DETECTED Final   A.calcoaceticus-baumannii NOT DETECTED NOT DETECTED Final   Bacteroides fragilis NOT DETECTED NOT DETECTED Final   Enterobacterales DETECTED (A) NOT DETECTED Final    Comment: Enterobacterales represent a large order of gram negative bacteria, not a single organism. CRITICAL RESULT CALLED TO, READ BACK BY AND VERIFIED WITH: PHARMD J.GADHIA AT [id number] ON 05/16/2021 BY T.SAAD.    Enterobacter cloacae complex NOT DETECTED NOT DETECTED Final   Escherichia coli NOT DETECTED NOT DETECTED Final   Klebsiella aerogenes NOT DETECTED NOT DETECTED Final   Klebsiella oxytoca NOT DETECTED NOT DETECTED Final   Klebsiella pneumoniae DETECTED (A) NOT DETECTED Final    Comment: CRITICAL RESULT CALLED TO, READ BACK BY AND VERIFIED WITH: PHARMD J.GADHIA AT 1259 ON 05/16/2021 BY T.SAAD.    Proteus species NOT DETECTED NOT DETECTED Final   Salmonella species NOT DETECTED NOT DETECTED Final   Serratia marcescens NOT DETECTED NOT DETECTED Final   Haemophilus influenzae NOT DETECTED NOT DETECTED Final   Neisseria meningitidis NOT DETECTED NOT DETECTED Final   Pseudomonas aeruginosa NOT DETECTED NOT DETECTED Final   Stenotrophomonas maltophilia NOT DETECTED NOT DETECTED Final   Candida albicans NOT DETECTED NOT DETECTED Final   Candida auris NOT DETECTED NOT DETECTED Final   Candida glabrata NOT DETECTED  NOT DETECTED Final   Candida krusei NOT DETECTED NOT DETECTED Final   Candida parapsilosis NOT DETECTED NOT DETECTED Final   Candida  tropicalis NOT DETECTED NOT DETECTED Final   Cryptococcus neoformans/gattii NOT DETECTED NOT DETECTED Final   CTX-M ESBL NOT DETECTED NOT DETECTED Final   Carbapenem resistance IMP NOT DETECTED NOT DETECTED Final   Carbapenem resistance KPC NOT DETECTED NOT DETECTED Final   Carbapenem resistance NDM NOT DETECTED NOT DETECTED Final   Carbapenem resist OXA 48 LIKE NOT DETECTED NOT DETECTED Final   Carbapenem resistance VIM NOT DETECTED NOT DETECTED Final    Comment: Performed at [facility], [place] [address], [place], [place] [address]  Culture, blood (routine x 2)     Status: None (Preliminary result)   Collection Time: 05/17/21 11:50 AM   Specimen: BLOOD  Result Value Ref Range Status   Specimen Description   Final    BLOOD RIGHT ANTECUBITAL Performed at [facility] [address], [place], [place] [address]    Special Requests   Final    BOTTLES DRAWN AEROBIC AND ANAEROBIC BCLV Performed at [facility] [address], [place], [place] [address]    Culture   Final    NO GROWTH 4 DAYS Performed at [facility], [place] [address], [place], [place] [address]    Report Status PENDING  Incomplete  Culture, blood (routine x 2)     Status: None (Preliminary result)   Collection Time: 05/17/21 11:50 AM   Specimen: BLOOD  Result Value Ref Range Status   Specimen Description   Final    BLOOD RT WRIST Performed at [facility] [address] [place], [place] [address]    Special Requests   Final    BOTTLES DRAWN AEROBIC AND ANAEROBIC Blood Culture results may not be optimal due to an inadequate volume of blood received in culture bottles Performed at [facility] [address] [place], [place] [address]    Culture   Final    NO GROWTH 4 DAYS Performed at [facility], [place] [address] [place], [place] [address]    Report Status PENDING  Incomplete         Radiology Studies: IR NEPHROSTOMY PLACEMENT LEFT  Result Date: 05/19/2021 INDICATION: 70 year old with bilateral hydronephrosis and cystocele. Retrograde right ureter stent was successfully placed on 05/18/2021. Unable to place left ureter stent. Patient presents for left percutaneous nephrostomy tube placement. EXAM: PLACEMENT OF LEFT PERCUTANEOUS NEPHROSTOMY TUBE USING ULTRASOUND AND FLUOROSCOPIC GUIDANCE COMPARISON:  CT 05/17/2021 MEDICATIONS: Inpatient receiving IV antibiotics. ANESTHESIA/SEDATION: Fentanyl 100 mcg IV; Versed 4.0 mg IV, Zofran 4 mg Moderate Sedation Time:  40 minutes The patient was continuously monitored during the procedure by the interventional radiology nurse under my direct supervision. CONTRAST:  20 mL Omnipaque 300-administered into the collecting system(s) FLUOROSCOPY TIME:  Fluoroscopy Time: 3 minutes 48 seconds (40 mGy). COMPLICATIONS: None immediate. PROCEDURE: Informed written consent was obtained from the patient after a thorough discussion of the procedural risks, benefits and alternatives. All questions were addressed. A timeout was performed prior to the initiation of the procedure. Patient was placed prone. The left flank was prepped and draped in sterile fashion. Maximal barrier sterile technique was utilized including caps, mask, sterile gowns, sterile gloves, sterile drape, hand hygiene and skin antiseptic. Ultrasound was used to identify the left kidney. Left flank was anesthetized using 1% lidocaine. Small incision was made. Using ultrasound guidance, a 21 gauge needle was directed into a mildly dilated mid pole calyx. Contrast injection confirmed placement in the calyx and renal collecting system. A 0.018  wire was successfully advanced into the renal pelvis. Transitional dilator set was placed. Super stiff Amplatz wire was advanced into the renal pelvis and a 10  [place] multipurpose drain was advanced over the wire and reconstituted in the renal pelvis. Follow-up contrast injection was performed. Catheter was flushed with saline and attached to a gravity bag. Catheter was sutured to skin with Prolene suture. Fluoroscopic and ultrasound images were taken and saved for documentation. FINDINGS: Mild to moderate left hydronephrosis. Percutaneous access was obtained in a mid pole calyx. Initial contrast injection demonstrated filling of the renal collecting system and immediate extravasation outside of the collecting system. Findings are similar to the recent retrograde images from 05/18/2021. Catheter placed in the renal pelvis and the left renal collecting system was decompressed at the end of procedure. IMPRESSION: 1. Successful placement of left percutaneous nephrostomy tube using ultrasound and fluoroscopic guidance. 2. Persistent contrast extravasation from the left renal collecting system. Findings similar to the retrograde images from 05/18/2021. Electronically Signed   By: Markus Daft M.D.   On: 05/19/2021 17:32        Scheduled Meds:  ALPRAZolam  1 mg Oral QHS   Chlorhexidine Gluconate Cloth  6 each Topical Daily   estradiol  1 mg Oral Daily   ipratropium  1 spray Each Nare Daily   liothyronine  25 mcg Oral Daily   loratadine  10 mg Oral Daily   mometasone-formoterol  2 puff Inhalation BID   montelukast  10 mg Oral QHS   pantoprazole  40 mg Oral Daily   polyethylene glycol  17 g Oral Daily   potassium chloride  40 mEq Oral BID   Continuous Infusions:  meropenem (MERREM) IV       LOS: 5 days    Time spent: 30 minutes    Barb Merino, MD [facility] Pager [phone]

## 2021-05-22 DIAGNOSIS — A419 Sepsis, unspecified organism: Secondary | ICD-10-CM | POA: Diagnosis not present

## 2021-05-22 DIAGNOSIS — N390 Urinary tract infection, site not specified: Secondary | ICD-10-CM | POA: Diagnosis not present

## 2021-05-22 LAB — BASIC METABOLIC PANEL
Anion gap: 9 (ref 5–15)
BUN: 8 mg/dL (ref 8–23)
CO2: 27 mmol/L (ref 22–32)
Calcium: 7.9 mg/dL — ABNORMAL LOW (ref 8.9–10.3)
Chloride: 103 mmol/L (ref 98–111)
Creatinine, Ser: 0.69 mg/dL (ref 0.44–1.00)
GFR, Estimated: >60 mL/min (ref >60)
Glucose, Bld: 102 mg/dL — ABNORMAL HIGH (ref 70–99)
Potassium: 3.7 mmol/L (ref 3.5–5.1)
Sodium: 139 mmol/L (ref 135–145)

## 2021-05-22 LAB — CBC WITH DIFFERENTIAL/PLATELET
Abs Immature Granulocytes: 0.57 10*3/uL — ABNORMAL HIGH (ref 0.00–0.07)
Basophils Absolute: 0.1 10*3/uL (ref 0.0–0.1)
Basophils Relative: 1 %
Eosinophils Absolute: 0.3 10*3/uL (ref 0.0–0.5)
Eosinophils Relative: 2 %
HCT: 28.8 % — ABNORMAL LOW (ref 36.0–46.0)
Hemoglobin: 9.1 g/dL — ABNORMAL LOW (ref 12.0–15.0)
Immature Granulocytes: 4 %
Lymphocytes Relative: 13 %
Lymphs Abs: 1.9 10*3/uL (ref 0.7–4.0)
MCH: 28.1 pg (ref 26.0–34.0)
MCHC: 31.6 g/dL (ref 30.0–36.0)
MCV: 88.9 fL (ref 80.0–100.0)
Monocytes Absolute: 0.9 10*3/uL (ref 0.1–1.0)
Monocytes Relative: 6 %
Neutro Abs: 11.2 10*3/uL — ABNORMAL HIGH (ref 1.7–7.7)
Neutrophils Relative %: 74 %
Platelets: 618 10*3/uL — ABNORMAL HIGH (ref 150–400)
RBC: 3.24 MIL/uL — ABNORMAL LOW (ref 3.87–5.11)
RDW: 14.8 % (ref 11.5–15.5)
WBC: 14.9 10*3/uL — ABNORMAL HIGH (ref 4.0–10.5)
nRBC: 0 % (ref 0.0–0.2)

## 2021-05-22 NOTE — Progress Notes (Signed)
PROGRESS NOTE    Theresa Holloway  [id number] DOB: [birth year] DOA: 05/15/2021 PCP: Sharilyn Sites, MD    Brief Narrative:  70 year old female with history of COPD, asthma, anxiety, history of partial nephrectomy for left-sided renal cancer presented to the emergency room with fever, dysuria and hematuria.  Patient has been suffering from bladder prolapse and followed up with urology with planned sling surgery on 8/10.  Patient had frequency and dysuria and was given Foley catheter on urology visit on 8/1.  Urine culture taken on 7/28 for preop eval was positive for ESBL E. coli.  Blood cultures on 8/1 positive for Klebsiella.  Urine culture also with Klebsiella on repeat exam. Urine culture from office on 7/28 positive for ESBL E. coli. With persistent elevated white count found to have bilateral severe hydronephrosis. Cystoscopy on 8/4, stenting right ureter.  Left ureter unable to stent.  IR DID PERCUTANEOUS NEPHROSTOMY ON THE LEFT SIDE 8/5.   Outpatient urine culture and sensitivity results with ESBL. Copy and pasted here. Escherichia coli ESBL Antimicrobic/Dose MIC SYSTEMIC URINE --------------------------------------------------------------- Amp/Sulbactam 16/8 I I Amikacin <16 S S Ampicillin >16 R* R* Amox/K Clav <8/4 S S Aztreonam >16 ESBL ESBL Ceftriaxone >32 ESBL ESBL Ceftazidime >16 ESBL ESBL Cefotaxime >32 ESBL ESBL Cefazolin >16 R* R* Ciprofloxacin >2 R R Cefepime >16 R* R* Cefotetan <16 S S Ertapenem <0.5 S S Nitrofurantoin <32 S Gentamicin <4 S S Levofloxacin >4 R R Meropenem <1 S S Pip/Tazo <16 S S Trimeth/Sulfa <2/38 S S Tetracycline <4 S S Tobramycin <4 S S  Assessment & Plan:   Principal Problem:   Sepsis secondary to UTI [facility]) Active Problems:   Sepsis ([place])   Hypokalemia   AKI (acute kidney injury) ([place])   Bladder prolapse, female, acquired  Sepsis secondary to Klebsiella bacteremia/Klebsiella UTI: Recent ESBL E. coli. Treated with different  antibiotics.  Urine culture sensitivity available from the office with ESBL. Currently on meropenem. Treatment for ESBL day 4/10. Severe bilateral hydronephrosis with prolapsed bladder Status post cystoscopy, right-sided stenting of the ureter.  Unable to stent left side. IR did percutaneous nephrostomy 8/5.  May need internalization of the stent.  Followed by urology.Patient has prolapsed bladder and is planned for sling procedure, hopefully after improvement of acute infection. Urology anticipating internalization of left stent before discharge.  Acute kidney injury: Due to #1.  Continue IV fluid resuscitation.  Renal function stabilizing.  Continue Foley catheter care and nephrostomy care.  Hypokalemia/hypomagnesemia: Replaced with improvement .  Hypothyroidism: Euthyroid on replacement.  Continue.  Start mobilizing.  Work with physical therapy. May stay in the hospital for anticipated more urological procedure.  If going home, will need IV antibiotics.  No oral options available.   DVT prophylaxis: Place and maintain sequential compression device Start: 05/18/21 1146 SCD   Code Status: Full code Family Communication: Patient's friend at the bedside. Disposition Plan: Status is: Inpatient  Remains inpatient appropriate because:Inpatient level of care appropriate due to severity of illness  Dispo: The patient is from: Home              Anticipated d/c is to: Home              Patient currently is not medically stable to d/c.   Difficult to place patient No         Consultants:  Urology Interventional radiology  Procedures:  None  Antimicrobials:  Ancef 8/1--- 8/4 Gentamicin 8/4 Meropenem 8/5----   Subjective: Patient seen and examined.  Denies  any complaints.  She had 1 bowel movement early morning and was satisfied.  Remains afebrile.  Does have some back pain but no other pain. Advised to mobilize in the hallway, patient tells me that he cannot walk with  prolapsed internal organs.  Complains of bleeding from the contact surfaces of prolapsed tissue.  Objective: Vitals:   05/21/21 2055 05/21/21 2113 05/22/21 0458 05/22/21 0912  BP: 122/66  123/60   Pulse: 91  89   Resp: 16  20   Temp: 98.8 F (37.1 C)  99.1 F (37.3 C)   TempSrc: Oral  Oral   SpO2: 99% 91% 92% 94%  Weight:      Height:        Intake/Output Summary (Last 24 hours) at 05/22/2021 1307 Last data filed at 05/22/2021 0827 Gross per 24 hour  Intake 320 ml  Output 4200 ml  Net -3880 ml   Filed Weights   05/15/21 1617 05/18/21 1544  Weight: 56.9 kg 56.9 kg    Examination:  General: Looks fairly comfortable.  Anxious at times. Cardiovascular: S1-S2 normal.  No added sounds. Respiratory: Bilateral clear.  No added sounds. Gastrointestinal: Soft and nontender.  Bowel sounds present. Ext: No swelling or edema.  No cyanosis. Neuro: Alert oriented x4.  No focal deficits. Left nephrostomy tube intact and dry. Foley catheter with clear urine.     Data Reviewed: I have personally reviewed following labs and imaging studies  CBC: Recent Labs  Lab 05/18/21 0525 05/19/21 0509 05/20/21 0557 05/21/21 0530 05/22/21 0438  WBC 18.3* 30.2* 19.1* 15.8* 14.9*  NEUTROABS 15.9* 27.4* 15.4* 12.1* 11.2*  HGB 8.4* 10.1* 9.6* 8.9* 9.1*  HCT 26.1* 31.4* 30.4* 28.2* 28.8*  MCV 89.1 87.7 87.4 86.5 88.9  PLT 512* 700* 758* 660* 0000000*   Basic Metabolic Panel: Recent Labs  Lab 05/17/21 0456 05/18/21 0525 05/19/21 0509 05/20/21 0557 05/21/21 0530 05/22/21 0438  NA 132* 131* 138 135 138 139  K 3.5 3.5 3.9 3.0* 3.1* 3.7  CL 103 106 110 106 106 103  CO2 19* 18* 16* 21* 24 27  GLUCOSE 115* 109* 134* 96 105* 102*  BUN '17 17 19 16 9 8  '$ CREATININE 1.60* 2.11* 1.88* 1.21* 0.77 0.69  CALCIUM 7.7* 7.5* 8.5* 8.1* 8.1* 7.9*  MG 1.5* 1.9 1.9  --   --   --   PHOS  --  2.9 3.8  --   --   --    GFR: Estimated Creatinine Clearance: 60.5 mL/min (by C-G formula based on SCr of 0.69  mg/dL). Liver Function Tests: Recent Labs  Lab 05/15/21 1709  AST 16  ALT 17  ALKPHOS 106  BILITOT 0.6  PROT 7.1  ALBUMIN 2.9*   No results for input(s): LIPASE, AMYLASE in the last 168 hours. No results for input(s): AMMONIA in the last 168 hours. Coagulation Profile: Recent Labs  Lab 05/15/21 1709 05/18/21 1945  INR 1.0 1.2   Cardiac Enzymes: No results for input(s): CKTOTAL, CKMB, CKMBINDEX, TROPONINI in the last 168 hours. BNP (last 3 results) No results for input(s): PROBNP in the last 8760 hours. HbA1C: No results for input(s): HGBA1C in the last 72 hours. CBG: No results for input(s): GLUCAP in the last 168 hours. Lipid Profile: No results for input(s): CHOL, HDL, LDLCALC, TRIG, CHOLHDL, LDLDIRECT in the last 72 hours. Thyroid Function Tests: No results for input(s): TSH, T4TOTAL, FREET4, T3FREE, THYROIDAB in the last 72 hours. Anemia Panel: No results for input(s): VITAMINB12, FOLATE, FERRITIN, TIBC,  IRON, RETICCTPCT in the last 72 hours. Sepsis Labs: Recent Labs  Lab 05/15/21 1709 05/16/21 0814  LATICACIDVEN 1.1 1.2    Recent Results (from the past 240 hour(s))  Blood culture (routine single)     Status: Abnormal   Collection Time: 05/15/21  5:10 PM   Specimen: BLOOD  Result Value Ref Range Status   Specimen Description   Final    BLOOD RIGHT ANTECUBITAL Performed at [facility] [address], [place], [place] [address]    Special Requests   Final    BOTTLES DRAWN AEROBIC AND ANAEROBIC Blood Culture results may not be optimal due to an excessive volume of blood received in culture bottles Performed at [facility] [address] [place], [place] [address]    Culture  Setup Time   Final    GRAM NEGATIVE RODS ANAEROBIC BOTTLE ONLY CRITICAL RESULT CALLED TO, READ BACK BY AND VERIFIED WITH: PHARMD J.GADHIA AT [id number] ON 05/16/2021 BY T.SAAD. Performed at [facility], [place] [address] [place], [place]  [address]    Culture KLEBSIELLA PNEUMONIAE (A)  Final   Report Status 05/18/2021 FINAL  Final   Organism ID, Bacteria KLEBSIELLA PNEUMONIAE  Final      Susceptibility   Klebsiella pneumoniae - MIC*    AMPICILLIN >=32 RESISTANT Resistant     CEFAZOLIN <=4 SENSITIVE Sensitive     CEFEPIME <=0.12 SENSITIVE Sensitive     CEFTAZIDIME <=1 SENSITIVE Sensitive     CEFTRIAXONE <=0.25 SENSITIVE Sensitive     CIPROFLOXACIN <=0.25 SENSITIVE Sensitive     GENTAMICIN <=1 SENSITIVE Sensitive     IMIPENEM <=0.25 SENSITIVE Sensitive     TRIMETH/SULFA <=20 SENSITIVE Sensitive     AMPICILLIN/SULBACTAM 4 SENSITIVE Sensitive     PIP/TAZO <=4 SENSITIVE Sensitive     * KLEBSIELLA PNEUMONIAE  Urine Culture     Status: Abnormal   Collection Time: 05/15/21  5:10 PM   Specimen: In/Out Cath Urine  Result Value Ref Range Status   Specimen Description   Final    IN/OUT CATH URINE Performed at [facility] [address] [place], [place] [address]    Special Requests   Final    NONE Performed at [facility], [place] [address], [place], [place] [address]    Culture >=100,000 COLONIES/mL KLEBSIELLA PNEUMONIAE (A)  Final   Report Status 05/18/2021 FINAL  Final   Organism ID, Bacteria KLEBSIELLA PNEUMONIAE (A)  Final      Susceptibility   Klebsiella pneumoniae - MIC*    AMPICILLIN >=32 RESISTANT Resistant     CEFAZOLIN <=4 SENSITIVE Sensitive     CEFEPIME <=0.12 SENSITIVE Sensitive     CEFTRIAXONE <=0.25 SENSITIVE Sensitive     CIPROFLOXACIN <=0.25 SENSITIVE Sensitive     GENTAMICIN <=1 SENSITIVE Sensitive     IMIPENEM <=0.25 SENSITIVE Sensitive     NITROFURANTOIN 64 INTERMEDIATE Intermediate     TRIMETH/SULFA <=20 SENSITIVE Sensitive     AMPICILLIN/SULBACTAM 8 SENSITIVE Sensitive     PIP/TAZO <=4 SENSITIVE Sensitive     * >=100,000 COLONIES/mL KLEBSIELLA PNEUMONIAE  Resp Panel by RT-PCR (Flu A&B, Covid)     Status: None   Collection Time: 05/15/21  5:10 PM    Specimen: Nasopharyngeal(NP) swabs in vial transport medium  Result Value Ref Range Status   SARS Coronavirus 2 by RT PCR NEGATIVE NEGATIVE Final    Comment: (NOTE) SARS-CoV-2 target nucleic acids are NOT DETECTED.  The SARS-CoV-2 RNA is generally  detectable in upper respiratory specimens during the acute phase of infection. The lowest concentration of SARS-CoV-2 viral copies this assay can detect is 138 copies/mL. A negative result does not preclude SARS-Cov-2 infection and should not be used as the sole basis for treatment or other patient management decisions. A negative result may occur with  improper specimen collection/handling, submission of specimen other than nasopharyngeal swab, presence of viral mutation(s) within the areas targeted by this assay, and inadequate number of viral copies(<138 copies/mL). A negative result must be combined with clinical observations, patient history, and epidemiological information. The expected result is Negative.  Fact Sheet for Patients:  EntrepreneurPulse.com.au  Fact Sheet for Healthcare Providers:  IncredibleEmployment.be  This test is no t yet approved or cleared by the [place] FDA and  has been authorized for detection and/or diagnosis of SARS-CoV-2 by FDA under an Emergency Use Authorization (EUA). This EUA will remain  in effect (meaning this test can be used) for the duration of the COVID-19 declaration under Section 564(b)(1) of the Act, 21 U.S.C.section 360bbb-3(b)(1), unless the authorization is terminated  or revoked sooner.       Influenza A by PCR NEGATIVE NEGATIVE Final   Influenza B by PCR NEGATIVE NEGATIVE Final    Comment: (NOTE) The Xpert Xpress SARS-CoV-2/FLU/RSV plus assay is intended as an aid in the diagnosis of influenza from Nasopharyngeal swab specimens and should not be used as a sole basis for treatment. Nasal washings and aspirates are unacceptable for Xpert  Xpress SARS-CoV-2/FLU/RSV testing.  Fact Sheet for Patients: EntrepreneurPulse.com.au  Fact Sheet for Healthcare Providers: IncredibleEmployment.be  This test is not yet approved or cleared by the [place] FDA and has been authorized for detection and/or diagnosis of SARS-CoV-2 by FDA under an Emergency Use Authorization (EUA). This EUA will remain in effect (meaning this test can be used) for the duration of the COVID-19 declaration under Section 564(b)(1) of the Act, 21 U.S.C. section 360bbb-3(b)(1), unless the authorization is terminated or revoked.  Performed at [facility], [place] [address] [place], [place] [address]   Blood Culture ID Panel (Reflexed)     Status: Abnormal   Collection Time: 05/15/21  5:10 PM  Result Value Ref Range Status   Enterococcus faecalis NOT DETECTED NOT DETECTED Final   Enterococcus Faecium NOT DETECTED NOT DETECTED Final   Listeria monocytogenes NOT DETECTED NOT DETECTED Final   Staphylococcus species NOT DETECTED NOT DETECTED Final   Staphylococcus aureus (BCID) NOT DETECTED NOT DETECTED Final   Staphylococcus epidermidis NOT DETECTED NOT DETECTED Final   Staphylococcus lugdunensis NOT DETECTED NOT DETECTED Final   Streptococcus species NOT DETECTED NOT DETECTED Final   Streptococcus agalactiae NOT DETECTED NOT DETECTED Final   Streptococcus pneumoniae NOT DETECTED NOT DETECTED Final   Streptococcus pyogenes NOT DETECTED NOT DETECTED Final   A.calcoaceticus-baumannii NOT DETECTED NOT DETECTED Final   Bacteroides fragilis NOT DETECTED NOT DETECTED Final   Enterobacterales DETECTED (A) NOT DETECTED Final    Comment: Enterobacterales represent a large order of gram negative bacteria, not a single organism. CRITICAL RESULT CALLED TO, READ BACK BY AND VERIFIED WITH: PHARMD J.GADHIA AT [id number] ON 05/16/2021 BY T.SAAD.    Enterobacter cloacae complex NOT DETECTED NOT DETECTED Final    Escherichia coli NOT DETECTED NOT DETECTED Final   Klebsiella aerogenes NOT DETECTED NOT DETECTED Final   Klebsiella oxytoca NOT DETECTED NOT DETECTED Final   Klebsiella pneumoniae DETECTED (A) NOT DETECTED Final    Comment: CRITICAL RESULT CALLED TO, READ BACK  BY AND VERIFIED WITH: PHARMD J.GADHIA AT [id number] ON 05/16/2021 BY T.SAAD.    Proteus species NOT DETECTED NOT DETECTED Final   Salmonella species NOT DETECTED NOT DETECTED Final   Serratia marcescens NOT DETECTED NOT DETECTED Final   Haemophilus influenzae NOT DETECTED NOT DETECTED Final   Neisseria meningitidis NOT DETECTED NOT DETECTED Final   Pseudomonas aeruginosa NOT DETECTED NOT DETECTED Final   Stenotrophomonas maltophilia NOT DETECTED NOT DETECTED Final   Candida albicans NOT DETECTED NOT DETECTED Final   Candida auris NOT DETECTED NOT DETECTED Final   Candida glabrata NOT DETECTED NOT DETECTED Final   Candida krusei NOT DETECTED NOT DETECTED Final   Candida parapsilosis NOT DETECTED NOT DETECTED Final   Candida tropicalis NOT DETECTED NOT DETECTED Final   Cryptococcus neoformans/gattii NOT DETECTED NOT DETECTED Final   CTX-M ESBL NOT DETECTED NOT DETECTED Final   Carbapenem resistance IMP NOT DETECTED NOT DETECTED Final   Carbapenem resistance KPC NOT DETECTED NOT DETECTED Final   Carbapenem resistance NDM NOT DETECTED NOT DETECTED Final   Carbapenem resist OXA 48 LIKE NOT DETECTED NOT DETECTED Final   Carbapenem resistance VIM NOT DETECTED NOT DETECTED Final    Comment: Performed at [facility], [address] [place], [place] [address]  Culture, blood (routine x 2)     Status: None (Preliminary result)   Collection Time: 05/17/21 11:50 AM   Specimen: BLOOD  Result Value Ref Range Status   Specimen Description   Final    BLOOD RIGHT ANTECUBITAL Performed at [facility] [address], [place], [place] [address]    Special Requests   Final    BOTTLES DRAWN AEROBIC AND ANAEROBIC  BCLV Performed at [facility] [address], [place], [place] [address]    Culture   Final    NO GROWTH 4 DAYS Performed at [facility], [place] [address] [place], [place] [address]    Report Status PENDING  Incomplete  Culture, blood (routine x 2)     Status: None (Preliminary result)   Collection Time: 05/17/21 11:50 AM   Specimen: BLOOD  Result Value Ref Range Status   Specimen Description   Final    BLOOD RT WRIST Performed at [facility] [address], [place], [place] [address]    Special Requests   Final    BOTTLES DRAWN AEROBIC AND ANAEROBIC Blood Culture results may not be optimal due to an inadequate volume of blood received in culture bottles Performed at [facility] [address], [place], [place] [address]    Culture   Final    NO GROWTH 4 DAYS Performed at [facility], [place] [address], [place], [place] [address]    Report Status PENDING  Incomplete         Radiology Studies: No results found.      Scheduled Meds:  ALPRAZolam  1 mg Oral QHS   Chlorhexidine Gluconate Cloth  6 each Topical Daily   estradiol  1 mg Oral Daily   ipratropium  1 spray Each Nare Daily   liothyronine  25 mcg Oral Daily   loratadine  10 mg Oral Daily   mometasone-formoterol  2 puff Inhalation BID   montelukast  10 mg Oral QHS   pantoprazole  40 mg Oral Daily   polyethylene glycol  17 g Oral Daily   Continuous Infusions:  meropenem (MERREM) IV 1 g (05/22/21 1008)     LOS: 6  days    Time spent: 30 minutes    Barb Merino, MD [facility] Pager [phone]

## 2021-05-22 NOTE — Progress Notes (Signed)
Patient ID: Theresa Holloway, female   DOB: 19-Oct-1950, 70 y.o.   MRN: [id number]    Supervising Physician: Ruthann Cancer  Patient Status:  [facility] - In-pt  Chief Complaint:  Left hydronephrosis Status post left percutaneous nephrostomy placement on 05/19/2021  Subjective:  Patient lying in bed, she is in no distress. Patient friend at bedside.   Allergies: Codeine, Morphine and related, Penicillins, Sulfa antibiotics, Other, Prednisone, and Skyrizi [risankizumab]  Medications: Prior to Admission medications   Medication Sig Start Date End Date Taking? Authorizing Provider  acetaminophen (TYLENOL) 500 MG tablet Take 1,000 mg by mouth every 6 (six) hours as needed for moderate pain.   Yes [provider]  acyclovir (ZOVIRAX) 800 MG tablet Take 800 mg by mouth 5 (five) times daily as needed (shingles). 04/11/21  Yes [provider]  ALPRAZolam Duanne Moron) 1 MG tablet Take 1 mg by mouth at bedtime.  01/20/15  Yes [provider]  azithromycin (ZITHROMAX) 250 MG tablet Take 250 mg by mouth See admin instructions. Zpack 05/13/21  Yes [provider]  cetirizine (ZYRTEC) 10 MG tablet Take 10 mg by mouth daily.   Yes [provider]  estradiol (ESTRACE) 1 MG tablet Take 1 tablet (1 mg total) by mouth daily. 04/06/21  Yes Estill Dooms, NP  fluticasone (FLONASE) 50 MCG/ACT nasal spray Place 1 spray into both nostrils in the morning and at bedtime. 03/06/21  Yes [provider]  ipratropium (ATROVENT) 0.03 % nasal spray Place 1 spray into both nostrils daily. 05/11/21  Yes [provider]  liothyronine (CYTOMEL) 25 MCG tablet Take 25 mcg by mouth daily. 01/08/21  Yes [provider]  meclizine (ANTIVERT) 25 MG tablet Take 25 mg by mouth 3 (three) times daily as needed for dizziness.   Yes [provider]  montelukast (SINGULAIR) 10 MG tablet Take 10 mg by mouth at bedtime. 02/07/21  Yes [provider]  OTEZLA 30 MG  TABS TAKE 1 TABLET BY MOUTH 2 TIMES A DAY. Patient taking differently: Take 30 mg by mouth in the morning and at bedtime. 01/24/21  Yes Lavonna Monarch, MD  pantoprazole (PROTONIX) 40 MG tablet Take 40 mg by mouth daily.   Yes [provider]  Probiotic Product (TRUBIOTICS) CAPS Take 1 capsule by mouth daily.   Yes [provider]  RESTASIS 0.05 % ophthalmic emulsion Place 1 drop into both eyes 2 (two) times daily. 11/26/20  Yes [provider]  SYMBICORT 160-4.5 MCG/ACT inhaler Inhale 2 puffs into the lungs 2 (two) times daily as needed (wheezing). 01/27/21  Yes [provider]  Apremilast (OTEZLA) 10 & 20 & 30 MG TBPK Take as directed per package instructions. Patient not taking: No sig reported 06/08/20   Lavonna Monarch, MD     Vital Signs: BP 123/60 (BP Location: Right Arm)   Pulse 89   Temp 99.1 F (37.3 C) (Oral)   Resp 20   Ht '5\' 6"'$  (1.676 m)   Wt 125 lb 7.1 oz (56.9 kg)   SpO2 94%   BMI 20.25 kg/m   Physical Exam Constitutional:      Appearance: Normal appearance.  Neurological:     Mental Status: She is alert.  Psychiatric:        Mood and Affect: Mood normal.        Behavior: Behavior normal.  Left PCN drain intact.  Site clean and dry with no erythema or bleeding.  Dressing is clean and dry. Approximately 125 cc blood-tinged  fluid in bag.  Drain flushes easily and draining to gravity.   Imaging: DG C-Arm 1-60 Min-No Report  Result Date: 05/18/2021 Fluoroscopy was utilized by the requesting physician.  No radiographic interpretation.   IR NEPHROSTOMY PLACEMENT LEFT  Result Date: 05/19/2021 INDICATION: 70 year old with bilateral hydronephrosis and cystocele. Retrograde right ureter stent was successfully placed on 05/18/2021. Unable to place left ureter stent. Patient presents for left percutaneous nephrostomy tube placement. EXAM: PLACEMENT OF LEFT PERCUTANEOUS NEPHROSTOMY TUBE USING ULTRASOUND AND FLUOROSCOPIC GUIDANCE COMPARISON:  CT  05/17/2021 MEDICATIONS: Inpatient receiving IV antibiotics. ANESTHESIA/SEDATION: Fentanyl 100 mcg IV; Versed 4.0 mg IV, Zofran 4 mg Moderate Sedation Time:  40 minutes The patient was continuously monitored during the procedure by the interventional radiology nurse under my direct supervision. CONTRAST:  20 mL Omnipaque 300-administered into the collecting system(s) FLUOROSCOPY TIME:  Fluoroscopy Time: 3 minutes 48 seconds (40 mGy). COMPLICATIONS: None immediate. PROCEDURE: Informed written consent was obtained from the patient after a thorough discussion of the procedural risks, benefits and alternatives. All questions were addressed. A timeout was performed prior to the initiation of the procedure. Patient was placed prone. The left flank was prepped and draped in sterile fashion. Maximal barrier sterile technique was utilized including caps, mask, sterile gowns, sterile gloves, sterile drape, hand hygiene and skin antiseptic. Ultrasound was used to identify the left kidney. Left flank was anesthetized using 1% lidocaine. Small incision was made. Using ultrasound guidance, a 21 gauge needle was directed into a mildly dilated mid pole calyx. Contrast injection confirmed placement in the calyx and renal collecting system. A 0.018 wire was successfully advanced into the renal pelvis. Transitional dilator set was placed. Super stiff Amplatz wire was advanced into the renal pelvis and a 10 [place] multipurpose drain was advanced over the wire and reconstituted in the renal pelvis. Follow-up contrast injection was performed. Catheter was flushed with saline and attached to a gravity bag. Catheter was sutured to skin with Prolene suture. Fluoroscopic and ultrasound images were taken and saved for documentation. FINDINGS: Mild to moderate left hydronephrosis. Percutaneous access was obtained in a mid pole calyx. Initial contrast injection demonstrated filling of the renal collecting system and immediate extravasation  outside of the collecting system. Findings are similar to the recent retrograde images from 05/18/2021. Catheter placed in the renal pelvis and the left renal collecting system was decompressed at the end of procedure. IMPRESSION: 1. Successful placement of left percutaneous nephrostomy tube using ultrasound and fluoroscopic guidance. 2. Persistent contrast extravasation from the left renal collecting system. Findings similar to the retrograde images from 05/18/2021. Electronically Signed   By: Markus Daft M.D.   On: 05/19/2021 17:32    Labs:  CBC: Recent Labs    05/19/21 0509 05/20/21 0557 05/21/21 0530 05/22/21 0438  WBC 30.2* 19.1* 15.8* 14.9*  HGB 10.1* 9.6* 8.9* 9.1*  HCT 31.4* 30.4* 28.2* 28.8*  PLT 700* 758* 660* 618*    COAGS: Recent Labs    05/15/21 1709 05/18/21 1945  INR 1.0 1.2  APTT 44*  --     BMP: Recent Labs    05/19/21 0509 05/20/21 0557 05/21/21 0530 05/22/21 0438  NA 138 135 138 139  K 3.9 3.0* 3.1* 3.7  CL 110 106 106 103  CO2 16* 21* 24 27  GLUCOSE 134* 96 105* 102*  BUN '19 16 9 8  '$ CALCIUM 8.5* 8.1* 8.1* 7.9*  CREATININE 1.88* 1.21* 0.77 0.69  GFRNONAA 29* 49* >60 >60    LIVER FUNCTION TESTS: Recent Labs  05/11/21 1010 05/15/21 1709  BILITOT 0.4 0.6  AST 14* 16  ALT 14 17  ALKPHOS 79 106  PROT 6.8 7.1  ALBUMIN 3.0* 2.9*    Assessment and Plan: Pt admitted with L hydronephrosis w/ PCN drain placed 05/19/21.  VSS, temp 99.1. WBC 14.9 from 30.2, BUN 8 from 19, creat 0.69 from 1.88, Hgb 9.1 from 10.1.  IR to follow and monitor drain as needed.  Tentatively scheduled for urological surgery on 05/24/2021.    Electronically Signed: Tyson Alias, NP 05/22/2021, 11:17 AM   I spent a total of 15 Minutes at the the patient's bedside AND on the patient's hospital floor or unit, greater than 50% of which was counseling/coordinating care for L PCN drain.

## 2021-05-22 NOTE — Progress Notes (Signed)
4 Days Post-Op Subjective: Starting to feel normal again, somewhat weaker.  Pain with ambulation related to the catheter.  OTherwise she is without complaints.   Anxious to proceed with surgery.  Objective: Vital signs in last 24 hours: Temp:  [98.1 F (36.7 C)-99.1 F (37.3 C)] 98.1 F (36.7 C) (08/08 1321) Pulse Rate:  [89-93] 93 (08/08 1321) Resp:  [16-20] 16 (08/08 1321) BP: (122-133)/(56-66) 133/56 (08/08 1321) SpO2:  [91 %-99 %] 96 % (08/08 1321)  Intake/Output from previous day: 08/07 0701 - 08/08 0700 In: 536 [P.O.:236; IV Piggyback:300] Out: 4475 [Urine:4475] Intake/Output this shift: Total I/O In: 120 [P.O.:120] Out: 1600 [Urine:1600]  UOP: 2.6L clear  Physical Exam:  General: Alert and oriented CV: RRR Lungs: Clear Abdomen: Soft, ND, NT Ext: NT, No erythema Catheter in bladder, neph tube in left kidney - draining clear yellow urine  Lab Results: Recent Labs    05/20/21 0557 05/21/21 0530 05/22/21 0438  HGB 9.6* 8.9* 9.1*  HCT 30.4* 28.2* 28.8*   BMET Recent Labs    05/21/21 0530 05/22/21 0438  NA 138 139  K 3.1* 3.7  CL 106 103  CO2 24 27  GLUCOSE 105* 102*  BUN 9 8  CREATININE 0.77 0.69  CALCIUM 8.1* 7.9*     Studies/Results: No results found.  Assessment/Plan: Bacteremia from UTI and occluded ureters secondary to severe bladder prolapse.  Things are now improving with appropriate abx and decompression of her ureters (stent on the right/neph tube on the left).  Continues to have severe pain from prolapse, exasperated by foley catheter.  ESBL UTI on IV abx.  Plan to proceed with her surgery as scheduled on Wednesday PM.  She should remain on IV abx until then.  Ok to remove foley catheter and encourage her to ambulate to avoid any further deconditioning prior to surgery.  Would perfer that she stay inhouse until her surgery given her ESBL infection and requirement of IV abx.    LOS: 6 days

## 2021-05-22 NOTE — Progress Notes (Signed)
PT Cancellation Note  Patient Details Name: Theresa Holloway MRN: [id number] DOB: April 22, 1951   Cancelled Treatment:    Reason Eval/Treat Not Completed: Patient declined, patient requesting PT eval post procedure  on 05/24/21. Will hold PT and assess on 8/11 if indicated.  Bari Leib Kell Pager [phone] Office [phone]  05/22/2021, 4:23 PM

## 2021-05-23 DIAGNOSIS — A419 Sepsis, unspecified organism: Secondary | ICD-10-CM | POA: Diagnosis not present

## 2021-05-23 DIAGNOSIS — N390 Urinary tract infection, site not specified: Secondary | ICD-10-CM | POA: Diagnosis not present

## 2021-05-23 LAB — CBC WITH DIFFERENTIAL/PLATELET
Abs Immature Granulocytes: 0.43 10*3/uL — ABNORMAL HIGH (ref 0.00–0.07)
Basophils Absolute: 0.1 10*3/uL (ref 0.0–0.1)
Basophils Relative: 1 %
Eosinophils Absolute: 0.2 10*3/uL (ref 0.0–0.5)
Eosinophils Relative: 2 %
HCT: 29.9 % — ABNORMAL LOW (ref 36.0–46.0)
Hemoglobin: 9.3 g/dL — ABNORMAL LOW (ref 12.0–15.0)
Immature Granulocytes: 4 %
Lymphocytes Relative: 18 %
Lymphs Abs: 2.2 10*3/uL (ref 0.7–4.0)
MCH: 27.4 pg (ref 26.0–34.0)
MCHC: 31.1 g/dL (ref 30.0–36.0)
MCV: 88.2 fL (ref 80.0–100.0)
Monocytes Absolute: 0.8 10*3/uL (ref 0.1–1.0)
Monocytes Relative: 7 %
Neutro Abs: 8.4 10*3/uL — ABNORMAL HIGH (ref 1.7–7.7)
Neutrophils Relative %: 68 %
Platelets: 635 10*3/uL — ABNORMAL HIGH (ref 150–400)
RBC: 3.39 MIL/uL — ABNORMAL LOW (ref 3.87–5.11)
RDW: 14.7 % (ref 11.5–15.5)
WBC: 12.1 10*3/uL — ABNORMAL HIGH (ref 4.0–10.5)
nRBC: 0 % (ref 0.0–0.2)

## 2021-05-23 LAB — CULTURE, BLOOD (ROUTINE X 2)
Culture: NO GROWTH
Culture: NO GROWTH

## 2021-05-23 LAB — BASIC METABOLIC PANEL
Anion gap: 10 (ref 5–15)
BUN: 7 mg/dL — ABNORMAL LOW (ref 8–23)
CO2: 30 mmol/L (ref 22–32)
Calcium: 8.1 mg/dL — ABNORMAL LOW (ref 8.9–10.3)
Chloride: 100 mmol/L (ref 98–111)
Creatinine, Ser: 0.76 mg/dL (ref 0.44–1.00)
GFR, Estimated: >60 mL/min (ref >60)
Glucose, Bld: 99 mg/dL (ref 70–99)
Potassium: 3.2 mmol/L — ABNORMAL LOW (ref 3.5–5.1)
Sodium: 140 mmol/L (ref 135–145)

## 2021-05-23 MED ORDER — CHLORHEXIDINE GLUCONATE 4 % EX LIQD
4.0000 "application " | Freq: Once | CUTANEOUS | Status: AC
  Administered 2021-05-24: 4 via TOPICAL
  Filled 2021-05-23: qty 60

## 2021-05-23 MED ORDER — FLEET ENEMA 7-19 GM/118ML RE ENEM
1.0000 | ENEMA | Freq: Once | RECTAL | Status: AC
  Administered 2021-05-23: 1 via RECTAL
  Filled 2021-05-23: qty 1

## 2021-05-23 MED ORDER — POTASSIUM CHLORIDE CRYS ER 10 MEQ PO TBCR
40.0000 meq | EXTENDED_RELEASE_TABLET | Freq: Two times a day (BID) | ORAL | Status: AC
  Administered 2021-05-23 – 2021-05-24 (×3): 40 meq via ORAL
  Filled 2021-05-23 (×4): qty 4

## 2021-05-23 MED ORDER — FLEET ENEMA 7-19 GM/118ML RE ENEM
1.0000 | ENEMA | Freq: Once | RECTAL | Status: AC
  Administered 2021-05-24: 1 via RECTAL
  Filled 2021-05-23: qty 1

## 2021-05-23 NOTE — Progress Notes (Signed)
Supervising Physician: Daryll Brod  Patient Status:  [facility] - In-pt  Chief Complaint:  Left hydronephrosis Status post left percutaneous nephrostomy placement on 05/19/2021  Subjective:  Pt lying in bed talking on phone. Theresa Holloway is calm, pleasant and cooperative. Pt verbalizes " I'm ready to get my surgery over with". Theresa Holloway is in NAD.  Allergies: Codeine, Morphine and related, Penicillins, Sulfa antibiotics, Other, Prednisone, and Skyrizi [risankizumab]  Medications: Prior to Admission medications   Medication Sig Start Date End Date Taking? Authorizing Provider  acetaminophen (TYLENOL) 500 MG tablet Take 1,000 mg by mouth every 6 (six) hours as needed for moderate pain.   Yes [provider]  acyclovir (ZOVIRAX) 800 MG tablet Take 800 mg by mouth 5 (five) times daily as needed (shingles). 04/11/21  Yes [provider]  ALPRAZolam Duanne Moron) 1 MG tablet Take 1 mg by mouth at bedtime.  01/20/15  Yes [provider]  azithromycin (ZITHROMAX) 250 MG tablet Take 250 mg by mouth See admin instructions. Zpack 05/13/21  Yes [provider]  cetirizine (ZYRTEC) 10 MG tablet Take 10 mg by mouth daily.   Yes [provider]  estradiol (ESTRACE) 1 MG tablet Take 1 tablet (1 mg total) by mouth daily. 04/06/21  Yes Estill Dooms, NP  fluticasone (FLONASE) 50 MCG/ACT nasal spray Place 1 spray into both nostrils in the morning and at bedtime. 03/06/21  Yes [provider]  ipratropium (ATROVENT) 0.03 % nasal spray Place 1 spray into both nostrils daily. 05/11/21  Yes [provider]  liothyronine (CYTOMEL) 25 MCG tablet Take 25 mcg by mouth daily. 01/08/21  Yes [provider]  meclizine (ANTIVERT) 25 MG tablet Take 25 mg by mouth 3 (three) times daily as needed for dizziness.   Yes [provider]  montelukast (SINGULAIR) 10 MG tablet Take 10 mg by mouth at bedtime. 02/07/21  Yes [provider]  OTEZLA 30 MG TABS TAKE  1 TABLET BY MOUTH 2 TIMES A DAY. Patient taking differently: Take 30 mg by mouth in the morning and at bedtime. 01/24/21  Yes Lavonna Monarch, MD  pantoprazole (PROTONIX) 40 MG tablet Take 40 mg by mouth daily.   Yes [provider]  Probiotic Product (TRUBIOTICS) CAPS Take 1 capsule by mouth daily.   Yes [provider]  RESTASIS 0.05 % ophthalmic emulsion Place 1 drop into both eyes 2 (two) times daily. 11/26/20  Yes [provider]  SYMBICORT 160-4.5 MCG/ACT inhaler Inhale 2 puffs into the lungs 2 (two) times daily as needed (wheezing). 01/27/21  Yes [provider]  Apremilast (OTEZLA) 10 & 20 & 30 MG TBPK Take as directed per package instructions. Patient not taking: No sig reported 06/08/20   Lavonna Monarch, MD     Vital Signs: BP (!) 114/53 (BP Location: Right Arm)   Pulse 83   Temp 98.7 F (37.1 C) (Oral)   Resp 16   Ht '5\' 6"'$  (1.676 m)   Wt 125 lb 7.1 oz (56.9 kg)   SpO2 92%   BMI 20.25 kg/m   Physical Exam Constitutional:      Appearance: Normal appearance.  Pulmonary:     Effort: Pulmonary effort is normal.  Skin:    General: Skin is warm and dry.     Comments: L nephrostomy tube in place. Site is C/D/I with no bleeding, erythema, drainage or other s/sx of infection.   Neurological:     Mental Status: Theresa Holloway is alert and oriented to person,  place, and time.  Psychiatric:        Mood and Affect: Mood normal.        Behavior: Behavior normal.        Thought Content: Thought content normal.        Judgment: Judgment normal.    Imaging: IR NEPHROSTOMY PLACEMENT LEFT  Result Date: 05/19/2021 INDICATION: 70 year old with bilateral hydronephrosis and cystocele. Retrograde right ureter stent was successfully placed on 05/18/2021. Unable to place left ureter stent. Patient presents for left percutaneous nephrostomy tube placement. EXAM: PLACEMENT OF LEFT PERCUTANEOUS NEPHROSTOMY TUBE USING ULTRASOUND AND FLUOROSCOPIC GUIDANCE COMPARISON:  CT  05/17/2021 MEDICATIONS: Inpatient receiving IV antibiotics. ANESTHESIA/SEDATION: Fentanyl 100 mcg IV; Versed 4.0 mg IV, Zofran 4 mg Moderate Sedation Time:  40 minutes The patient was continuously monitored during the procedure by the interventional radiology nurse under my direct supervision. CONTRAST:  20 mL Omnipaque 300-administered into the collecting system(s) FLUOROSCOPY TIME:  Fluoroscopy Time: 3 minutes 48 seconds (40 mGy). COMPLICATIONS: None immediate. PROCEDURE: Informed written consent was obtained from the patient after a thorough discussion of the procedural risks, benefits and alternatives. All questions were addressed. A timeout was performed prior to the initiation of the procedure. Patient was placed prone. The left flank was prepped and draped in sterile fashion. Maximal barrier sterile technique was utilized including caps, mask, sterile gowns, sterile gloves, sterile drape, hand hygiene and skin antiseptic. Ultrasound was used to identify the left kidney. Left flank was anesthetized using 1% lidocaine. Small incision was made. Using ultrasound guidance, a 21 gauge needle was directed into a mildly dilated mid pole calyx. Contrast injection confirmed placement in the calyx and renal collecting system. A 0.018 wire was successfully advanced into the renal pelvis. Transitional dilator set was placed. Super stiff Amplatz wire was advanced into the renal pelvis and a 10 [place] multipurpose drain was advanced over the wire and reconstituted in the renal pelvis. Follow-up contrast injection was performed. Catheter was flushed with saline and attached to a gravity bag. Catheter was sutured to skin with Prolene suture. Fluoroscopic and ultrasound images were taken and saved for documentation. FINDINGS: Mild to moderate left hydronephrosis. Percutaneous access was obtained in a mid pole calyx. Initial contrast injection demonstrated filling of the renal collecting system and immediate extravasation  outside of the collecting system. Findings are similar to the recent retrograde images from 05/18/2021. Catheter placed in the renal pelvis and the left renal collecting system was decompressed at the end of procedure. IMPRESSION: 1. Successful placement of left percutaneous nephrostomy tube using ultrasound and fluoroscopic guidance. 2. Persistent contrast extravasation from the left renal collecting system. Findings similar to the retrograde images from 05/18/2021. Electronically Signed   By: Markus Daft M.D.   On: 05/19/2021 17:32    Labs:  CBC: Recent Labs    05/20/21 0557 05/21/21 0530 05/22/21 0438 05/23/21 0453  WBC 19.1* 15.8* 14.9* 12.1*  HGB 9.6* 8.9* 9.1* 9.3*  HCT 30.4* 28.2* 28.8* 29.9*  PLT 758* 660* 618* 635*    COAGS: Recent Labs    05/15/21 1709 05/18/21 1945  INR 1.0 1.2  APTT 44*  --     BMP: Recent Labs    05/20/21 0557 05/21/21 0530 05/22/21 0438 05/23/21 0453  NA 135 138 139 140  K 3.0* 3.1* 3.7 3.2*  CL 106 106 103 100  CO2 21* '24 27 30  '$ GLUCOSE 96 105* 102* 99  BUN '16 9 8 '$ 7*  CALCIUM 8.1* 8.1* 7.9* 8.1*  CREATININE 1.21*  0.77 0.69 0.76  GFRNONAA 49* >60 >60 >60    LIVER FUNCTION TESTS: Recent Labs    05/11/21 1010 05/15/21 1709  BILITOT 0.4 0.6  AST 14* 16  ALT 14 17  ALKPHOS 79 106  PROT 6.8 7.1  ALBUMIN 3.0* 2.9*    Assessment and Plan: Pt admitted with L hydronephrosis w/ PCN drain placed 05/19/21. PCN tube flushes easily with no resistance Scant amt pink tinged urine in bag w/ 750 cc documented in Epic VSS, temp 98.7 WBC 12.1 from 14.9  BUN 7 from 8  Creat 0.76 from 0.69 Hgb 9.3 from 9.1 IR to follow and monitor drain  Tentatively scheduled for urological surgery on 05/24/2021 per note from Dr. Louis Meckel, Urology   Electronically Signed: Tyson Alias, NP 05/23/2021, 12:04 PM   I spent a total of 20 minutes at the the patient's bedside AND on the patient's hospital floor or unit, greater than 50% of which was  counseling/coordinating care for L PCN drain.      Patient ID: Theresa Holloway, female   DOB: 04-Aug-1951, 70 y.o.   MRN: [id number]

## 2021-05-23 NOTE — Progress Notes (Signed)
PROGRESS NOTE    Theresa Holloway  [id number] DOB: October 23, 1950 DOA: 05/15/2021 PCP: Sharilyn Sites, MD    Brief Narrative:  70 year old female with history of COPD, asthma, anxiety, history of partial nephrectomy for left-sided renal cancer presented to the emergency room with fever, dysuria and hematuria.  Patient has been suffering from bladder prolapse and followed up with urology with planned sling surgery on 8/10.  Patient had frequency and dysuria and was given Foley catheter on urology visit on 8/1.  Urine culture taken on 7/28 for preop eval was positive for ESBL E. coli.  Blood cultures on 8/1 positive for Klebsiella.  Urine culture also with Klebsiella on repeat exam. Urine culture from office on 7/28 positive for ESBL E. coli. With persistent elevated white count found to have bilateral severe hydronephrosis. Cystoscopy on 8/4, stenting right ureter.  Left ureter unable to stent.  IR DID PERCUTANEOUS NEPHROSTOMY ON THE LEFT SIDE 8/5.   Outpatient urine culture and sensitivity results with ESBL. Copy and pasted here. Escherichia coli ESBL Antimicrobic/Dose MIC SYSTEMIC URINE --------------------------------------------------------------- Amp/Sulbactam 16/8 I I Amikacin <16 S S Ampicillin >16 R* R* Amox/K Clav <8/4 S S Aztreonam >16 ESBL ESBL Ceftriaxone >32 ESBL ESBL Ceftazidime >16 ESBL ESBL Cefotaxime >32 ESBL ESBL Cefazolin >16 R* R* Ciprofloxacin >2 R R Cefepime >16 R* R* Cefotetan <16 S S Ertapenem <0.5 S S Nitrofurantoin <32 S Gentamicin <4 S S Levofloxacin >4 R R Meropenem <1 S S Pip/Tazo <16 S S Trimeth/Sulfa <2/38 S S Tetracycline <4 S S Tobramycin <4 S S  Assessment & Plan:   Principal Problem:   Sepsis secondary to UTI [facility]) Active Problems:   Sepsis ([place])   Hypokalemia   AKI (acute kidney injury) ([place])   Bladder prolapse, female, acquired  Sepsis secondary to Klebsiella bacteremia/Klebsiella UTI: Recent ESBL E. coli. Treated with different  antibiotics.  Urine culture sensitivity available from the office with ESBL. Currently on meropenem. Treatment for ESBL day 5/10. Severe bilateral hydronephrosis with prolapsed bladder Status post cystoscopy, right-sided stenting of the ureter.  Unable to stent left side. IR did percutaneous nephrostomy 8/5.  May need internalization of the stent.  Followed by urology. Urology anticipating internalization of left stent and repair prolapse 8/10. Discontinue Foley catheter and mobilize. Enema in preparation of surgery as per urology.  Acute kidney injury: Due to #1.  Continue IV fluid resuscitation.  Renal function stabilizing.  Continue nephrostomy care.  Hypokalemia/hypomagnesemia: Replaced with improvement .  Keep on potassium supplement.  Hypothyroidism: Euthyroid on replacement.  Continue.   DVT prophylaxis: Place and maintain sequential compression device Start: 05/18/21 1146 SCD   Code Status: Full code Family Communication: No family at bedside. Disposition Plan: Status is: Inpatient  Remains inpatient appropriate because:Inpatient level of care appropriate due to severity of illness  Dispo: The patient is from: Home              Anticipated d/c is to: Home              Patient currently is not medically stable to d/c.   Difficult to place patient No         Consultants:  Urology Interventional radiology  Procedures:  None  Antimicrobials:  Ancef 8/1--- 8/4 Gentamicin 8/4 Meropenem 8/5----   Subjective: Seen and examined.  No new events.  Looking forward for surgery tomorrow.  Objective: Vitals:   05/22/21 1909 05/22/21 2051 05/23/21 0432 05/23/21 0825  BP:  133/63 (!) 114/53   Pulse:  92  83   Resp:  16 16   Temp:  99.4 F (37.4 C) 98.7 F (37.1 C)   TempSrc:  Oral Oral   SpO2: 92% 94% 94% 92%  Weight:      Height:        Intake/Output Summary (Last 24 hours) at 05/23/2021 1135 Last data filed at 05/23/2021 1100 Gross per 24 hour  Intake 120  ml  Output 4375 ml  Net -4255 ml   Filed Weights   05/15/21 1617 05/18/21 1544  Weight: 56.9 kg 56.9 kg    Examination:  General: Looks fairly comfortable.  Cardiovascular: S1-S2 normal.  No added sounds. Respiratory: Bilateral clear.  No added sounds. Gastrointestinal: Soft and nontender.  Bowel sounds present. Ext: No swelling or edema.  No cyanosis. Neuro: Alert oriented x4.  No focal deficits. Left nephrostomy tube intact and dry. Foley catheter with clear urine.     Data Reviewed: I have personally reviewed following labs and imaging studies  CBC: Recent Labs  Lab 05/19/21 0509 05/20/21 0557 05/21/21 0530 05/22/21 0438 05/23/21 0453  WBC 30.2* 19.1* 15.8* 14.9* 12.1*  NEUTROABS 27.4* 15.4* 12.1* 11.2* 8.4*  HGB 10.1* 9.6* 8.9* 9.1* 9.3*  HCT 31.4* 30.4* 28.2* 28.8* 29.9*  MCV 87.7 87.4 86.5 88.9 88.2  PLT 700* 758* 660* 618* 0000000*   Basic Metabolic Panel: Recent Labs  Lab 05/17/21 0456 05/18/21 0525 05/19/21 0509 05/20/21 0557 05/21/21 0530 05/22/21 0438 05/23/21 0453  NA 132* 131* 138 135 138 139 140  K 3.5 3.5 3.9 3.0* 3.1* 3.7 3.2*  CL 103 106 110 106 106 103 100  CO2 19* 18* 16* 21* '24 27 30  '$ GLUCOSE 115* 109* 134* 96 105* 102* 99  BUN '17 17 19 16 9 8 '$ 7*  CREATININE 1.60* 2.11* 1.88* 1.21* 0.77 0.69 0.76  CALCIUM 7.7* 7.5* 8.5* 8.1* 8.1* 7.9* 8.1*  MG 1.5* 1.9 1.9  --   --   --   --   PHOS  --  2.9 3.8  --   --   --   --    GFR: Estimated Creatinine Clearance: 60.5 mL/min (by C-G formula based on SCr of 0.76 mg/dL). Liver Function Tests: No results for input(s): AST, ALT, ALKPHOS, BILITOT, PROT, ALBUMIN in the last 168 hours.  No results for input(s): LIPASE, AMYLASE in the last 168 hours. No results for input(s): AMMONIA in the last 168 hours. Coagulation Profile: Recent Labs  Lab 05/18/21 1945  INR 1.2   Cardiac Enzymes: No results for input(s): CKTOTAL, CKMB, CKMBINDEX, TROPONINI in the last 168 hours. BNP (last 3 results) No  results for input(s): PROBNP in the last 8760 hours. HbA1C: No results for input(s): HGBA1C in the last 72 hours. CBG: No results for input(s): GLUCAP in the last 168 hours. Lipid Profile: No results for input(s): CHOL, HDL, LDLCALC, TRIG, CHOLHDL, LDLDIRECT in the last 72 hours. Thyroid Function Tests: No results for input(s): TSH, T4TOTAL, FREET4, T3FREE, THYROIDAB in the last 72 hours. Anemia Panel: No results for input(s): VITAMINB12, FOLATE, FERRITIN, TIBC, IRON, RETICCTPCT in the last 72 hours. Sepsis Labs: No results for input(s): PROCALCITON, LATICACIDVEN in the last 168 hours.   Recent Results (from the past 240 hour(s))  Blood culture (routine single)     Status: Abnormal   Collection Time: 05/15/21  5:10 PM   Specimen: BLOOD  Result Value Ref Range Status   Specimen Description   Final    BLOOD RIGHT ANTECUBITAL Performed at [facility],  [place] [address] [place], [place] [address]    Special Requests   Final    BOTTLES DRAWN AEROBIC AND ANAEROBIC Blood Culture results may not be optimal due to an excessive volume of blood received in culture bottles Performed at [facility] [address], [place], [place] [address]    Culture  Setup Time   Final    GRAM NEGATIVE RODS ANAEROBIC BOTTLE ONLY CRITICAL RESULT CALLED TO, READ BACK BY AND VERIFIED WITH: PHARMD J.GADHIA AT [id number] ON 05/16/2021 BY T.SAAD. Performed at [facility], [place] [address], [place], [place] [address]    Culture KLEBSIELLA PNEUMONIAE (A)  Final   Report Status 05/18/2021 FINAL  Final   Organism ID, Bacteria KLEBSIELLA PNEUMONIAE  Final      Susceptibility   Klebsiella pneumoniae - MIC*    AMPICILLIN >=32 RESISTANT Resistant     CEFAZOLIN <=4 SENSITIVE Sensitive     CEFEPIME <=0.12 SENSITIVE Sensitive     CEFTAZIDIME <=1 SENSITIVE Sensitive     CEFTRIAXONE <=0.25 SENSITIVE Sensitive     CIPROFLOXACIN <=0.25 SENSITIVE Sensitive     GENTAMICIN <=1  SENSITIVE Sensitive     IMIPENEM <=0.25 SENSITIVE Sensitive     TRIMETH/SULFA <=20 SENSITIVE Sensitive     AMPICILLIN/SULBACTAM 4 SENSITIVE Sensitive     PIP/TAZO <=4 SENSITIVE Sensitive     * KLEBSIELLA PNEUMONIAE  Urine Culture     Status: Abnormal   Collection Time: 05/15/21  5:10 PM   Specimen: In/Out Cath Urine  Result Value Ref Range Status   Specimen Description   Final    IN/OUT CATH URINE Performed at [facility] [address], [place], [place] [address]    Special Requests   Final    NONE Performed at [facility], [place] [address] [place],  [address]    Culture >=100,000 COLONIES/mL KLEBSIELLA PNEUMONIAE (A)  Final   Report Status 05/18/2021 FINAL  Final   Organism ID, Bacteria KLEBSIELLA PNEUMONIAE (A)  Final      Susceptibility   Klebsiella pneumoniae - MIC*    AMPICILLIN >=32 RESISTANT Resistant     CEFAZOLIN <=4 SENSITIVE Sensitive     CEFEPIME <=0.12 SENSITIVE Sensitive     CEFTRIAXONE <=0.25 SENSITIVE Sensitive     CIPROFLOXACIN <=0.25 SENSITIVE Sensitive     GENTAMICIN <=1 SENSITIVE Sensitive     IMIPENEM <=0.25 SENSITIVE Sensitive     NITROFURANTOIN 64 INTERMEDIATE Intermediate     TRIMETH/SULFA <=20 SENSITIVE Sensitive     AMPICILLIN/SULBACTAM 8 SENSITIVE Sensitive     PIP/TAZO <=4 SENSITIVE Sensitive     * >=100,000 COLONIES/mL KLEBSIELLA PNEUMONIAE  Resp Panel by RT-PCR (Flu A&B, Covid)     Status: None   Collection Time: 05/15/21  5:10 PM   Specimen: Nasopharyngeal(NP) swabs in vial transport medium  Result Value Ref Range Status   SARS Coronavirus 2 by RT PCR NEGATIVE NEGATIVE Final    Comment: (NOTE) SARS-CoV-2 target nucleic acids are NOT DETECTED.  The SARS-CoV-2 RNA is generally detectable in upper respiratory specimens during the acute phase of infection. The lowest concentration of SARS-CoV-2 viral copies this assay can detect is 138 copies/mL. A negative result does not preclude  SARS-Cov-2 infection and should not be used as the sole basis for treatment or other patient management decisions. A negative result may occur with  improper specimen collection/handling, submission of specimen other than nasopharyngeal swab, presence of viral mutation(s) within the areas targeted by this assay, and inadequate  number of viral copies(<138 copies/mL). A negative result must be combined with clinical observations, patient history, and epidemiological information. The expected result is Negative.  Fact Sheet for Patients:  EntrepreneurPulse.com.au  Fact Sheet for Healthcare Providers:  IncredibleEmployment.be  This test is no t yet approved or cleared by the [place] FDA and  has been authorized for detection and/or diagnosis of SARS-CoV-2 by FDA under an Emergency Use Authorization (EUA). This EUA will remain  in effect (meaning this test can be used) for the duration of the COVID-19 declaration under Section 564(b)(1) of the Act, 21 U.S.C.section 360bbb-3(b)(1), unless the authorization is terminated  or revoked sooner.       Influenza A by PCR NEGATIVE NEGATIVE Final   Influenza B by PCR NEGATIVE NEGATIVE Final    Comment: (NOTE) The Xpert Xpress SARS-CoV-2/FLU/RSV plus assay is intended as an aid in the diagnosis of influenza from Nasopharyngeal swab specimens and should not be used as a sole basis for treatment. Nasal washings and aspirates are unacceptable for Xpert Xpress SARS-CoV-2/FLU/RSV testing.  Fact Sheet for Patients: EntrepreneurPulse.com.au  Fact Sheet for Healthcare Providers: IncredibleEmployment.be  This test is not yet approved or cleared by the [place] FDA and has been authorized for detection and/or diagnosis of SARS-CoV-2 by FDA under an Emergency Use Authorization (EUA). This EUA will remain in effect (meaning this test can be used) for the duration of  the COVID-19 declaration under Section 564(b)(1) of the Act, 21 U.S.C. section 360bbb-3(b)(1), unless the authorization is terminated or revoked.  Performed at [facility], [place] [address], [place],  [address]   Blood Culture ID Panel (Reflexed)     Status: Abnormal   Collection Time: 05/15/21  5:10 PM  Result Value Ref Range Status   Enterococcus faecalis NOT DETECTED NOT DETECTED Final   Enterococcus Faecium NOT DETECTED NOT DETECTED Final   Listeria monocytogenes NOT DETECTED NOT DETECTED Final   Staphylococcus species NOT DETECTED NOT DETECTED Final   Staphylococcus aureus (BCID) NOT DETECTED NOT DETECTED Final   Staphylococcus epidermidis NOT DETECTED NOT DETECTED Final   Staphylococcus lugdunensis NOT DETECTED NOT DETECTED Final   Streptococcus species NOT DETECTED NOT DETECTED Final   Streptococcus agalactiae NOT DETECTED NOT DETECTED Final   Streptococcus pneumoniae NOT DETECTED NOT DETECTED Final   Streptococcus pyogenes NOT DETECTED NOT DETECTED Final   A.calcoaceticus-baumannii NOT DETECTED NOT DETECTED Final   Bacteroides fragilis NOT DETECTED NOT DETECTED Final   Enterobacterales DETECTED (A) NOT DETECTED Final    Comment: Enterobacterales represent a large order of gram negative bacteria, not a single organism. CRITICAL RESULT CALLED TO, READ BACK BY AND VERIFIED WITH: PHARMD J.GADHIA AT [id number] ON 05/16/2021 BY T.SAAD.    Enterobacter cloacae complex NOT DETECTED NOT DETECTED Final   Escherichia coli NOT DETECTED NOT DETECTED Final   Klebsiella aerogenes NOT DETECTED NOT DETECTED Final   Klebsiella oxytoca NOT DETECTED NOT DETECTED Final   Klebsiella pneumoniae DETECTED (A) NOT DETECTED Final    Comment: CRITICAL RESULT CALLED TO, READ BACK BY AND VERIFIED WITH: PHARMD J.GADHIA AT 1259 ON 05/16/2021 BY T.SAAD.    Proteus species NOT DETECTED NOT DETECTED Final   Salmonella species NOT DETECTED NOT DETECTED Final   Serratia marcescens NOT  DETECTED NOT DETECTED Final   Haemophilus influenzae NOT DETECTED NOT DETECTED Final   Neisseria meningitidis NOT DETECTED NOT DETECTED Final   Pseudomonas aeruginosa NOT DETECTED NOT DETECTED Final   Stenotrophomonas maltophilia NOT DETECTED NOT DETECTED Final   Candida  albicans NOT DETECTED NOT DETECTED Final   Candida auris NOT DETECTED NOT DETECTED Final   Candida glabrata NOT DETECTED NOT DETECTED Final   Candida krusei NOT DETECTED NOT DETECTED Final   Candida parapsilosis NOT DETECTED NOT DETECTED Final   Candida tropicalis NOT DETECTED NOT DETECTED Final   Cryptococcus neoformans/gattii NOT DETECTED NOT DETECTED Final   CTX-M ESBL NOT DETECTED NOT DETECTED Final   Carbapenem resistance IMP NOT DETECTED NOT DETECTED Final   Carbapenem resistance KPC NOT DETECTED NOT DETECTED Final   Carbapenem resistance NDM NOT DETECTED NOT DETECTED Final   Carbapenem resist OXA 48 LIKE NOT DETECTED NOT DETECTED Final   Carbapenem resistance VIM NOT DETECTED NOT DETECTED Final    Comment: Performed at [facility], [address], [place], [place] [address]  Culture, blood (routine x 2)     Status: None   Collection Time: 05/17/21 11:50 AM   Specimen: BLOOD  Result Value Ref Range Status   Specimen Description   Final    BLOOD RIGHT ANTECUBITAL Performed at [facility] [address], [place], [place] [address]    Special Requests   Final    BOTTLES DRAWN AEROBIC AND ANAEROBIC BCLV Performed at [facility] [address] [place], [place] [address]    Culture   Final    NO GROWTH 6 DAYS Performed at [facility], [place] [address] [place], [place] [address]    Report Status 05/23/2021 FINAL  Final  Culture, blood (routine x 2)     Status: None   Collection Time: 05/17/21 11:50 AM   Specimen: BLOOD  Result Value Ref Range Status   Specimen Description   Final    BLOOD RT WRIST Performed at [facility]  [address] [place], [place] [address]    Special Requests   Final    BOTTLES DRAWN AEROBIC AND ANAEROBIC Blood Culture results may not be optimal due to an inadequate volume of blood received in culture bottles Performed at [facility] [address], [place], [place] [address]    Culture   Final    NO GROWTH 6 DAYS Performed at [facility], [place] [address] [place], [place] [address]    Report Status 05/23/2021 FINAL  Final         Radiology Studies: No results found.      Scheduled Meds:  ALPRAZolam  1 mg Oral QHS   [START ON 05/24/2021] chlorhexidine  4 application Topical Once   Chlorhexidine Gluconate Cloth  6 each Topical Daily   estradiol  1 mg Oral Daily   ipratropium  1 spray Each Nare Daily   liothyronine  25 mcg Oral Daily   loratadine  10 mg Oral Daily   mometasone-formoterol  2 puff Inhalation BID   montelukast  10 mg Oral QHS   pantoprazole  40 mg Oral Daily   polyethylene glycol  17 g Oral Daily   potassium chloride  40 mEq Oral BID   [START ON 05/24/2021] sodium phosphate  1 enema Rectal Once   Continuous Infusions:  meropenem (MERREM) IV 1 g (05/23/21 0955)     LOS: 7 days    Time spent: 30 minutes    Barb Merino, MD [facility] Pager [phone]

## 2021-05-23 NOTE — Progress Notes (Signed)
Patient needs to be on clear liquids passed noon today, NPO passed MN. Ok to remove foley this AM. Will need enema in the AM to prepare for surgery. She will also need chlorhexidine wash on day of surgery. We will plan to internalize her left neph tube at time of prolapse repair tomorrow.  I will amend her surgical consent.

## 2021-05-24 ENCOUNTER — Inpatient Hospital Stay (HOSPITAL_COMMUNITY): Payer: 59 | Admitting: Certified Registered Nurse Anesthetist

## 2021-05-24 ENCOUNTER — Ambulatory Visit (HOSPITAL_COMMUNITY): Admission: RE | Admit: 2021-05-24 | Payer: 59 | Source: Ambulatory Visit | Admitting: Urology

## 2021-05-24 ENCOUNTER — Encounter (HOSPITAL_COMMUNITY)
Admission: EM | Disposition: A | Discharge status: Home / Self Care | Payer: Self-pay | Source: Home / Self Care | Attending: Internal Medicine

## 2021-05-24 ENCOUNTER — Inpatient Hospital Stay (HOSPITAL_COMMUNITY): Payer: 59

## 2021-05-24 HISTORY — PX: CYSTOSCOPY W/ URETERAL STENT PLACEMENT: SHX1429

## 2021-05-24 HISTORY — PX: ROBOTIC ASSISTED LAPAROSCOPIC SACROCOLPOPEXY: SHX5388

## 2021-05-24 LAB — CBC WITH DIFFERENTIAL/PLATELET
Abs Immature Granulocytes: 0.21 10*3/uL — ABNORMAL HIGH (ref 0.00–0.07)
Basophils Absolute: 0.1 10*3/uL (ref 0.0–0.1)
Basophils Relative: 1 %
Eosinophils Absolute: 0.2 10*3/uL (ref 0.0–0.5)
Eosinophils Relative: 2 %
HCT: 28.9 % — ABNORMAL LOW (ref 36.0–46.0)
Hemoglobin: 9 g/dL — ABNORMAL LOW (ref 12.0–15.0)
Immature Granulocytes: 2 %
Lymphocytes Relative: 21 %
Lymphs Abs: 2.3 10*3/uL (ref 0.7–4.0)
MCH: 27.6 pg (ref 26.0–34.0)
MCHC: 31.1 g/dL (ref 30.0–36.0)
MCV: 88.7 fL (ref 80.0–100.0)
Monocytes Absolute: 0.8 10*3/uL (ref 0.1–1.0)
Monocytes Relative: 7 %
Neutro Abs: 7.2 10*3/uL (ref 1.7–7.7)
Neutrophils Relative %: 67 %
Platelets: 554 10*3/uL — ABNORMAL HIGH (ref 150–400)
RBC: 3.26 MIL/uL — ABNORMAL LOW (ref 3.87–5.11)
RDW: 14.8 % (ref 11.5–15.5)
WBC: 10.8 10*3/uL — ABNORMAL HIGH (ref 4.0–10.5)
nRBC: 0 % (ref 0.0–0.2)

## 2021-05-24 LAB — HEMOGLOBIN AND HEMATOCRIT, BLOOD
HCT: 30.1 % — ABNORMAL LOW (ref 36.0–46.0)
Hemoglobin: 9.2 g/dL — ABNORMAL LOW (ref 12.0–15.0)

## 2021-05-24 SURGERY — SACROCOLPOPEXY, ROBOT-ASSISTED, LAPAROSCOPIC
Anesthesia: General

## 2021-05-24 MED ORDER — SUGAMMADEX SODIUM 200 MG/2ML IV SOLN
INTRAVENOUS | Status: DC | PRN
  Administered 2021-05-24: 200 mg via INTRAVENOUS
  Administered 2021-05-24: 100 mg via INTRAVENOUS

## 2021-05-24 MED ORDER — DEXMEDETOMIDINE (PRECEDEX) IN NS 20 MCG/5ML (4 MCG/ML) IV SYRINGE
PREFILLED_SYRINGE | INTRAVENOUS | Status: DC | PRN
  Administered 2021-05-24 (×2): 8 ug via INTRAVENOUS
  Administered 2021-05-24: 4 ug via INTRAVENOUS
  Administered 2021-05-24 (×2): 8 ug via INTRAVENOUS

## 2021-05-24 MED ORDER — HYDROMORPHONE HCL 1 MG/ML IJ SOLN: 0.2500 mg | INTRAMUSCULAR | Status: DC | PRN

## 2021-05-24 MED ORDER — PROPOFOL 10 MG/ML IV BOLUS
INTRAVENOUS | Status: DC | PRN
  Administered 2021-05-24: 120 mg via INTRAVENOUS

## 2021-05-24 MED ORDER — DROPERIDOL 2.5 MG/ML IJ SOLN
INTRAMUSCULAR | Status: DC | PRN
  Administered 2021-05-24: .625 mg via INTRAVENOUS

## 2021-05-24 MED ORDER — ROCURONIUM BROMIDE 10 MG/ML (PF) SYRINGE
PREFILLED_SYRINGE | INTRAVENOUS | Status: DC | PRN
Start: 2021-05-24 — End: 2021-05-24
  Administered 2021-05-24: 60 mg via INTRAVENOUS
  Administered 2021-05-24 (×2): 20 mg via INTRAVENOUS

## 2021-05-24 MED ORDER — LIDOCAINE 2% (20 MG/ML) 5 ML SYRINGE
INTRAMUSCULAR | Status: DC | PRN
  Administered 2021-05-24: 60 mg via INTRAVENOUS

## 2021-05-24 MED ORDER — MIDAZOLAM HCL 5 MG/5ML IJ SOLN
INTRAMUSCULAR | Status: DC | PRN
  Administered 2021-05-24: 2 mg via INTRAVENOUS

## 2021-05-24 MED ORDER — SUGAMMADEX SODIUM 200 MG/2ML IV SOLN: INTRAVENOUS | Status: DC | PRN

## 2021-05-24 MED ORDER — TRAMADOL HCL 50 MG PO TABS: 50.0000 mg | ORAL_TABLET | Freq: Four times a day (QID) | ORAL | 0 refills | Status: DC | PRN

## 2021-05-24 MED ORDER — SODIUM CHLORIDE 0.9 % IV SOLN
INTRAVENOUS | Status: DC | PRN
  Administered 2021-05-24: 10 mL

## 2021-05-24 MED ORDER — LACTATED RINGERS IR SOLN
Status: DC | PRN
  Administered 2021-05-24: 1

## 2021-05-24 MED ORDER — CIPROFLOXACIN HCL 500 MG PO TABS: 500.0000 mg | ORAL_TABLET | Freq: Two times a day (BID) | ORAL | 0 refills | Status: DC

## 2021-05-24 MED ORDER — CHLORHEXIDINE GLUCONATE 0.12 % MT SOLN
15.0000 mL | Freq: Once | OROMUCOSAL | Status: AC
  Administered 2021-05-24: 15 mL via OROMUCOSAL

## 2021-05-24 MED ORDER — ONDANSETRON HCL 4 MG/2ML IJ SOLN
INTRAMUSCULAR | Status: DC | PRN
  Administered 2021-05-24: 4 mg via INTRAVENOUS

## 2021-05-24 MED ORDER — ESTRADIOL 0.1 MG/GM VA CREA
TOPICAL_CREAM | VAGINAL | Status: DC | PRN
  Administered 2021-05-24: 1 via VAGINAL

## 2021-05-24 MED ORDER — PROMETHAZINE HCL 25 MG/ML IJ SOLN
INTRAMUSCULAR | Status: AC
  Filled 2021-05-24: qty 1

## 2021-05-24 MED ORDER — SODIUM CHLORIDE 0.45 % IV SOLN: INTRAVENOUS | Status: DC

## 2021-05-24 MED ORDER — DROPERIDOL 2.5 MG/ML IJ SOLN
INTRAMUSCULAR | Status: AC
  Filled 2021-05-24: qty 2

## 2021-05-24 MED ORDER — DOCUSATE SODIUM 100 MG PO CAPS: 100.0000 mg | ORAL_CAPSULE | Freq: Two times a day (BID) | ORAL | Status: AC

## 2021-05-24 MED ORDER — OXYCODONE HCL 5 MG PO TABS: 5.0000 mg | ORAL_TABLET | Freq: Once | ORAL | Status: DC | PRN

## 2021-05-24 MED ORDER — DIPHENHYDRAMINE HCL 12.5 MG/5ML PO ELIX
12.5000 mg | ORAL_SOLUTION | Freq: Four times a day (QID) | ORAL | Status: DC | PRN
  Filled 2021-05-24: qty 5

## 2021-05-24 MED ORDER — ALBUMIN HUMAN 5 % IV SOLN: INTRAVENOUS | Status: DC | PRN

## 2021-05-24 MED ORDER — BUPIVACAINE-EPINEPHRINE 0.5% -1:200000 IJ SOLN
INTRAMUSCULAR | Status: DC | PRN
  Administered 2021-05-24: 30 mL

## 2021-05-24 MED ORDER — PROMETHAZINE HCL 25 MG/ML IJ SOLN
6.2500 mg | INTRAMUSCULAR | Status: DC | PRN
  Administered 2021-05-24: 6.25 mg via INTRAVENOUS

## 2021-05-24 MED ORDER — OXYCODONE HCL 5 MG/5ML PO SOLN
5.0000 mg | Freq: Once | ORAL | Status: DC | PRN
Start: 2021-05-24 — End: 2021-05-24

## 2021-05-24 MED ORDER — PHENYLEPHRINE HCL-NACL 20-0.9 MG/250ML-% IV SOLN
INTRAVENOUS | Status: DC | PRN
  Administered 2021-05-24: 35 ug/min via INTRAVENOUS

## 2021-05-24 MED ORDER — MEPERIDINE HCL 50 MG/ML IJ SOLN: 6.2500 mg | INTRAMUSCULAR | Status: DC | PRN

## 2021-05-24 MED ORDER — ESTRADIOL 0.1 MG/GM VA CREA
TOPICAL_CREAM | VAGINAL | Status: AC
  Filled 2021-05-24: qty 42.5

## 2021-05-24 MED ORDER — STERILE WATER FOR IRRIGATION IR SOLN
Status: DC | PRN
  Administered 2021-05-24: 1000 mL

## 2021-05-24 MED ORDER — DEXAMETHASONE SODIUM PHOSPHATE 10 MG/ML IJ SOLN
INTRAMUSCULAR | Status: DC | PRN
  Administered 2021-05-24: 10 mg via INTRAVENOUS

## 2021-05-24 MED ORDER — LACTATED RINGERS IV SOLN: INTRAVENOUS | Status: DC

## 2021-05-24 MED ORDER — ORAL CARE MOUTH RINSE: 15.0000 mL | Freq: Once | OROMUCOSAL | Status: AC

## 2021-05-24 MED ORDER — BUPIVACAINE LIPOSOME 1.3 % IJ SUSP
20.0000 mL | Freq: Once | INTRAMUSCULAR | Status: AC
  Administered 2021-05-24: 20 mL
  Filled 2021-05-24: qty 20

## 2021-05-24 MED ORDER — DIPHENHYDRAMINE HCL 50 MG/ML IJ SOLN: 12.5000 mg | Freq: Four times a day (QID) | INTRAMUSCULAR | Status: DC | PRN

## 2021-05-24 MED ORDER — FENTANYL CITRATE (PF) 100 MCG/2ML IJ SOLN
INTRAMUSCULAR | Status: DC | PRN
  Administered 2021-05-24: 100 ug via INTRAVENOUS
  Administered 2021-05-24 (×2): 50 ug via INTRAVENOUS

## 2021-05-24 SURGICAL SUPPLY — 91 items
ADH SKN CLS APL DERMABOND .7 (GAUZE/BANDAGES/DRESSINGS) ×2
APL PRP STRL LF DISP 70% ISPRP (MISCELLANEOUS) ×2
BAG COUNTER SPONGE SURGICOUNT (BAG) IMPLANT
BAG DRN RND TRDRP ANRFLXCHMBR (UROLOGICAL SUPPLIES)
BAG SPEC RTRVL LRG 6X4 10 (ENDOMECHANICALS)
BAG SPNG CNTER NS LX DISP (BAG)
BAG URINE DRAIN 2000ML AR STRL (UROLOGICAL SUPPLIES) IMPLANT
BAG URO CATCHER STRL LF (MISCELLANEOUS) ×3 IMPLANT
BASKET ZERO TIP NITINOL 2.4FR (BASKET) IMPLANT
BSKT STON RTRVL ZERO TP 2.4FR (BASKET)
CATH FOLEY 2WAY SLVR  5CC 16FR (CATHETERS) ×3
CATH FOLEY 2WAY SLVR 5CC 16FR (CATHETERS) ×2 IMPLANT
CATH URET 5FR 28IN OPEN ENDED (CATHETERS) ×3 IMPLANT
CHLORAPREP W/TINT 26 (MISCELLANEOUS) ×3 IMPLANT
CLIP LIGATING HEMO LOK XL GOLD (MISCELLANEOUS) IMPLANT
CLIP VESOLOCK LG 6/CT PURPLE (CLIP) ×3 IMPLANT
CLIP VESOLOCK MED LG 6/CT (CLIP) IMPLANT
CLOTH BEACON ORANGE TIMEOUT ST (SAFETY) ×3 IMPLANT
COVER SURGICAL LIGHT HANDLE (MISCELLANEOUS) ×3 IMPLANT
COVER TIP SHEARS 8 DVNC (MISCELLANEOUS) ×2 IMPLANT
COVER TIP SHEARS 8MM DA VINCI (MISCELLANEOUS) ×3
DERMABOND ADVANCED (GAUZE/BANDAGES/DRESSINGS) ×1
DERMABOND ADVANCED .7 DNX12 (GAUZE/BANDAGES/DRESSINGS) ×2 IMPLANT
DRAIN CHANNEL RND F F (WOUND CARE) IMPLANT
DRAPE ARM DVNC X/XI (DISPOSABLE) ×8 IMPLANT
DRAPE COLUMN DVNC XI (DISPOSABLE) ×2 IMPLANT
DRAPE DA VINCI XI ARM (DISPOSABLE) ×12
DRAPE DA VINCI XI COLUMN (DISPOSABLE) ×3
DRAPE INCISE IOBAN 66X45 STRL (DRAPES) ×3 IMPLANT
DRAPE SHEET LG 3/4 BI-LAMINATE (DRAPES) ×6 IMPLANT
DRAPE SURG IRRIG POUCH 19X23 (DRAPES) ×3 IMPLANT
DRSG TEGADERM 2-3/8X2-3/4 SM (GAUZE/BANDAGES/DRESSINGS) ×1 IMPLANT
ELECT PENCIL ROCKER SW 15FT (MISCELLANEOUS) ×3 IMPLANT
ELECT REM PT RETURN 15FT ADLT (MISCELLANEOUS) ×3 IMPLANT
EXTRACTOR STONE 1.7FRX115CM (UROLOGICAL SUPPLIES) IMPLANT
GAUZE 4X4 16PLY ~~LOC~~+RFID DBL (SPONGE) IMPLANT
GAUZE SPONGE 2X2 8PLY STRL LF (GAUZE/BANDAGES/DRESSINGS) IMPLANT
GLOVE SURG ENC MOIS LTX SZ6.5 (GLOVE) ×3 IMPLANT
GLOVE SURG ENC TEXT LTX SZ7.5 (GLOVE) ×9 IMPLANT
GOWN STRL REUS W/TWL LRG LVL3 (GOWN DISPOSABLE) ×3 IMPLANT
GOWN STRL REUS W/TWL XL LVL3 (GOWN DISPOSABLE) ×6 IMPLANT
GUIDEWIRE ANG ZIPWIRE 038X150 (WIRE) IMPLANT
GUIDEWIRE STR DUAL SENSOR (WIRE) ×3 IMPLANT
HOLDER FOLEY CATH W/STRAP (MISCELLANEOUS) ×3 IMPLANT
IRRIG SUCT STRYKERFLOW 2 WTIP (MISCELLANEOUS) ×3
IRRIGATION SUCT STRKRFLW 2 WTP (MISCELLANEOUS) ×2 IMPLANT
KIT BASIN OR (CUSTOM PROCEDURE TRAY) ×3 IMPLANT
KIT TURNOVER KIT A (KITS) ×3 IMPLANT
MANIFOLD NEPTUNE II (INSTRUMENTS) ×3 IMPLANT
MANIPULATOR UTERINE 4.5 ZUMI (MISCELLANEOUS) IMPLANT
MARKER SKIN DUAL TIP RULER LAB (MISCELLANEOUS) ×3 IMPLANT
MESH Y UPSYLON VAGINAL (Mesh General) ×1 IMPLANT
OCCLUDER COLPOPNEUMO (BALLOONS) IMPLANT
PACK CYSTO (CUSTOM PROCEDURE TRAY) ×3 IMPLANT
PACKING VAGINAL (PACKING) ×1 IMPLANT
PAD OB MATERNITY 4.3X12.25 (PERSONAL CARE ITEMS) IMPLANT
PAD POSITIONING PINK XL (MISCELLANEOUS) ×3 IMPLANT
PANTS MESH DISP LRG (UNDERPADS AND DIAPERS) IMPLANT
PANTS MESH DISPOSABLE L (UNDERPADS AND DIAPERS)
POUCH SPECIMEN RETRIEVAL 10MM (ENDOMECHANICALS) IMPLANT
SEAL CANN UNIV 5-8 DVNC XI (MISCELLANEOUS) ×8 IMPLANT
SEAL XI 5MM-8MM UNIVERSAL (MISCELLANEOUS) ×12
SET IRRIG Y TYPE TUR BLADDER L (SET/KITS/TRAYS/PACK) IMPLANT
SET TUBE SMOKE EVAC HIGH FLOW (TUBING) ×3 IMPLANT
SHEATH URETERAL 12FRX28CM (UROLOGICAL SUPPLIES) IMPLANT
SHEATH URETERAL 12FRX35CM (MISCELLANEOUS) IMPLANT
SHEET LAVH (DRAPES) ×3 IMPLANT
SOL PREP POV-IOD 4OZ 10% (MISCELLANEOUS) IMPLANT
SOLUTION ELECTROLUBE (MISCELLANEOUS) ×3 IMPLANT
SPONGE GAUZE 2X2 STER 10/PKG (GAUZE/BANDAGES/DRESSINGS) ×1
STENT POLARIS LOOP 6FR X 24 CM (STENTS) ×1 IMPLANT
SURGILUBE 2OZ TUBE FLIPTOP (MISCELLANEOUS) IMPLANT
SUT MNCRL AB 4-0 PS2 18 (SUTURE) ×6 IMPLANT
SUT PROLENE 2 0 CT 1 (SUTURE) ×3 IMPLANT
SUT VIC AB 0 CT1 27 (SUTURE) ×3
SUT VIC AB 0 CT1 27XBRD ANTBC (SUTURE) ×2 IMPLANT
SUT VIC AB 2-0 SH 27 (SUTURE) ×15
SUT VIC AB 2-0 SH 27XBRD (SUTURE) ×10 IMPLANT
SUT VIC AB 3-0 SH 27 (SUTURE) ×3
SUT VIC AB 3-0 SH 27X BRD (SUTURE) ×2 IMPLANT
SUT VICRYL 0 UR6 27IN ABS (SUTURE) ×4 IMPLANT
SYR 50ML LL SCALE MARK (SYRINGE) IMPLANT
SYR BULB IRRIG 60ML STRL (SYRINGE) IMPLANT
TOWEL OR 17X26 10 PK STRL BLUE (TOWEL DISPOSABLE) ×3 IMPLANT
TRAY LAPAROSCOPIC (CUSTOM PROCEDURE TRAY) ×3 IMPLANT
TROCAR ENDOPATH XCEL 12X100 BL (ENDOMECHANICALS) IMPLANT
TROCAR XCEL 12X100 BLDLESS (ENDOMECHANICALS) ×3 IMPLANT
TROCAR XCEL NON-BLD 5MMX100MML (ENDOMECHANICALS) IMPLANT
TUBING CONNECTING 10 (TUBING) ×3 IMPLANT
TUBING UROLOGY SET (TUBING) ×3 IMPLANT
WATER STERILE IRR 1000ML POUR (IV SOLUTION) ×3 IMPLANT

## 2021-05-24 NOTE — H&P (Signed)
70 year old female who is here today to discuss sacral colpopexy. She has a history of pelvic organ prolapse. This is been present for approximately 1 year. She has associated urinary urgency and associated urge incontinence. She also has leakage with stress maneuvers. She was seen and evaluated by Dr. Matilde Sprang, who noted that she had significant vault prolapse from the apex of the vagina. She was referred to me for further discussion and treatment.   The patient underwent urodynamics which demonstrated a fairly overactive bladder within normal bladder capacity and no significant evidence of stress incontinence when the bladder was reduced.   The patient had a hysterectomy 35 years ago. The patient had a partial nephrectomy in 2009, he had no evidence of recurrence. The patient has now had 4 separate back surgeries, and at this point is feeling significantly better.   The patient has some anxiety and asthma. She also has GERD. She has no history of hypertension or diabetes. She has tolerated anesthesia well in the past.   05/15/21: Patient presents today with concerns of a UTI. Her culture from her preoperative appointment at [facility] did grow Klebsiella. Upon entering the room the patient is toxic appearing with a respiratory rate of 32. She is very warm to touch and her fever is 101. 4 after Tylenol. I advised she proceed to the emergency department due to concern for sepsis. I advised she be taken there by EMS.     ALLERGIES: Codeine Derivatives - Trouble Breathing Morphine Derivatives - Swelling, "throat" Penicillins - Trouble Breathing Sulfa Drugs - Trouble Breathing     MEDICATIONS: Estradiol 1 mg tablet 1 tablet PO Daily Furosemide 40 mg tablet Oral Levothyroxine 50 mcg capsule 1 capsule PO Daily Otezla 30 mg tablet 1 tablet PO BID Protonix 40 mg tablet, delayed release 1 tablet PO Daily Xanax 1 mg tablet Oral     GU PSH: Complex cystometrogram, w/ void pressure and urethral pressure  profile studies, any technique - 01/19/2021 Complex Uroflow - 01/19/2021 Emg surf Electrd - 01/19/2021 Hysterectomy Unilat SO - 2009 Inject For cystogram - 01/19/2021 Intrabd voidng Press - 01/19/2021 Partial nephrectomy (laparoscopic) - 2009        PSH Notes: Kidney Surgery Laparoscopic Partial Nephrectomy, Cholecystectomy Laparoscopic, Hysterectomy, Hand Surgery, Knee Surgery, Elbow Surgery, Back Surgery, Foot Repair   NON-GU PSH: Cholecystectomy (laparoscopic) - 2009       GU PMH: Cystocele, Unspec - 02/27/2021 Mixed incontinence - 02/27/2021, - 02/02/2021, - 12/20/2020 Urinary Frequency - 02/27/2021, - 02/02/2021, - 01/19/2021, - 12/20/2020 Nocturia - 12/20/2020 Kidney Cancer Unspec, except renal pelvis, Renal cell carcinoma, unspecified laterality - 2014 Neoplasm of unspecified behavior of unspecified kidney, Renal neoplasm - 2014     NON-GU PMH: Encounter for general adult medical examination without abnormal findings, Encounter for preventive health examination - 2015 Anxiety, Anxiety - 2014 Asthma, Asthma - 2014 Gastric ulcer, unspecified as acute or chronic, without hemorrhage or perforation, Gastric Ulcer - 2014 Personal history of other diseases of the digestive system, History of esophageal reflux - 2014 Personal history of other mental and behavioral disorders, History of depression - 2014 GERD     FAMILY HISTORY: Kidney Cancer - No Family History No pertinent family history - Other   SOCIAL HISTORY: Marital Status: Single Current Smoking Status: Patient does not smoke anymore. Has not smoked since 12/13/2005. Smoked for 10 years. Smoked 1/2 pack per day.   Tobacco Use Assessment Completed: Used Tobacco in last 30 days? Does not use smokeless tobacco. Has never drank.  Drinks 1 caffeinated drink per day.     Notes: Previous History Of Smoking, Marital History - Single, Occupation:, Alcohol Use   REVIEW OF SYSTEMS:    GU Review Female:   Patient reports frequent urination, hard to  postpone urination, and burning /pain with urination. Patient denies get up at night to urinate, leakage of urine, stream starts and stops, trouble starting your stream, have to strain to urinate, and being pregnant. Gastrointestinal (Upper):   Patient reports nausea. Patient denies vomiting and indigestion/ heartburn. Gastrointestinal (Lower):   Patient denies diarrhea and constipation. Constitutional:   Patient reports fatigue. Patient denies fever, night sweats, and weight loss. Skin:   Patient denies skin rash/ lesion and itching. Eyes:   Patient denies blurred vision and double vision. Ears/ Nose/ Throat:   Patient denies sore throat and sinus problems. Hematologic/Lymphatic:   Patient denies swollen glands and easy bruising. Cardiovascular:   Patient denies leg swelling and chest pains. Respiratory:   Patient reports cough and shortness of breath.   Musculoskeletal:   Patient denies back pain and joint pain. Neurological:   Patient denies headaches and dizziness. Psychologic:   Patient denies depression and anxiety.   VITAL SIGNS:      05/15/2021 03:33 PM 05/15/2021 02:38 PM BP   134/63 mmHg Pulse   98 /min Temperature 101.4 F / 38.5 C 97.1 F / 36.1 C   GU PHYSICAL EXAMINATION:    Vagina: Mild vaginal atrophy. Small rectocele. Large cystocele, midline. No stenosis. No enterocele.     MULTI-SYSTEM PHYSICAL EXAMINATION:    Constitutional: Thin. Patient is very warm to touch and toxic appearing Cardiovascular: Warm extremities. Tachycardic Neurologic / Psychiatric: She is oriented to person and place but has some mild confusion that is not normal for her     Complexity of Data: Source Of History:  Patient, Medical Record Summary Records Review:   Previous Hospital Records, Previous Patient Records Urine Test Review:   Urinalysis Urodynamics Review:   Review Bladder Scan  05/15/21 Urinalysis Urine Appearance Cloudy   Urine Color Yellow   Urine Glucose Neg mg/dL Urine  Bilirubin Neg mg/dL Urine Ketones Neg mg/dL Urine Specific Gravity 1.020   Urine Blood Neg ery/uL Urine pH 6.0   Urine Protein 1+ mg/dL Urine Urobilinogen 0.2 mg/dL Urine Nitrites Positive   Urine Leukocyte Esterase 3+ leu/uL Urine WBC/hpf 40 - 60/hpf   Urine RBC/hpf 3 - 10/hpf   Urine Epithelial Cells 6 - 10/hpf   Urine Bacteria Many (>50/hpf)   Urine Mucous Not Present   Urine Yeast NS (Not Seen)   Urine Trichomonas Not Present   Urine Cystals Amorph Urates   Urine Casts NS (Not Seen)   Urine Sperm Not Present     PROCEDURES:         PVR Ultrasound - [id number] Scanned Volume: 233 cc        Simple Foley Catheterization - [id number] A 16 French Foley catheter was inserted into the bladder using sterile technique. The patient was taught routine catheter care. A bedside bag was connected.          Urinalysis w/Scope Dipstick Dipstick Cont'd Micro Color: Yellow Bilirubin: Neg mg/dL WBC/hpf: 40 - 60/hpf Appearance: Cloudy Ketones: Neg mg/dL RBC/hpf: 3 - 10/hpf Specific Gravity: 1.020 Blood: Neg ery/uL Bacteria: Many (>50/hpf) pH: 6.0 Protein: 1+ mg/dL Cystals: Amorph Urates Glucose: Neg mg/dL Urobilinogen: 0.2 mg/dL Casts: NS (Not Seen)   Nitrites: Positive Trichomonas: Not Present   Leukocyte Esterase: 3+ leu/uL Mucous: Not Present  Epithelial Cells: 6 - 10/hpf     Yeast: NS (Not Seen)     Sperm: Not Present     ASSESSMENT:     ICD-10 Details 1 NON-GU:   Fever, unspecified - R50.9 Acute, Systemic Symptoms   PLAN:            Orders Labs CULTURE, URINE           Document Letter(s):  Created for Patient: Clinical Summary          Notes:   Based off of her parents today I advised EMS take her to the emergency department. She was agreeable to this plan. EMS was given report along with the emergency department physician. Prior to leaving the patient requested a Foley catheter due to pain and pressure. This was placed in our office today.

## 2021-05-24 NOTE — Op Note (Signed)
Preoperative diagnosis:  Pelvic organ prolapse  Bilateral hydroureteronephrosis  Postoperative diagnosis:  same   Procedure: Robotic assisted laparoscopic sacrocolpopexy Cystoscopy, left retrograde pyelogram with interpretation Left ureteral stent placement Left nephrostomy tube removal  Surgeon: [name], MD 1st assistant: Debbrah Alar, PA-C Resident Surgeon: Yousef Abu-Sahla  An assistant was required for this surgical procedure.  The duties of the assistant included but were not limited to suctioning, passing suture, camera manipulation, retraction. This procedure would not be able to be performed without an Environmental consultant.   Anesthesia: General  Complications: None  Intraoperative findings:  #1: [place] scientific Upsilon Y-Mesh placed, cut to specific vaginal length #2: The left retrograde pyelogram demonstrated intermittent normal caliber ureter with no significant evidence of hydroureteronephrosis.  The renal pelvis was dilated, and there was a nephrostomy tube curled within the renal pelvis itself.  EBL: 50cc  Specimens: None  Indication: Theresa Holloway is a 70 y.o. female patient with symptomatic pelvic organ prolapse.  After reviewing the management options for treatment, he elected to proceed with the above surgical procedure(s). We have discussed the potential benefits and risks of the procedure, side effects of the proposed treatment, the likelihood of the patient achieving the goals of the procedure, and any potential problems that might occur during the procedure or recuperation. Informed consent has been obtained.  Description of procedure:  The patient was brought to the operating room placed on the table in supine position.  General anesthesia was then induced endotracheal tube inserted.  She was then placed in the dorsolithotomy position and prepped and draped in the routine sterile fashion.  Timeout was then performed.  A 21 French 30 cystoscope was gently  passed through the patient's urethra and into the bladder under visual guidance.  Using a 5 [place] open-ended ureteral catheter a retrograde pyelogram was performed on the patient's left side with the above findings.  A wire was then advanced through the open-ended catheter removing the catheter over the wire.  The 24 cm time 6 French double-J ureteral stent was then passed over the wire and into the patient's left renal pelvis under fluoroscopic guidance.  Once the stent was noted to be well within the renal pelvis it was advanced to the bladder neck and the wire was completely removed.  At this point we also removed the patient's nephrostomy tube under fluoroscopy.  Stent was noted to be in good position at the end of the case.  The patient was subsequently reprepped and draped and placed in steep Trendelenburg.  A second timeout was performed for the sacrocolpopexy.  A Foley catheter was then placed and placed to gravity drainage. I then made a periumbilical incision carrying the dissection down to the patient's fascia with electrocautery.  Once to the fascia, the fascia was incised the peritoneum opened.  A 83m port was then placed into the abdomen.  The abdomen was insufflated and the remaining ports placed under digital guidance.  2 ports were placed lateral to the umbilicus on the right proximally 10 cm apart.  The most lateral port was approximately 3 cm above the anterior iliac spine.  2 additional ports were placed in the patient's right side in comparable positions to the most lateral port on the right was a 12 mm port.the robot was then docked at an angle from the leg obliquely along the side of the left leg.  We then began our surgery by cleaning up some of the pelvic adhesions to the small bowel and colon.  Once this was completed I started dissecting at the sacral promontory located 3 cm medial to the location where the ureter crosses over the iliac vessels at the pelvic brim. The posterior  peritoneum was incised and the sacral prominence cleared off an area taking care to avoid the middle sacral vessels and the iliac branches.  I then created a posterior peritoneal tunnel starting at the sacral promontory and tunneling down the right pelvic sidewall down into the pelvis breaking back through the posterior peritoneum around the vesico-vaginal junction posteriorly.  I then continued the posterior dissection retracting down on the rectum and finding the avascular plane between the posterior vaginal wall and the rectum.  I carried this dissection down as far as I could to along the area of the perineal body.  I then turned my attention to the anterior plane between the anterior vaginal wall and the bladder.  I was able to obtain access to the avascular plane and with a combination of both monopolar cautery and blunt dissection was able to clean and nice down to the bladder neck.  I then turned my attention back to the patient's uterus and skeletonized the right uterine artery and vein and then took this with a series of bipolar moves.  I then performed a similar uterus pedicle ligation on the left.    Mesh was measured at approximately 7cm anteriorly and 7 cm posteriorly and I cut this on the back table.  The mesh was then placed into the patient's abdomen through the assistant port and the anterior leaf was secured down onto the anterior vaginal wall with the apex at the bladder neck.  The posterior leaf was then secured down on the posterior vaginal wall.  These were sewn down with 2-0 Vicryl.  Between 6 and 8 were done on each side.  At this point I then went back to the previously dissected sacral promontory and posterior peritoneal tunnel and inserted a instrument through the tunnel and grasped the end of the mesh at the vaginal cuff and pull it up to the sacrum.  I then checked to ensure that the sacral mesh was not too tight by performing a vaginal exam.  I then secured the sacral leg of the mesh  using a 0 Prolene.  I then reapproximated the posterior peritoneum with a 2-0 Vicryl in a running fashion around the sacral promontory.  The pelvic peritoneum was closed using a pursestring.   The fascia of the 12 mm port was then closed with 0 Vicryl in a figure-of-eight fashion.  The skin was closed with 4-0 Monocryl's.  Dermabond was applied to the incision and exparel injected into the incisions.  Estrace impregnated packing was then placed into her vagina which will be left in overnight.  The patient was subsequently extubated and returned to the PACU in excellent condition.   [name], M.D.

## 2021-05-24 NOTE — Progress Notes (Signed)
Pharmacy Antibiotic Note  Theresa Holloway is a 70 y.o. female with hx partial nephrectomy for left-sided renal cancer and bladder prolapse presented to the ED on 05/15/2021 with c/o fever, dysuria and hematuria. She was found to have bilateral hydronephrosis and underwent cystoscopy with right ureteral stent placement on 8/4.  UCx collected at [facility] urology on 8/1 had ESBL Ecoli. Inpatient ucx  and blood cx on 8/1 are growing out klebsiella pneumo. She's currently on meropenem for UTI and bacteremia.  Day 6 of 10 meropenem WBC trending down Afebrile  Plan: Continue meropenem 1g IV q8 per current renal function  ________________________________________  Height: '5\' 6"'$  (167.6 cm) Weight: 56.9 kg (125 lb 7.1 oz) IBW/kg (Calculated) : 59.3  Temp (24hrs), Avg:98.2 F (36.8 C), Min:98.1 F (36.7 C), Max:98.4 F (36.9 C)  Recent Labs  Lab 05/19/21 0509 05/20/21 0557 05/21/21 0530 05/22/21 0438 05/23/21 0453 05/24/21 0517  WBC 30.2* 19.1* 15.8* 14.9* 12.1* 10.8*  CREATININE 1.88* 1.21* 0.77 0.69 0.76  --   GENTRANDOM 7.3  --   --   --   --   --      Estimated Creatinine Clearance: 60.5 mL/min (by C-G formula based on SCr of 0.76 mg/dL).    Allergies  Allergen Reactions   Codeine Anaphylaxis   Morphine And Related Anaphylaxis   Penicillins Anaphylaxis   Sulfa Antibiotics Anaphylaxis   Other     Cat gut suture - too long to heal    Prednisone     Excessive weight gain - 35lbs in 10 days   Skyrizi [Risankizumab] Nausea And Vomiting    Pt also got blisters when taking this med    Thank you for allowing pharmacy to be a part of this patient's care.  Kara Mead 05/24/2021 8:05 AM

## 2021-05-24 NOTE — Discharge Instructions (Addendum)
Important: Please take the antibiotics (fosfomycin) that have been prescribed for you  Activity:  You are encouraged to ambulate frequently (about every hour during waking hours) to help prevent blood clots from forming in your legs or lungs.  However, you should not engage in any heavy lifting (> 10-15 lbs), strenuous activity, or straining. Diet: You should advance your diet as instructed by your physician.  It will be normal to have some bloating, nausea, and abdominal discomfort intermittently. Prescriptions:  You will be provided a prescription for pain medication to take as needed.  If your pain is not severe enough to require the prescription pain medication, you may take extra strength Tylenol instead which will have less side effects.  You should also take a prescribed stool softener to avoid straining with bowel movements as the prescription pain medication may constipate you. Incisions: You may remove your dressing bandages 48 hours after surgery if not removed in the hospital.  You will either have some small staples or special tissue glue at each of the incision sites. Once the bandages are removed (if present), the incisions may stay open to air.  You may start showering (but not soaking or bathing in water) the 2nd day after surgery and the incisions simply need to be patted dry after the shower.  No additional care is needed. What to call us about: You should call the office [phone]) if you develop fever > 101 or develop persistent vomiting.

## 2021-05-24 NOTE — Interval H&P Note (Signed)
History and Physical Interval Note: The patient was admitted 8 days ago for UTI and bacteremia.  She subsequently underwent a right ureteral stent and left nephrostomy tube for bilateral hydroureteronephrosis.  She has been treated with IV meropenem for the past 7 days.  Her most recent blood culture was negative.  Her renal function has returned to normal and her white blood cell count is also within normal limits.  The patient feels well, strong, and eager to proceed with the surgery.  As such, we will plan to move forward as scheduled.   05/24/2021 12:58 PM  Theresa Holloway  has presented today for surgery, with the diagnosis of pelvic organ prolapse.  The various methods of treatment have been discussed with the patient and family. After consideration of risks, benefits and other options for treatment, the patient has consented to  Procedure(s): XI ROBOTIC ASSISTED LAPAROSCOPIC SACROCOLPOPEXY (N/A) CYSTOSCOPY WITH RETROGRADE PYELOGRAM/URETERAL STENT PLACEMENT (Left) as a surgical intervention.  The patient's history has been reviewed, patient examined, no change in status, stable for surgery.  I have reviewed the patient's chart and labs.  Questions were answered to the patient's satisfaction.     Theresa Holloway

## 2021-05-24 NOTE — Transfer of Care (Signed)
Immediate Anesthesia Transfer of Care Note  Patient: Theresa Holloway  Procedure(s) Performed: XI ROBOTIC ASSISTED LAPAROSCOPIC SACROCOLPOPEXY CYSTOSCOPY WITH RETROGRADE PYELOGRAM/URETERAL STENT PLACEMENT (Left)  Patient Location: PACU  Anesthesia Type:General  Level of Consciousness: awake  Airway & Oxygen Therapy: Patient Spontanous Breathing and Patient connected to face mask oxygen  Post-op Assessment: Report given to RN and Post -op Vital signs reviewed and stable  Post vital signs: Reviewed and stable  Last Vitals:  Vitals Value Taken Time  BP 120/57 05/24/21 1650  Temp    Pulse 87 05/24/21 1656  Resp 19 05/24/21 1656  SpO2 94 % 05/24/21 1656  Vitals shown include unvalidated device data.  Last Pain:  Vitals:   05/24/21 1210  TempSrc: Oral  PainSc:       Patients Stated Pain Goal: 2 ([phone]  Complications: No notable events documented.

## 2021-05-24 NOTE — Progress Notes (Signed)
PROGRESS NOTE    DELANY Holloway  [id number] DOB: 06-01-1951 DOA: 05/15/2021 PCP: Sharilyn Sites, MD    Brief Narrative:  70 year old female with history of COPD, asthma, anxiety, history of partial nephrectomy for left-sided renal cancer presented to the emergency room with fever, dysuria and hematuria.  Patient has been suffering from bladder prolapse and followed up with urology with planned sling surgery on 8/10.  Patient had frequency and dysuria and was given Foley catheter on urology visit on 8/1.  Urine culture taken on 7/28 for preop eval was positive for ESBL E. coli.  Blood cultures on 8/1 positive for Klebsiella.  Urine culture also with Klebsiella on repeat exam. Urine culture from office on 7/28 positive for ESBL E. coli. With persistent elevated white count found to have bilateral severe hydronephrosis. Cystoscopy on 8/4, stenting right ureter.  Left ureter unable to stent.  IR DID PERCUTANEOUS NEPHROSTOMY ON THE LEFT SIDE 8/5.   Outpatient urine culture and sensitivity results with ESBL. Copy and pasted here. Escherichia coli ESBL Antimicrobic/Dose MIC SYSTEMIC URINE --------------------------------------------------------------- Amp/Sulbactam 16/8 I I Amikacin <16 S S Ampicillin >16 R* R* Amox/K Clav <8/4 S S Aztreonam >16 ESBL ESBL Ceftriaxone >32 ESBL ESBL Ceftazidime >16 ESBL ESBL Cefotaxime >32 ESBL ESBL Cefazolin >16 R* R* Ciprofloxacin >2 R R Cefepime >16 R* R* Cefotetan <16 S S Ertapenem <0.5 S S Nitrofurantoin <32 S Gentamicin <4 S S Levofloxacin >4 R R Meropenem <1 S S Pip/Tazo <16 S S Trimeth/Sulfa <2/38 S S Tetracycline <4 S S Tobramycin <4 S S  Assessment & Plan:   Principal Problem:   Sepsis secondary to UTI [facility]) Active Problems:   Sepsis ([place])   Hypokalemia   AKI (acute kidney injury) ([place])   Bladder prolapse, female, acquired  Sepsis secondary to Klebsiella bacteremia/Klebsiella UTI: Recent ESBL E. coli. Treated with different  antibiotics.  Urine culture sensitivity available from the office with ESBL. Currently on meropenem. Treatment for ESBL day 6/10. Severe bilateral hydronephrosis with prolapsed bladder Status post cystoscopy, right-sided stenting of the ureter.  Unable to stent left side. IR did percutaneous nephrostomy 8/5.   Clinically improved, scheduled for internalization of left ureteric stent and bladder sling operation today.  Acute kidney injury: Due to #1.  Renal function stabilizing. Continue nephrostomy care.  Hypokalemia/hypomagnesemia: Replaced with improvement .  Keep on potassium supplement.  Hypothyroidism: Euthyroid on replacement.  Continue.  Anxiety disorder: On alprazolam.  Continue.   DVT prophylaxis: Place and maintain sequential compression device Start: 05/18/21 1146 SCD   Code Status: Full code Family Communication: Patient's friend at the bedside. Disposition Plan: Status is: Inpatient  Remains inpatient appropriate because:Inpatient level of care appropriate due to severity of illness  Dispo: The patient is from: Home              Anticipated d/c is to: Home.  Anticipate tomorrow.              Patient currently is not medically stable to d/c.   Difficult to place patient No         Consultants:  Urology Interventional radiology  Procedures:  None  Antimicrobials:  Ancef 8/1--- 8/4 Gentamicin 8/4 Meropenem 8/5----   Subjective: Seen and examined.  Friend at bedside.  No new events.  Pain has improved.  Afebrile.  Walked around.  Looking forward to go to surgery.  Objective: Vitals:   05/23/21 1944 05/23/21 2017 05/24/21 0610 05/24/21 0809  BP:  (!) 124/55 (!) 113/54   Pulse:  89 81   Resp:  20 20   Temp:  98.4 F (36.9 C) 98.2 F (36.8 C)   TempSrc:      SpO2: 94% 96% 96% 93%  Weight:      Height:        Intake/Output Summary (Last 24 hours) at 05/24/2021 1039 Last data filed at 05/24/2021 0600 Gross per 24 hour  Intake 0 ml  Output 1050  ml  Net -1050 ml   Filed Weights   05/15/21 1617 05/18/21 1544  Weight: 56.9 kg 56.9 kg    Examination:  General: Looks fairly comfortable.  Cardiovascular: S1-S2 normal.  No added sounds. Respiratory: Bilateral clear.  No added sounds. Gastrointestinal: Soft and nontender.  Bowel sounds present. Ext: No swelling or edema.  No cyanosis. Neuro: Alert oriented x4.  No focal deficits. Left nephrostomy tube intact and dry.    Data Reviewed: I have personally reviewed following labs and imaging studies  CBC: Recent Labs  Lab 05/20/21 0557 05/21/21 0530 05/22/21 0438 05/23/21 0453 05/24/21 0517  WBC 19.1* 15.8* 14.9* 12.1* 10.8*  NEUTROABS 15.4* 12.1* 11.2* 8.4* 7.2  HGB 9.6* 8.9* 9.1* 9.3* 9.0*  HCT 30.4* 28.2* 28.8* 29.9* 28.9*  MCV 87.4 86.5 88.9 88.2 88.7  PLT 758* 660* 618* 635* [id number]*   Basic Metabolic Panel: Recent Labs  Lab 05/18/21 0525 05/19/21 0509 05/20/21 0557 05/21/21 0530 05/22/21 0438 05/23/21 0453  NA 131* 138 135 138 139 140  K 3.5 3.9 3.0* 3.1* 3.7 3.2*  CL 106 110 106 106 103 100  CO2 18* 16* 21* '24 27 30  '$ GLUCOSE 109* 134* 96 105* 102* 99  BUN '17 19 16 9 8 '$ 7*  CREATININE 2.11* 1.88* 1.21* 0.77 0.69 0.76  CALCIUM 7.5* 8.5* 8.1* 8.1* 7.9* 8.1*  MG 1.9 1.9  --   --   --   --   PHOS 2.9 3.8  --   --   --   --    GFR: Estimated Creatinine Clearance: 60.5 mL/min (by C-G formula based on SCr of 0.76 mg/dL). Liver Function Tests: No results for input(s): AST, ALT, ALKPHOS, BILITOT, PROT, ALBUMIN in the last 168 hours.  No results for input(s): LIPASE, AMYLASE in the last 168 hours. No results for input(s): AMMONIA in the last 168 hours. Coagulation Profile: Recent Labs  Lab 05/18/21 1945  INR 1.2   Cardiac Enzymes: No results for input(s): CKTOTAL, CKMB, CKMBINDEX, TROPONINI in the last 168 hours. BNP (last 3 results) No results for input(s): PROBNP in the last 8760 hours. HbA1C: No results for input(s): HGBA1C in the last 72  hours. CBG: No results for input(s): GLUCAP in the last 168 hours. Lipid Profile: No results for input(s): CHOL, HDL, LDLCALC, TRIG, CHOLHDL, LDLDIRECT in the last 72 hours. Thyroid Function Tests: No results for input(s): TSH, T4TOTAL, FREET4, T3FREE, THYROIDAB in the last 72 hours. Anemia Panel: No results for input(s): VITAMINB12, FOLATE, FERRITIN, TIBC, IRON, RETICCTPCT in the last 72 hours. Sepsis Labs: No results for input(s): PROCALCITON, LATICACIDVEN in the last 168 hours.   Recent Results (from the past 240 hour(s))  Blood culture (routine single)     Status: Abnormal   Collection Time: 05/15/21  5:10 PM   Specimen: BLOOD  Result Value Ref Range Status   Specimen Description   Final    BLOOD RIGHT ANTECUBITAL Performed at [facility] [address] [place], [place] [address]    Special Requests   Final    BOTTLES  DRAWN AEROBIC AND ANAEROBIC Blood Culture results may not be optimal due to an excessive volume of blood received in culture bottles Performed at [facility], [place] [address], [place], [place] [address]    Culture  Setup Time   Final    GRAM NEGATIVE RODS ANAEROBIC BOTTLE ONLY CRITICAL RESULT CALLED TO, READ BACK BY AND VERIFIED WITH: PHARMD J.GADHIA AT [id number] ON 05/16/2021 BY T.SAAD. Performed at [facility], [place] [address] [place], [place] [address]    Culture KLEBSIELLA PNEUMONIAE (A)  Final   Report Status 05/18/2021 FINAL  Final   Organism ID, Bacteria KLEBSIELLA PNEUMONIAE  Final      Susceptibility   Klebsiella pneumoniae - MIC*    AMPICILLIN >=32 RESISTANT Resistant     CEFAZOLIN <=4 SENSITIVE Sensitive     CEFEPIME <=0.12 SENSITIVE Sensitive     CEFTAZIDIME <=1 SENSITIVE Sensitive     CEFTRIAXONE <=0.25 SENSITIVE Sensitive     CIPROFLOXACIN <=0.25 SENSITIVE Sensitive     GENTAMICIN <=1 SENSITIVE Sensitive     IMIPENEM <=0.25 SENSITIVE Sensitive     TRIMETH/SULFA <=20 SENSITIVE Sensitive      AMPICILLIN/SULBACTAM 4 SENSITIVE Sensitive     PIP/TAZO <=4 SENSITIVE Sensitive     * KLEBSIELLA PNEUMONIAE  Urine Culture     Status: Abnormal   Collection Time: 05/15/21  5:10 PM   Specimen: In/Out Cath Urine  Result Value Ref Range Status   Specimen Description   Final    IN/OUT CATH URINE Performed at [facility] [address], [place], [place] [address]    Special Requests   Final    NONE Performed at [facility], [place] [address], [place], [place] [address]    Culture >=100,000 COLONIES/mL KLEBSIELLA PNEUMONIAE (A)  Final   Report Status 05/18/2021 FINAL  Final   Organism ID, Bacteria KLEBSIELLA PNEUMONIAE (A)  Final      Susceptibility   Klebsiella pneumoniae - MIC*    AMPICILLIN >=32 RESISTANT Resistant     CEFAZOLIN <=4 SENSITIVE Sensitive     CEFEPIME <=0.12 SENSITIVE Sensitive     CEFTRIAXONE <=0.25 SENSITIVE Sensitive     CIPROFLOXACIN <=0.25 SENSITIVE Sensitive     GENTAMICIN <=1 SENSITIVE Sensitive     IMIPENEM <=0.25 SENSITIVE Sensitive     NITROFURANTOIN 64 INTERMEDIATE Intermediate     TRIMETH/SULFA <=20 SENSITIVE Sensitive     AMPICILLIN/SULBACTAM 8 SENSITIVE Sensitive     PIP/TAZO <=4 SENSITIVE Sensitive     * >=100,000 COLONIES/mL KLEBSIELLA PNEUMONIAE  Resp Panel by RT-PCR (Flu A&B, Covid)     Status: None   Collection Time: 05/15/21  5:10 PM   Specimen: Nasopharyngeal(NP) swabs in vial transport medium  Result Value Ref Range Status   SARS Coronavirus 2 by RT PCR NEGATIVE NEGATIVE Final    Comment: (NOTE) SARS-CoV-2 target nucleic acids are NOT DETECTED.  The SARS-CoV-2 RNA is generally detectable in upper respiratory specimens during the acute phase of infection. The lowest concentration of SARS-CoV-2 viral copies this assay can detect is 138 copies/mL. A negative result does not preclude SARS-Cov-2 infection and should not be used as the sole basis for treatment or other patient management decisions. A  negative result may occur with  improper specimen collection/handling, submission of specimen other than nasopharyngeal swab, presence of viral mutation(s) within the areas targeted by this assay, and inadequate number of viral copies(<138 copies/mL). A negative result must be combined with clinical observations, patient history, and epidemiological information.  The expected result is Negative.  Fact Sheet for Patients:  EntrepreneurPulse.com.au  Fact Sheet for Healthcare Providers:  IncredibleEmployment.be  This test is no t yet approved or cleared by the [place] FDA and  has been authorized for detection and/or diagnosis of SARS-CoV-2 by FDA under an Emergency Use Authorization (EUA). This EUA will remain  in effect (meaning this test can be used) for the duration of the COVID-19 declaration under Section 564(b)(1) of the Act, 21 U.S.C.section 360bbb-3(b)(1), unless the authorization is terminated  or revoked sooner.       Influenza A by PCR NEGATIVE NEGATIVE Final   Influenza B by PCR NEGATIVE NEGATIVE Final    Comment: (NOTE) The Xpert Xpress SARS-CoV-2/FLU/RSV plus assay is intended as an aid in the diagnosis of influenza from Nasopharyngeal swab specimens and should not be used as a sole basis for treatment. Nasal washings and aspirates are unacceptable for Xpert Xpress SARS-CoV-2/FLU/RSV testing.  Fact Sheet for Patients: EntrepreneurPulse.com.au  Fact Sheet for Healthcare Providers: IncredibleEmployment.be  This test is not yet approved or cleared by the [place] FDA and has been authorized for detection and/or diagnosis of SARS-CoV-2 by FDA under an Emergency Use Authorization (EUA). This EUA will remain in effect (meaning this test can be used) for the duration of the COVID-19 declaration under Section 564(b)(1) of the Act, 21 U.S.C. section 360bbb-3(b)(1), unless the authorization  is terminated or revoked.  Performed at [facility], [place] [address], [place], [place] [address]   Blood Culture ID Panel (Reflexed)     Status: Abnormal   Collection Time: 05/15/21  5:10 PM  Result Value Ref Range Status   Enterococcus faecalis NOT DETECTED NOT DETECTED Final   Enterococcus Faecium NOT DETECTED NOT DETECTED Final   Listeria monocytogenes NOT DETECTED NOT DETECTED Final   Staphylococcus species NOT DETECTED NOT DETECTED Final   Staphylococcus aureus (BCID) NOT DETECTED NOT DETECTED Final   Staphylococcus epidermidis NOT DETECTED NOT DETECTED Final   Staphylococcus lugdunensis NOT DETECTED NOT DETECTED Final   Streptococcus species NOT DETECTED NOT DETECTED Final   Streptococcus agalactiae NOT DETECTED NOT DETECTED Final   Streptococcus pneumoniae NOT DETECTED NOT DETECTED Final   Streptococcus pyogenes NOT DETECTED NOT DETECTED Final   A.calcoaceticus-baumannii NOT DETECTED NOT DETECTED Final   Bacteroides fragilis NOT DETECTED NOT DETECTED Final   Enterobacterales DETECTED (A) NOT DETECTED Final    Comment: Enterobacterales represent a large order of gram negative bacteria, not a single organism. CRITICAL RESULT CALLED TO, READ BACK BY AND VERIFIED WITH: PHARMD J.GADHIA AT [id number] ON 05/16/2021 BY T.SAAD.    Enterobacter cloacae complex NOT DETECTED NOT DETECTED Final   Escherichia coli NOT DETECTED NOT DETECTED Final   Klebsiella aerogenes NOT DETECTED NOT DETECTED Final   Klebsiella oxytoca NOT DETECTED NOT DETECTED Final   Klebsiella pneumoniae DETECTED (A) NOT DETECTED Final    Comment: CRITICAL RESULT CALLED TO, READ BACK BY AND VERIFIED WITH: PHARMD J.GADHIA AT 1259 ON 05/16/2021 BY T.SAAD.    Proteus species NOT DETECTED NOT DETECTED Final   Salmonella species NOT DETECTED NOT DETECTED Final   Serratia marcescens NOT DETECTED NOT DETECTED Final   Haemophilus influenzae NOT DETECTED NOT DETECTED Final   Neisseria meningitidis NOT DETECTED  NOT DETECTED Final   Pseudomonas aeruginosa NOT DETECTED NOT DETECTED Final   Stenotrophomonas maltophilia NOT DETECTED NOT DETECTED Final   Candida albicans NOT DETECTED NOT DETECTED Final   Candida auris NOT DETECTED NOT DETECTED Final   Candida glabrata  NOT DETECTED NOT DETECTED Final   Candida krusei NOT DETECTED NOT DETECTED Final   Candida parapsilosis NOT DETECTED NOT DETECTED Final   Candida tropicalis NOT DETECTED NOT DETECTED Final   Cryptococcus neoformans/gattii NOT DETECTED NOT DETECTED Final   CTX-M ESBL NOT DETECTED NOT DETECTED Final   Carbapenem resistance IMP NOT DETECTED NOT DETECTED Final   Carbapenem resistance KPC NOT DETECTED NOT DETECTED Final   Carbapenem resistance NDM NOT DETECTED NOT DETECTED Final   Carbapenem resist OXA 48 LIKE NOT DETECTED NOT DETECTED Final   Carbapenem resistance VIM NOT DETECTED NOT DETECTED Final    Comment: Performed at [facility], [place] [address] [place], [place] [address]  Culture, blood (routine x 2)     Status: None   Collection Time: 05/17/21 11:50 AM   Specimen: BLOOD  Result Value Ref Range Status   Specimen Description   Final    BLOOD RIGHT ANTECUBITAL Performed at [facility] [address] [place], [place] [address]    Special Requests   Final    BOTTLES DRAWN AEROBIC AND ANAEROBIC BCLV Performed at [facility] [address], [place], [place] [address]    Culture   Final    NO GROWTH 6 DAYS Performed at [facility], [place] [address] [place], [place] [address]    Report Status 05/23/2021 FINAL  Final  Culture, blood (routine x 2)     Status: None   Collection Time: 05/17/21 11:50 AM   Specimen: BLOOD  Result Value Ref Range Status   Specimen Description   Final    BLOOD RT WRIST Performed at [facility] [address] [place], [place] [address]    Special Requests   Final    BOTTLES DRAWN AEROBIC AND ANAEROBIC Blood Culture results  may not be optimal due to an inadequate volume of blood received in culture bottles Performed at [facility] [address], [place],  [address]    Culture   Final    NO GROWTH 6 DAYS Performed at [facility], [place] [address] [place],  [address]    Report Status 05/23/2021 FINAL  Final         Radiology Studies: No results found.      Scheduled Meds:  ALPRAZolam  1 mg Oral QHS   Chlorhexidine Gluconate Cloth  6 each Topical Daily   estradiol  1 mg Oral Daily   ipratropium  1 spray Each Nare Daily   liothyronine  25 mcg Oral Daily   loratadine  10 mg Oral Daily   mometasone-formoterol  2 puff Inhalation BID   montelukast  10 mg Oral QHS   pantoprazole  40 mg Oral Daily   polyethylene glycol  17 g Oral Daily   potassium chloride  40 mEq Oral BID   Continuous Infusions:  meropenem (MERREM) IV 1 g (05/24/21 1035)     LOS: 8 days    Time spent: 30 minutes    Barb Merino, MD [facility] Pager [phone]

## 2021-05-24 NOTE — Anesthesia Postprocedure Evaluation (Signed)
Anesthesia Post Note  Patient: Theresa Holloway  Procedure(s) Performed: XI ROBOTIC ASSISTED LAPAROSCOPIC SACROCOLPOPEXY CYSTOSCOPY WITH RETROGRADE PYELOGRAM/URETERAL STENT PLACEMENT (Left)     Patient location during evaluation: PACU Anesthesia Type: General Level of consciousness: awake and alert Pain management: pain level controlled Vital Signs Assessment: post-procedure vital signs reviewed and stable Respiratory status: spontaneous breathing, nonlabored ventilation and respiratory function stable Cardiovascular status: blood pressure returned to baseline and stable Postop Assessment: no apparent nausea or vomiting Anesthetic complications: no   No notable events documented.  Last Vitals:  Vitals:   05/24/21 1730 05/24/21 1752  BP: (!) 118/57 (!) 121/58  Pulse: 72 82  Resp: 14 18  Temp: 36.5 C 36.6 C  SpO2: 96% 99%    Last Pain:  Vitals:   05/24/21 1730  TempSrc:   PainSc: Asleep                 Lynda Rainwater

## 2021-05-24 NOTE — Progress Notes (Signed)
Patient off the floor and down to surgery.

## 2021-05-24 NOTE — Anesthesia Procedure Notes (Signed)
Procedure Name: Intubation Date/Time: 05/24/2021 1:25 PM Performed by: Maxwell Caul, CRNA Pre-anesthesia Checklist: Patient identified, Emergency Drugs available, Suction available and Patient being monitored Patient Re-evaluated:Patient Re-evaluated prior to induction Oxygen Delivery Method: Circle system utilized Preoxygenation: Pre-oxygenation with 100% oxygen Induction Type: IV induction Ventilation: Mask ventilation without difficulty Laryngoscope Size: Mac and 4 Grade View: Grade I Tube type: Oral Tube size: 7.0 mm Number of attempts: 1 Airway Equipment and Method: Stylet Placement Confirmation: ETT inserted through vocal cords under direct vision, positive ETCO2 and breath sounds checked- equal and bilateral Secured at: 21 cm Tube secured with: Tape Dental Injury: Teeth and Oropharynx as per pre-operative assessment

## 2021-05-24 NOTE — Op Note (Deleted)
70 year old female who is here today to discuss sacral colpopexy. She has a history of pelvic organ prolapse. This is been present for approximately 1 year. She has associated urinary urgency and associated urge incontinence. She also has leakage with stress maneuvers. She was seen and evaluated by Dr. Matilde Sprang, who noted that she had significant vault prolapse from the apex of the vagina. She was referred to me for further discussion and treatment.   The patient underwent urodynamics which demonstrated a fairly overactive bladder within normal bladder capacity and no significant evidence of stress incontinence when the bladder was reduced.   The patient had a hysterectomy 35 years ago. The patient had a partial nephrectomy in 2009, he had no evidence of recurrence. The patient has now had 4 separate back surgeries, and at this point is feeling significantly better.   The patient has some anxiety and asthma. She also has GERD. She has no history of hypertension or diabetes. She has tolerated anesthesia well in the past.   05/15/21: Patient presents today with concerns of a UTI. Her culture from her preoperative appointment at [facility] did grow Klebsiella. Upon entering the room the patient is toxic appearing with a respiratory rate of 32. She is very warm to touch and her fever is 101. 4 after Tylenol. I advised she proceed to the emergency department due to concern for sepsis. I advised she be taken there by EMS.     ALLERGIES: Codeine Derivatives - Trouble Breathing Morphine Derivatives - Swelling, "throat" Penicillins - Trouble Breathing Sulfa Drugs - Trouble Breathing    MEDICATIONS: Estradiol 1 mg tablet 1 tablet PO Daily  Furosemide 40 mg tablet Oral  Levothyroxine 50 mcg capsule 1 capsule PO Daily  Otezla 30 mg tablet 1 tablet PO BID  Protonix 40 mg tablet, delayed release 1 tablet PO Daily  Xanax 1 mg tablet Oral     GU PSH: Complex cystometrogram, w/ void pressure and urethral pressure  profile studies, any technique - 01/19/2021 Complex Uroflow - 01/19/2021 Emg surf Electrd - 01/19/2021 Hysterectomy Unilat SO - 2009 Inject For cystogram - 01/19/2021 Intrabd voidng Press - 01/19/2021 Partial nephrectomy (laparoscopic) - 2009       PSH Notes: Kidney Surgery Laparoscopic Partial Nephrectomy, Cholecystectomy Laparoscopic, Hysterectomy, Hand Surgery, Knee Surgery, Elbow Surgery, Back Surgery, Foot Repair   NON-GU PSH: Cholecystectomy (laparoscopic) - 2009     GU PMH: Cystocele, Unspec - 02/27/2021 Mixed incontinence - 02/27/2021, - 02/02/2021, - 12/20/2020 Urinary Frequency - 02/27/2021, - 02/02/2021, - 01/19/2021, - 12/20/2020 Nocturia - 12/20/2020 Kidney Cancer Unspec, except renal pelvis, Renal cell carcinoma, unspecified laterality - 2014 Neoplasm of unspecified behavior of unspecified kidney, Renal neoplasm - 2014    NON-GU PMH: Encounter for general adult medical examination without abnormal findings, Encounter for preventive health examination - 2015 Anxiety, Anxiety - 2014 Asthma, Asthma - 2014 Gastric ulcer, unspecified as acute or chronic, without hemorrhage or perforation, Gastric Ulcer - 2014 Personal history of other diseases of the digestive system, History of esophageal reflux - 2014 Personal history of other mental and behavioral disorders, History of depression - 2014 GERD    FAMILY HISTORY: Kidney Cancer - No Family History No pertinent family history - Other   SOCIAL HISTORY: Marital Status: Single Current Smoking Status: Patient does not smoke anymore. Has not smoked since 12/13/2005. Smoked for 10 years. Smoked 1/2 pack per day.   Tobacco Use Assessment Completed: Used Tobacco in last 30 days? Does not use smokeless tobacco. Has never drank.  Drinks 1 caffeinated drink per day.     Notes: Previous History Of Smoking, Marital History - Single, Occupation:, Alcohol Use   REVIEW OF SYSTEMS:    GU Review Female:   Patient reports frequent urination, hard to  postpone urination, and burning /pain with urination. Patient denies get up at night to urinate, leakage of urine, stream starts and stops, trouble starting your stream, have to strain to urinate, and being pregnant.  Gastrointestinal (Upper):   Patient reports nausea. Patient denies vomiting and indigestion/ heartburn.  Gastrointestinal (Lower):   Patient denies diarrhea and constipation.  Constitutional:   Patient reports fatigue. Patient denies fever, night sweats, and weight loss.  Skin:   Patient denies skin rash/ lesion and itching.  Eyes:   Patient denies blurred vision and double vision.  Ears/ Nose/ Throat:   Patient denies sore throat and sinus problems.  Hematologic/Lymphatic:   Patient denies swollen glands and easy bruising.  Cardiovascular:   Patient denies leg swelling and chest pains.  Respiratory:   Patient reports cough and shortness of breath.   Musculoskeletal:   Patient denies back pain and joint pain.  Neurological:   Patient denies headaches and dizziness.  Psychologic:   Patient denies depression and anxiety.   VITAL SIGNS:      05/15/2021 03:33 PM 05/15/2021 02:38 PM  BP   134/63 mmHg  Pulse   98 /min  Temperature 101.4 F / 38.5 C 97.1 F / 36.1 C   GU PHYSICAL EXAMINATION:    Vagina: Mild vaginal atrophy. Small rectocele. Large cystocele, midline. No stenosis. No enterocele.    MULTI-SYSTEM PHYSICAL EXAMINATION:    Constitutional: Thin. Patient is very warm to touch and toxic appearing  Cardiovascular: Warm extremities. Tachycardic  Neurologic / Psychiatric: She is oriented to person and place but has some mild confusion that is not normal for her     Complexity of Data:  Source Of History:  Patient, Medical Record Summary  Records Review:   Previous Hospital Records, Previous Patient Records  Urine Test Review:   Urinalysis  Urodynamics Review:   Review Bladder Scan   05/15/21  Urinalysis  Urine Appearance Cloudy   Urine Color Yellow   Urine Glucose Neg  mg/dL  Urine Bilirubin Neg mg/dL  Urine Ketones Neg mg/dL  Urine Specific Gravity 1.020   Urine Blood Neg ery/uL  Urine pH 6.0   Urine Protein 1+ mg/dL  Urine Urobilinogen 0.2 mg/dL  Urine Nitrites Positive   Urine Leukocyte Esterase 3+ leu/uL  Urine WBC/hpf 40 - 60/hpf   Urine RBC/hpf 3 - 10/hpf   Urine Epithelial Cells 6 - 10/hpf   Urine Bacteria Many (>50/hpf)   Urine Mucous Not Present   Urine Yeast NS (Not Seen)   Urine Trichomonas Not Present   Urine Cystals Amorph Urates   Urine Casts NS (Not Seen)   Urine Sperm Not Present    PROCEDURES:         PVR Ultrasound - [id number]  Scanned Volume: 233 cc        Simple Foley Catheterization - [id number]  A 16 French Foley catheter was inserted into the bladder using sterile technique. The patient was taught routine catheter care. A bedside bag was connected.         Urinalysis w/Scope Dipstick Dipstick Cont'd Micro  Color: Yellow Bilirubin: Neg mg/dL WBC/hpf: 40 - 60/hpf  Appearance: Cloudy Ketones: Neg mg/dL RBC/hpf: 3 - 10/hpf  Specific Gravity: 1.020 Blood: Neg ery/uL Bacteria: Many (>50/hpf)  pH:  6.0 Protein: 1+ mg/dL Cystals: Amorph Urates  Glucose: Neg mg/dL Urobilinogen: 0.2 mg/dL Casts: NS (Not Seen)    Nitrites: Positive Trichomonas: Not Present    Leukocyte Esterase: 3+ leu/uL Mucous: Not Present      Epithelial Cells: 6 - 10/hpf      Yeast: NS (Not Seen)      Sperm: Not Present    ASSESSMENT:      ICD-10 Details  1 NON-GU:   Fever, unspecified - R50.9 Acute, Systemic Symptoms   PLAN:           Orders Labs CULTURE, URINE          Document Letter(s):  Created for Patient: Clinical Summary         Notes:   Based off of her parents today I advised EMS take her to the emergency department. She was agreeable to this plan. EMS was given report along with the emergency department physician. Prior to leaving the patient requested a Foley catheter due to pain and pressure. This was placed in our office today.

## 2021-05-24 NOTE — Anesthesia Preprocedure Evaluation (Signed)
Anesthesia Evaluation  Patient identified by MRN, date of birth, ID band Patient awake    Reviewed: Allergy & Precautions, NPO status , Patient's Chart, lab work & pertinent test results  History of Anesthesia Complications (+) PONV and history of anesthetic complications  Airway Mallampati: II  TM Distance: >3 FB Neck ROM: Full    Dental no notable dental hx.    Pulmonary asthma , COPD,    Pulmonary exam normal breath sounds clear to auscultation       Cardiovascular negative cardio ROS Normal cardiovascular exam Rhythm:Regular Rate:Normal     Neuro/Psych  Headaches,    GI/Hepatic Neg liver ROS, GERD  ,  Endo/Other  Hypothyroidism   Renal/GU negative Renal ROS  negative genitourinary   Musculoskeletal negative musculoskeletal ROS (+)   Abdominal   Peds negative pediatric ROS (+)  Hematology negative hematology ROS (+)   Anesthesia Other Findings   Reproductive/Obstetrics negative OB ROS                             Anesthesia Physical  Anesthesia Plan  ASA: 2  Anesthesia Plan: General   Post-op Pain Management:    Induction: Intravenous  PONV Risk Score and Plan: 4 or greater and Midazolam, Treatment may vary due to age or medical condition, Ondansetron, Dexamethasone and Droperidol  Airway Management Planned: Oral ETT  Additional Equipment:   Intra-op Plan:   Post-operative Plan: Extubation in OR  Informed Consent: I have reviewed the patients History and Physical, chart, labs and discussed the procedure including the risks, benefits and alternatives for the proposed anesthesia with the patient or authorized representative who has indicated his/her understanding and acceptance.     Dental advisory given  Plan Discussed with: CRNA and Anesthesiologist  Anesthesia Plan Comments: ( )        Anesthesia Quick Evaluation

## 2021-05-25 ENCOUNTER — Encounter (HOSPITAL_COMMUNITY): Payer: Self-pay | Admitting: Urology

## 2021-05-25 LAB — BASIC METABOLIC PANEL
Anion gap: 12 (ref 5–15)
BUN: 7 mg/dL — ABNORMAL LOW (ref 8–23)
CO2: 27 mmol/L (ref 22–32)
Calcium: 8.5 mg/dL — ABNORMAL LOW (ref 8.9–10.3)
Chloride: 101 mmol/L (ref 98–111)
Creatinine, Ser: 0.7 mg/dL (ref 0.44–1.00)
GFR, Estimated: >60 mL/min (ref >60)
Glucose, Bld: 83 mg/dL (ref 70–99)
Potassium: 4.2 mmol/L (ref 3.5–5.1)
Sodium: 140 mmol/L (ref 135–145)

## 2021-05-25 LAB — HEMOGLOBIN AND HEMATOCRIT, BLOOD
HCT: 31.3 % — ABNORMAL LOW (ref 36.0–46.0)
Hemoglobin: 9.4 g/dL — ABNORMAL LOW (ref 12.0–15.0)

## 2021-05-25 MED ORDER — HYDROMORPHONE HCL 1 MG/ML IJ SOLN
0.5000 mg | Freq: Once | INTRAMUSCULAR | Status: DC
Start: 2021-05-25 — End: 2021-05-25

## 2021-05-25 MED ORDER — HYDROMORPHONE HCL 1 MG/ML IJ SOLN: 0.5000 mg | Freq: Four times a day (QID) | INTRAMUSCULAR | Status: DC | PRN

## 2021-05-25 MED ORDER — TRAMADOL HCL 50 MG PO TABS
100.0000 mg | ORAL_TABLET | Freq: Four times a day (QID) | ORAL | Status: DC | PRN
Start: 2021-05-25 — End: 2021-05-26

## 2021-05-25 NOTE — Progress Notes (Signed)
Urology Progress Note   1 Day Post-Op from internalization of left nephrostomy tube into ureteral stent, robotic sacrocolpopexy.   Subjective: NAEON.  She received a dose of tramadol yesterday evening. Vaginal packing and Foley catheter removed this morning.  Vital signs have been stable.   A 2.75 L of urine.  Her hemoglobin is stable at 9.4.  Her creatinine is stable at 0.7.  Continues on IV meropenem.  Objective: Vital signs in last 24 hours: Temp:  [97.7 F (36.5 C)-98.9 F (37.2 C)] 98 F (36.7 C) (08/11 0435) Pulse Rate:  [72-96] 88 (08/11 0435) Resp:  [14-19] 16 (08/11 0435) BP: (113-133)/(56-79) 128/66 (08/11 0435) SpO2:  [93 %-99 %] 95 % (08/11 0435)  Intake/Output from previous day: 08/10 0701 - 08/11 0700 In: 2996.3 [I.V.:1846.3; IV Piggyback:1150] Out: 2800 [Urine:2750; Blood:50] Intake/Output this shift: Total I/O In: 1446.3 [I.V.:546.3; IV Piggyback:900] Out: 2400 [Urine:2400]  Physical Exam:  General: Alert and oriented CV: Regular rate Lungs: No increased work of breathing Abdomen:  Soft, appropriately tender. Incisions c/d/i.  GU: Voiding spontaneously. Ext: NT, No erythema  Lab Results: Recent Labs    05/24/21 0517 05/24/21 1705 05/25/21 0503  HGB 9.0* 9.2* 9.4*  HCT 28.9* 30.1* 31.3*   Recent Labs    05/23/21 0453 05/25/21 0503  NA 140 140  K 3.2* 4.2  CL 100 101  CO2 30 27  GLUCOSE 99 83  BUN 7* 7*  CREATININE 0.76 0.70  CALCIUM 8.1* 8.5*    Studies/Results: DG C-Arm 1-60 Min-No Report  Result Date: 05/24/2021 Fluoroscopy was utilized by the requesting physician.  No radiographic interpretation.    Assessment/Plan:  70 y.o. female s/p internalization of left nephrostomy tube into ureteral stent, robotic sacrocolpopexy.  She now has bilateral ureteral stents.  Admitted (as per previous notes) for treatment of urosepsis and decompression of her upper urinary tracts prior to her planned procedure which was performed yesterday.  Overall  doing well post-op.   -We will normalize the patient today by discontinuing IV fluids and IV narcotics.  She is currently trial voiding.  Vaginal packing has been removed -Ambulate and incentive spirometry -Patient will be discharged on ciprofloxacin.  She has an appointment scheduled for 06/07/2021 for ureteral stent removal and postop check  Dispo: Likely discharge today   LOS: 9 days

## 2021-05-25 NOTE — Progress Notes (Signed)
PROGRESS NOTE    Theresa Holloway  [id number] DOB: [birth year] DOA: 05/15/2021 PCP: Sharilyn Sites, MD    Brief Narrative:  70 year old female with history of COPD, asthma, anxiety, history of partial nephrectomy for left-sided renal cancer presented to the emergency room with fever, dysuria and hematuria.  Patient has been suffering from bladder prolapse and followed up with urology with planned sling surgery on 8/10.  Patient had frequency and dysuria and was given Foley catheter on urology visit on 8/1.  Urine culture taken on 7/28 for preop eval was positive for ESBL E. coli.  Blood cultures on 8/1 positive for Klebsiella.  Urine culture also with Klebsiella on repeat exam. Urine culture from office on 7/28 positive for ESBL E. coli. With persistent elevated white count found to have bilateral severe hydronephrosis. Cystoscopy on 8/4, stenting right ureter.  Left ureter unable to stent.  IR DID PERCUTANEOUS NEPHROSTOMY ON THE LEFT SIDE 8/5.   Outpatient urine culture and sensitivity results with ESBL. Copy and pasted here. Escherichia coli ESBL Antimicrobic/Dose MIC SYSTEMIC URINE --------------------------------------------------------------- Amp/Sulbactam 16/8 I I Amikacin <16 S S Ampicillin >16 R* R* Amox/K Clav <8/4 S S Aztreonam >16 ESBL ESBL Ceftriaxone >32 ESBL ESBL Ceftazidime >16 ESBL ESBL Cefotaxime >32 ESBL ESBL Cefazolin >16 R* R* Ciprofloxacin >2 R R Cefepime >16 R* R* Cefotetan <16 S S Ertapenem <0.5 S S Nitrofurantoin <32 S Gentamicin <4 S S Levofloxacin >4 R R Meropenem <1 S S Pip/Tazo <16 S S Trimeth/Sulfa <2/38 S S Tetracycline <4 S S Tobramycin <4 S S  Assessment & Plan:   Principal Problem:   Sepsis secondary to UTI [facility]) Active Problems:   Sepsis ([place])   Hypokalemia   AKI (acute kidney injury) ([place])   Bladder prolapse, female, acquired  Sepsis secondary to ESBL E. coli UTI, Klebsiella UTI and Klebsiella bacteremia: Bilateral severe  hydronephrosis Urinary bladder prolapse status post surgical correction. Acute kidney injury: Improved. Electrolyte abnormalities improved.  Postop day 1 surgery.  Clinically stabilizing.  Medical issues improved. Discussed with urology surgery, care transferred to urology service.  We will sign off.  She is medically stable to discharge when surgically ready.   DVT prophylaxis: SCDs Start: 05/24/21 1730 Place and maintain sequential compression device Start: 05/18/21 1146 SCD   Code Status: Full code Family Communication: Patient's friend Juliann Pulse at the bedside. Disposition Plan: Status is: Inpatient  Remains inpatient appropriate because:Inpatient level of care appropriate due to severity of illness  Dispo: The patient is from: Home              Anticipated d/c is to: Home.  Anticipate tomorrow.              Patient currently is medically stable.   Difficult to place patient No         Consultants:  Urology Interventional radiology  Procedures:  None  Antimicrobials:  Ancef 8/1--- 8/4 Gentamicin 8/4 Meropenem 8/5----   Subjective: Seen and examined.  Minimal pain.  She went to bathroom after packing removal.  She was expecting something to come out and was very happy to see nothing prolapsed.  Friend at the bedside.  She is planning to go home tomorrow.  Objective: Vitals:   05/24/21 2026 05/25/21 0435 05/25/21 0748 05/25/21 0752  BP: 133/66 128/66    Pulse: 96 88    Resp: 19 16    Temp: 98.8 F (37.1 C) 98 F (36.7 C)    TempSrc:  Oral    SpO2: 97%  95% 94% 94%  Weight:      Height:        Intake/Output Summary (Last 24 hours) at 05/25/2021 1324 Last data filed at 05/25/2021 0530 Gross per 24 hour  Intake 2996.25 ml  Output 2450 ml  Net 546.25 ml   Filed Weights   05/15/21 1617 05/18/21 1544  Weight: 56.9 kg 56.9 kg    Examination:  General: Looks fairly comfortable.  Cardiovascular: S1-S2 normal.  No added sounds. Respiratory: Bilateral  clear.  No added sounds. Gastrointestinal: Soft and nontender.  Bowel sounds present. Ext: No swelling or edema.  No cyanosis. Neuro: Alert oriented x4.  No focal deficits.    Data Reviewed: I have personally reviewed following labs and imaging studies  CBC: Recent Labs  Lab 05/20/21 0557 05/21/21 0530 05/22/21 0438 05/23/21 0453 05/24/21 0517 05/24/21 1705 05/25/21 0503  WBC 19.1* 15.8* 14.9* 12.1* 10.8*  --   --   NEUTROABS 15.4* 12.1* 11.2* 8.4* 7.2  --   --   HGB 9.6* 8.9* 9.1* 9.3* 9.0* 9.2* 9.4*  HCT 30.4* 28.2* 28.8* 29.9* 28.9* 30.1* 31.3*  MCV 87.4 86.5 88.9 88.2 88.7  --   --   PLT 758* 660* 618* 635* 554*  --   --    Basic Metabolic Panel: Recent Labs  Lab 05/19/21 0509 05/20/21 0557 05/21/21 0530 05/22/21 0438 05/23/21 0453 05/25/21 0503  NA 138 135 138 139 140 140  K 3.9 3.0* 3.1* 3.7 3.2* 4.2  CL 110 106 106 103 100 101  CO2 16* 21* '24 27 30 27  '$ GLUCOSE 134* 96 105* 102* 99 83  BUN '19 16 9 8 '$ 7* 7*  CREATININE 1.88* 1.21* 0.77 0.69 0.76 0.70  CALCIUM 8.5* 8.1* 8.1* 7.9* 8.1* 8.5*  MG 1.9  --   --   --   --   --   PHOS 3.8  --   --   --   --   --    GFR: Estimated Creatinine Clearance: 60.5 mL/min (by C-G formula based on SCr of 0.7 mg/dL). Liver Function Tests: No results for input(s): AST, ALT, ALKPHOS, BILITOT, PROT, ALBUMIN in the last 168 hours.  No results for input(s): LIPASE, AMYLASE in the last 168 hours. No results for input(s): AMMONIA in the last 168 hours. Coagulation Profile: Recent Labs  Lab 05/18/21 1945  INR 1.2   Cardiac Enzymes: No results for input(s): CKTOTAL, CKMB, CKMBINDEX, TROPONINI in the last 168 hours. BNP (last 3 results) No results for input(s): PROBNP in the last 8760 hours. HbA1C: No results for input(s): HGBA1C in the last 72 hours. CBG: No results for input(s): GLUCAP in the last 168 hours. Lipid Profile: No results for input(s): CHOL, HDL, LDLCALC, TRIG, CHOLHDL, LDLDIRECT in the last 72 hours. Thyroid  Function Tests: No results for input(s): TSH, T4TOTAL, FREET4, T3FREE, THYROIDAB in the last 72 hours. Anemia Panel: No results for input(s): VITAMINB12, FOLATE, FERRITIN, TIBC, IRON, RETICCTPCT in the last 72 hours. Sepsis Labs: No results for input(s): PROCALCITON, LATICACIDVEN in the last 168 hours.   Recent Results (from the past 240 hour(s))  Blood culture (routine single)     Status: Abnormal   Collection Time: 05/15/21  5:10 PM   Specimen: BLOOD  Result Value Ref Range Status   Specimen Description   Final    BLOOD RIGHT ANTECUBITAL Performed at [place] [address], [place], [place] [address]    Special Requests   Final    BOTTLES  DRAWN AEROBIC AND ANAEROBIC Blood Culture results may not be optimal due to an excessive volume of blood received in culture bottles Performed at [facility], [place] [address], [place], [place] [address]    Culture  Setup Time   Final    GRAM NEGATIVE RODS ANAEROBIC BOTTLE ONLY CRITICAL RESULT CALLED TO, READ BACK BY AND VERIFIED WITH: PHARMD J.GADHIA AT [id number] ON 05/16/2021 BY T.SAAD. Performed at [facility], [place] [address], [place], [place] [address]    Culture KLEBSIELLA PNEUMONIAE (A)  Final   Report Status 05/18/2021 FINAL  Final   Organism ID, Bacteria KLEBSIELLA PNEUMONIAE  Final      Susceptibility   Klebsiella pneumoniae - MIC*    AMPICILLIN >=32 RESISTANT Resistant     CEFAZOLIN <=4 SENSITIVE Sensitive     CEFEPIME <=0.12 SENSITIVE Sensitive     CEFTAZIDIME <=1 SENSITIVE Sensitive     CEFTRIAXONE <=0.25 SENSITIVE Sensitive     CIPROFLOXACIN <=0.25 SENSITIVE Sensitive     GENTAMICIN <=1 SENSITIVE Sensitive     IMIPENEM <=0.25 SENSITIVE Sensitive     TRIMETH/SULFA <=20 SENSITIVE Sensitive     AMPICILLIN/SULBACTAM 4 SENSITIVE Sensitive     PIP/TAZO <=4 SENSITIVE Sensitive     * KLEBSIELLA PNEUMONIAE  Urine Culture     Status: Abnormal   Collection Time: 05/15/21  5:10 PM    Specimen: In/Out Cath Urine  Result Value Ref Range Status   Specimen Description   Final    IN/OUT CATH URINE Performed at [place] [address], [place], [place] [address]    Special Requests   Final    NONE Performed at [facility], [place] [address], [place], [place] [address]    Culture >=100,000 COLONIES/mL KLEBSIELLA PNEUMONIAE (A)  Final   Report Status 05/18/2021 FINAL  Final   Organism ID, Bacteria KLEBSIELLA PNEUMONIAE (A)  Final      Susceptibility   Klebsiella pneumoniae - MIC*    AMPICILLIN >=32 RESISTANT Resistant     CEFAZOLIN <=4 SENSITIVE Sensitive     CEFEPIME <=0.12 SENSITIVE Sensitive     CEFTRIAXONE <=0.25 SENSITIVE Sensitive     CIPROFLOXACIN <=0.25 SENSITIVE Sensitive     GENTAMICIN <=1 SENSITIVE Sensitive     IMIPENEM <=0.25 SENSITIVE Sensitive     NITROFURANTOIN 64 INTERMEDIATE Intermediate     TRIMETH/SULFA <=20 SENSITIVE Sensitive     AMPICILLIN/SULBACTAM 8 SENSITIVE Sensitive     PIP/TAZO <=4 SENSITIVE Sensitive     * >=100,000 COLONIES/mL KLEBSIELLA PNEUMONIAE  Resp Panel by RT-PCR (Flu A&B, Covid)     Status: None   Collection Time: 05/15/21  5:10 PM   Specimen: Nasopharyngeal(NP) swabs in vial transport medium  Result Value Ref Range Status   SARS Coronavirus 2 by RT PCR NEGATIVE NEGATIVE Final    Comment: (NOTE) SARS-CoV-2 target nucleic acids are NOT DETECTED.  The SARS-CoV-2 RNA is generally detectable in upper respiratory specimens during the acute phase of infection. The lowest concentration of SARS-CoV-2 viral copies this assay can detect is 138 copies/mL. A negative result does not preclude SARS-Cov-2 infection and should not be used as the sole basis for treatment or other patient management decisions. A negative result may occur with  improper specimen collection/handling, submission of specimen other than nasopharyngeal swab, presence of viral mutation(s) within the areas targeted by this  assay, and inadequate number of viral copies(<138 copies/mL). A negative result must be combined with clinical observations, patient history, and epidemiological information.  The expected result is Negative.  Fact Sheet for Patients:  EntrepreneurPulse.com.au  Fact Sheet for Healthcare Providers:  IncredibleEmployment.be  This test is no t yet approved or cleared by the [place] FDA and  has been authorized for detection and/or diagnosis of SARS-CoV-2 by FDA under an Emergency Use Authorization (EUA). This EUA will remain  in effect (meaning this test can be used) for the duration of the COVID-19 declaration under Section 564(b)(1) of the Act, 21 U.S.C.section 360bbb-3(b)(1), unless the authorization is terminated  or revoked sooner.       Influenza A by PCR NEGATIVE NEGATIVE Final   Influenza B by PCR NEGATIVE NEGATIVE Final    Comment: (NOTE) The Xpert Xpress SARS-CoV-2/FLU/RSV plus assay is intended as an aid in the diagnosis of influenza from Nasopharyngeal swab specimens and should not be used as a sole basis for treatment. Nasal washings and aspirates are unacceptable for Xpert Xpress SARS-CoV-2/FLU/RSV testing.  Fact Sheet for Patients: EntrepreneurPulse.com.au  Fact Sheet for Healthcare Providers: IncredibleEmployment.be  This test is not yet approved or cleared by the [place] FDA and has been authorized for detection and/or diagnosis of SARS-CoV-2 by FDA under an Emergency Use Authorization (EUA). This EUA will remain in effect (meaning this test can be used) for the duration of the COVID-19 declaration under Section 564(b)(1) of the Act, 21 U.S.C. section 360bbb-3(b)(1), unless the authorization is terminated or revoked.  Performed at [facility], [place] [address], [place], [place] [address]   Blood Culture ID Panel (Reflexed)     Status: Abnormal   Collection  Time: 05/15/21  5:10 PM  Result Value Ref Range Status   Enterococcus faecalis NOT DETECTED NOT DETECTED Final   Enterococcus Faecium NOT DETECTED NOT DETECTED Final   Listeria monocytogenes NOT DETECTED NOT DETECTED Final   Staphylococcus species NOT DETECTED NOT DETECTED Final   Staphylococcus aureus (BCID) NOT DETECTED NOT DETECTED Final   Staphylococcus epidermidis NOT DETECTED NOT DETECTED Final   Staphylococcus lugdunensis NOT DETECTED NOT DETECTED Final   Streptococcus species NOT DETECTED NOT DETECTED Final   Streptococcus agalactiae NOT DETECTED NOT DETECTED Final   Streptococcus pneumoniae NOT DETECTED NOT DETECTED Final   Streptococcus pyogenes NOT DETECTED NOT DETECTED Final   A.calcoaceticus-baumannii NOT DETECTED NOT DETECTED Final   Bacteroides fragilis NOT DETECTED NOT DETECTED Final   Enterobacterales DETECTED (A) NOT DETECTED Final    Comment: Enterobacterales represent a large order of gram negative bacteria, not a single organism. CRITICAL RESULT CALLED TO, READ BACK BY AND VERIFIED WITH: PHARMD J.GADHIA AT [id number] ON 05/16/2021 BY T.SAAD.    Enterobacter cloacae complex NOT DETECTED NOT DETECTED Final   Escherichia coli NOT DETECTED NOT DETECTED Final   Klebsiella aerogenes NOT DETECTED NOT DETECTED Final   Klebsiella oxytoca NOT DETECTED NOT DETECTED Final   Klebsiella pneumoniae DETECTED (A) NOT DETECTED Final    Comment: CRITICAL RESULT CALLED TO, READ BACK BY AND VERIFIED WITH: PHARMD J.GADHIA AT 1259 ON 05/16/2021 BY T.SAAD.    Proteus species NOT DETECTED NOT DETECTED Final   Salmonella species NOT DETECTED NOT DETECTED Final   Serratia marcescens NOT DETECTED NOT DETECTED Final   Haemophilus influenzae NOT DETECTED NOT DETECTED Final   Neisseria meningitidis NOT DETECTED NOT DETECTED Final   Pseudomonas aeruginosa NOT DETECTED NOT DETECTED Final   Stenotrophomonas maltophilia NOT DETECTED NOT DETECTED Final   Candida albicans NOT DETECTED NOT DETECTED Final    Candida auris NOT DETECTED NOT DETECTED Final   Candida glabrata  NOT DETECTED NOT DETECTED Final   Candida krusei NOT DETECTED NOT DETECTED Final   Candida parapsilosis NOT DETECTED NOT DETECTED Final   Candida tropicalis NOT DETECTED NOT DETECTED Final   Cryptococcus neoformans/gattii NOT DETECTED NOT DETECTED Final   CTX-M ESBL NOT DETECTED NOT DETECTED Final   Carbapenem resistance IMP NOT DETECTED NOT DETECTED Final   Carbapenem resistance KPC NOT DETECTED NOT DETECTED Final   Carbapenem resistance NDM NOT DETECTED NOT DETECTED Final   Carbapenem resist OXA 48 LIKE NOT DETECTED NOT DETECTED Final   Carbapenem resistance VIM NOT DETECTED NOT DETECTED Final    Comment: Performed at [facility], [place] [address], [place], [place] [address]  Culture, blood (routine x 2)     Status: None   Collection Time: 05/17/21 11:50 AM   Specimen: BLOOD  Result Value Ref Range Status   Specimen Description   Final    BLOOD RIGHT ANTECUBITAL Performed at [place] [address], [place], [place] [address]    Special Requests   Final    BOTTLES DRAWN AEROBIC AND ANAEROBIC BCLV Performed at [place] [address], [place], [place] [address]    Culture   Final    NO GROWTH 6 DAYS Performed at [facility], [place] [address], [place], [place] [address]    Report Status 05/23/2021 FINAL  Final  Culture, blood (routine x 2)     Status: None   Collection Time: 05/17/21 11:50 AM   Specimen: BLOOD  Result Value Ref Range Status   Specimen Description   Final    BLOOD RT WRIST Performed at [place] [address], [place], [place] [address]    Special Requests   Final    BOTTLES DRAWN AEROBIC AND ANAEROBIC Blood Culture results may not be optimal due to an inadequate volume of blood received in culture bottles Performed at [place] [address] [place], [place] [address]    Culture   Final     NO GROWTH 6 DAYS Performed at [facility], [place] [address] [place], [place] [address]    Report Status 05/23/2021 FINAL  Final         Radiology Studies: DG C-Arm 1-60 Min-No Report  Result Date: 05/24/2021 Fluoroscopy was utilized by the requesting physician.  No radiographic interpretation.        Scheduled Meds:  ALPRAZolam  1 mg Oral QHS   estradiol  1 mg Oral Daily   ipratropium  1 spray Each Nare Daily   liothyronine  25 mcg Oral Daily   loratadine  10 mg Oral Daily   mometasone-formoterol  2 puff Inhalation BID   montelukast  10 mg Oral QHS   pantoprazole  40 mg Oral Daily   polyethylene glycol  17 g Oral Daily   Continuous Infusions:  meropenem (MERREM) IV 1 g (05/25/21 1131)     LOS: 9 days    Time spent: 20 minutes    Barb Merino, MD [facility] Pager [phone]

## 2021-05-25 NOTE — Discharge Summary (Addendum)
[facility] Discharge Summary  Admit date: 05/15/2021  Discharge date and time: 05/26/21   Discharge to: Home  Discharge Service: Urology  Discharge Attending Physician: Burman Nieves, MD  Discharge  Diagnoses: Sepsis secondary to UTI [facility])  Secondary Diagnosis: Principal Problem:   Sepsis secondary to UTI [facility]) Active Problems:   Sepsis ([place])   Hypokalemia   AKI (acute kidney injury) ([place])   Bladder prolapse, female, acquired   OR Procedures: Procedure(s): XI ROBOTIC ASSISTED LAPAROSCOPIC SACROCOLPOPEXY CYSTOSCOPY WITH RETROGRADE PYELOGRAM/URETERAL STENT PLACEMENT 05/24/2021   Ancillary Procedures: None   Discharge Day Services: The patient was seen and examined by the Urology team both in the morning and immediately prior to discharge.  Vital signs and laboratory values were stable and within normal limits.  The physical exam was benign and unchanged and all surgical wounds were examined.  Discharge instructions were explained and all questions answered.  Subjective  No acute events overnight. Pain Controlled. No fever or chills.  Objective Patient Vitals for the past 8 hrs:  BP Temp Pulse Resp SpO2  05/26/21 0856 -- -- -- -- 95 %  05/26/21 0840 -- -- -- -- 93 %  05/26/21 0546 (!) 103/91 98.1 F (36.7 C) 83 20 93 %   No intake/output data recorded.  General Appearance:        No acute distress Lungs:                       Normal work of breathing on room air Heart:                                Regular rate and rhythm Abdomen:                         Soft, non-tender, non-distended.  Incisions clean dry intact.  Voiding spontaneously. Extremities:                      Warm and well perfused   Hospital Course:  History of severe cystocele and pelvic organ prolapse.  Plan sacrocolpopexy on 05/24/2021.  She was admitted on 05/15/2021 with urosepsis and found to have bilaterally obstructed urinary systems.  A right ureteral stent was placed.  A left nephrostomy tube  was placed due to difficulty placing the left ureteral stent.  She was treated with meropenem.  Urine culture revealed Klebsiella.  She improved. The patient underwent robotic sacrocolpopexy and internalization of left nephrostomy tube into left ureteral stent on 05/24/2021.  The patient tolerated the procedure well, was extubated in the OR, and afterwards was taken to the PACU for routine post-surgical care. When stable the patient was transferred to the floor.   The patient did well postoperatively.  The patient's diet was slowly advanced and at the time of discharge was tolerating a regular diet.  The patient was discharged home 2 Days Post-Op, at which point was tolerating a regular solid diet, was able to void spontaneously, have adequate pain control with P.O. pain medication, and could ambulate without difficulty. The patient will follow up with [place] for post op check.   She will have her ureteral stents removed in clinic.  She will be discharged with 2 doses of fosfomycin to complete a complicated UTI course.  Condition at Discharge: Improved  Discharge Medications:  Allergies as of 05/26/2021       Reactions  Codeine Anaphylaxis   Morphine And Related Anaphylaxis   Penicillins Anaphylaxis   Sulfa Antibiotics Anaphylaxis   Other    Cat gut suture - too long to heal    Prednisone    Excessive weight gain - 35lbs in 10 days   Skyrizi [risankizumab] Nausea And Vomiting   Pt also got blisters when taking this med        Medication List     STOP taking these medications    azithromycin 250 MG tablet Commonly known as: ZITHROMAX   Otezla 10 & 20 & 30 MG Tbpk Generic drug: Apremilast       TAKE these medications    acetaminophen 500 MG tablet Commonly known as: TYLENOL Take 1,000 mg by mouth every 6 (six) hours as needed for moderate pain.   acyclovir 800 MG tablet Commonly known as: ZOVIRAX Take 800 mg by mouth 5 (five) times daily as needed (shingles).   ALPRAZolam 1  MG tablet Commonly known as: XANAX Take 1 mg by mouth at bedtime.   cetirizine 10 MG tablet Commonly known as: ZYRTEC Take 10 mg by mouth daily.   docusate sodium 100 MG capsule Commonly known as: COLACE Take 1 capsule (100 mg total) by mouth 2 (two) times daily.   estradiol 0.1 MG/GM vaginal cream Commonly known as: ESTRACE VAGINAL Place 1 Applicatorful vaginally 3 (three) times a week. Use 1 small dolyp of cream on tip of index finger and swap the inside of the vagina   estradiol 1 MG tablet Commonly known as: ESTRACE Take 1 tablet (1 mg total) by mouth daily.   fluticasone 50 MCG/ACT nasal spray Commonly known as: FLONASE Place 1 spray into both nostrils in the morning and at bedtime.   fosfomycin 3 g Pack Commonly known as: MONUROL Take 3 g by mouth every 3 (three) days for 2 doses. Start taking on: May 27, 2021   ipratropium 0.03 % nasal spray Commonly known as: ATROVENT Place 1 spray into both nostrils daily.   liothyronine 25 MCG tablet Commonly known as: CYTOMEL Take 25 mcg by mouth daily.   meclizine 25 MG tablet Commonly known as: ANTIVERT Take 25 mg by mouth 3 (three) times daily as needed for dizziness.   montelukast 10 MG tablet Commonly known as: SINGULAIR Take 10 mg by mouth at bedtime.   ondansetron 4 MG tablet Commonly known as: Zofran Take 1 tablet (4 mg total) by mouth every 8 (eight) hours as needed for up to 7 days for nausea or vomiting.   pantoprazole 40 MG tablet Commonly known as: PROTONIX Take 40 mg by mouth daily.   Restasis 0.05 % ophthalmic emulsion Generic drug: cycloSPORINE Place 1 drop into both eyes 2 (two) times daily.   Symbicort 160-4.5 MCG/ACT inhaler Generic drug: budesonide-formoterol Inhale 2 puffs into the lungs 2 (two) times daily as needed (wheezing).   traMADol 50 MG tablet Commonly known as: Ultram Take 1-2 tablets (50-100 mg total) by mouth every 6 (six) hours as needed for moderate pain or severe pain.    TruBiotics Caps Take 1 capsule by mouth daily.       ASK your doctor about these medications    Otezla 30 MG Tabs Generic drug: Apremilast TAKE 1 TABLET BY MOUTH 2 TIMES A DAY.

## 2021-05-25 NOTE — Evaluation (Signed)
Physical Therapy Evaluation Patient Details Name: Theresa Holloway MRN: [id number] DOB: 05-07-1951 Today's Date: 05/25/2021   History of Present Illness  70 year old female with history of COPD, asthma, anxiety, history of partial nephrectomy for left-sided renal cancer presented to the emergency room with fever, dysuria and hematuria.  Patient has been suffering from bladder prolapse. S/p laparoscopic sacrocolpopexy, Cystoscopy, left retrograde pyelogram with interpretation, Left ureteral stent placement, and Left nephrostomy tube removal on 05/24/21.   Clinical Impression  Pt is a 70 y.o. female with above HPI. Pt is currently independent with all mobility without use of assistive device and at safe mobility level for d/c home. No skilled PT needs identified at this time. PT to delist. Please reconsult if mobility status changes.     Follow Up Recommendations No PT follow up    Equipment Recommendations  None recommended by PT    Recommendations for Other Services       Precautions / Restrictions Precautions Precautions: None Restrictions Weight Bearing Restrictions: No      Mobility  Bed Mobility Overal bed mobility: Independent                  Transfers Overall transfer level: Independent Equipment used: None                Ambulation/Gait Ambulation/Gait assistance: Independent Gait Distance (Feet): 280 Feet Assistive device: None   Gait velocity: WNL   General Gait Details: pt ambulated ~21f independently without assistive device  Stairs            Wheelchair Mobility    Modified Rankin (Stroke Patients Only)       Balance Overall balance assessment: No apparent balance deficits (not formally assessed)                                           Pertinent Vitals/Pain Pain Assessment: Faces Faces Pain Scale: Hurts a little bit Pain Location: abdomen Pain Descriptors / Indicators: Discomfort;Sore Pain  Intervention(s): Limited activity within patient's tolerance;Monitored during session;Repositioned    Home Living Family/patient expects to be discharged to:: Private residence Living Arrangements: Alone Available Help at Discharge: Family Type of Home: House Home Access: Level entry     Home Layout: One level Home Equipment: WEnvironmental consultant- 2 wheels;Cane - single point;Shower seat      Prior Function Level of Independence: Independent         Comments: denies history of falls     Hand Dominance        Extremity/Trunk Assessment   Upper Extremity Assessment Upper Extremity Assessment: Overall WFL for tasks assessed    Lower Extremity Assessment Lower Extremity Assessment: Overall WFL for tasks assessed    Cervical / Trunk Assessment Cervical / Trunk Assessment: Normal  Communication   Communication: No difficulties  Cognition Arousal/Alertness: Awake/alert Behavior During Therapy: WFL for tasks assessed/performed Overall Cognitive Status: Within Functional Limits for tasks assessed                                        General Comments      Exercises     Assessment/Plan    PT Assessment Patent does not need any further PT services  PT Problem List         PT Treatment Interventions  PT Goals (Current goals can be found in the Care Plan section)  Acute Rehab PT Goals Patient Stated Goal: go home, get back to routine PT Goal Formulation: With patient Time For Goal Achievement: 06/08/21 Potential to Achieve Goals: Good    Frequency     Barriers to discharge        Co-evaluation               AM-PAC PT "6 Clicks" Mobility  Outcome Measure Help needed turning from your back to your side while in a flat bed without using bedrails?: None Help needed moving from lying on your back to sitting on the side of a flat bed without using bedrails?: None Help needed moving to and from a bed to a chair (including a wheelchair)?:  None Help needed standing up from a chair using your arms (e.g., wheelchair or bedside chair)?: None Help needed to walk in hospital room?: None Help needed climbing 3-5 steps with a railing? : None 6 Click Score: 24    End of Session Equipment Utilized During Treatment: Gait belt Activity Tolerance: Patient tolerated treatment well Patient left: in bed;with call bell/phone within reach Nurse Communication: Mobility status PT Visit Diagnosis: Unsteadiness on feet (R26.81)    Time: [id number] PT Time Calculation (min) (ACUTE ONLY): 14 min   Charges:   PT Evaluation $PT Eval Low Complexity: 1 Low          Festus Barren PT, DPT  Acute Rehabilitation Services  Office [phone]  05/25/2021, 5:56 PM

## 2021-05-25 NOTE — Progress Notes (Signed)
Foley dc'd and virginal packing removed. Pt tolerated well

## 2021-05-26 LAB — SURGICAL PATHOLOGY

## 2021-05-26 MED ORDER — FOSFOMYCIN TROMETHAMINE 3 G PO PACK: 3.0000 g | PACK | ORAL | 0 refills | Status: AC

## 2021-05-26 MED ORDER — ONDANSETRON HCL 4 MG PO TABS: 4.0000 mg | ORAL_TABLET | Freq: Three times a day (TID) | ORAL | 0 refills | Status: AC | PRN

## 2021-05-26 MED ORDER — ESTRADIOL 0.1 MG/GM VA CREA: 1.0000 | TOPICAL_CREAM | VAGINAL | 1 refills | Status: DC

## 2021-05-26 NOTE — Plan of Care (Signed)

## 2021-06-12 ENCOUNTER — Telehealth: Payer: Self-pay | Admitting: *Deleted

## 2021-06-12 NOTE — Telephone Encounter (Signed)
Phone call to patient to let her know we need a new office visit so we can do her prior authorization for her [facility]. Made her appointment on 07/27/21 with Dr. Denna Haggard.

## 2021-06-29 NOTE — Telephone Encounter (Signed)
Started PA for otezla she don't need a TB test just keep her October visit. PA code [phone] for Rosie Fate [id number] Rosann Auerbach stated the determination will be faxed to [place]

## 2021-06-30 ENCOUNTER — Other Ambulatory Visit: Payer: Self-pay | Admitting: Family Medicine

## 2021-06-30 DIAGNOSIS — Z1231 Encounter for screening mammogram for malignant neoplasm of breast: Secondary | ICD-10-CM

## 2021-07-03 ENCOUNTER — Telehealth: Payer: Self-pay | Admitting: *Deleted

## 2021-07-03 NOTE — Telephone Encounter (Signed)
Prior Authorization approved for [facility] 06/29/2021- 06/29/2022. PA # [id number]

## 2021-07-10 ENCOUNTER — Other Ambulatory Visit: Payer: Self-pay | Admitting: Dermatology

## 2021-07-20 ENCOUNTER — Other Ambulatory Visit: Payer: Self-pay | Admitting: Adult Health

## 2021-07-20 DIAGNOSIS — D49511 Neoplasm of unspecified behavior of right kidney: Secondary | ICD-10-CM

## 2021-07-21 ENCOUNTER — Other Ambulatory Visit: Payer: Self-pay | Admitting: Dermatology

## 2021-07-24 ENCOUNTER — Other Ambulatory Visit: Payer: Self-pay

## 2021-07-25 ENCOUNTER — Other Ambulatory Visit: Payer: Self-pay

## 2021-07-25 ENCOUNTER — Ambulatory Visit
Admission: RE | Admit: 2021-07-25 | Discharge: 2021-07-25 | Disposition: A | Discharge status: Home / Self Care | Payer: 59 | Source: Ambulatory Visit | Attending: Adult Health | Admitting: Adult Health

## 2021-07-25 DIAGNOSIS — D49511 Neoplasm of unspecified behavior of right kidney: Secondary | ICD-10-CM

## 2021-07-25 MED ORDER — GADOBENATE DIMEGLUMINE 529 MG/ML IV SOLN
11.0000 mL | Freq: Once | INTRAVENOUS | Status: AC | PRN
  Administered 2021-07-25: 11 mL via INTRAVENOUS

## 2021-07-27 ENCOUNTER — Other Ambulatory Visit: Payer: Self-pay

## 2021-07-27 ENCOUNTER — Ambulatory Visit (INDEPENDENT_AMBULATORY_CARE_PROVIDER_SITE_OTHER): Payer: 59 | Admitting: Dermatology

## 2021-07-27 ENCOUNTER — Encounter: Payer: Self-pay | Admitting: Dermatology

## 2021-07-27 DIAGNOSIS — D0472 Carcinoma in situ of skin of left lower limb, including hip: Secondary | ICD-10-CM

## 2021-07-27 DIAGNOSIS — L309 Dermatitis, unspecified: Secondary | ICD-10-CM

## 2021-07-27 DIAGNOSIS — D485 Neoplasm of uncertain behavior of skin: Secondary | ICD-10-CM

## 2021-07-27 NOTE — Patient Instructions (Signed)

## 2021-08-01 MED ORDER — IMIQUIMOD 5 % EX CREA: TOPICAL_CREAM | CUTANEOUS | 0 refills | Status: DC

## 2021-08-01 NOTE — Telephone Encounter (Signed)
-----   Message from Lavonna Monarch, MD sent at 08/01/2021  6:15 AM EDT ----- Please let patient know that although each biopsy showed a superficial nonmelanoma skin cancer, the margins are already narrowly clear and I would like to try and avoid more surgery on her lower leg.  Wait 2 weeks to allow the biopsies to heal and then begin using topical Aldara on each biopsy site on Monday Wednesday and Friday for 6 weeks or until she sees brisk inflammation.  I'd like to check the areas 2 months after this is completed.

## 2021-08-03 ENCOUNTER — Telehealth: Payer: Self-pay | Admitting: Dermatology

## 2021-08-03 MED ORDER — IMIQUIMOD 5 % EX CREA: TOPICAL_CREAM | CUTANEOUS | 0 refills | Status: DC

## 2021-08-03 NOTE — Telephone Encounter (Signed)
Phone call to patient with her pathology results and Dr. Onalee Hua recommendations. Patient aware.

## 2021-08-03 NOTE — Telephone Encounter (Signed)
Patient is calling for pathology results from last visit with Stuart Tafeen, MD 

## 2021-08-13 ENCOUNTER — Encounter: Payer: Self-pay | Admitting: Dermatology

## 2021-08-13 NOTE — Progress Notes (Signed)
Follow-Up Visit   Subjective  Theresa Holloway is a 70 y.o. female who presents for the following: Rash (Rash x 1 year on lower legs- + itch tx- otc cream).  Spots on legs Location:  Duration:  Quality:  Associated Signs/Symptoms: Modifying Factors:  Severity:  Timing: Context:   Objective  Well appearing patient in no apparent distress; mood and affect are within normal limits. Left Lower Leg - Posterior 8 mm waxy pink crust, superficial carcinoma versus DCIS  Left Lower Leg - Anterior 7 mm waxy crust  Left Lower Leg - Anterior, Right Lower Leg - Anterior Although patient has had typical small plaque psoriasis in the today there is minimal inflammation (on Otezla) and several lesions would fit DC or superficial carcinomas.    A focused examination was performed including legs. Relevant physical exam findings are noted in the Assessment and Plan.   Assessment & Plan    Carcinoma in situ of skin of left lower extremity including hip (2) Left Lower Leg - Posterior  Skin / nail biopsy Type of biopsy: tangential   Informed consent: discussed and consent obtained   Timeout: patient name, date of birth, surgical site, and procedure verified   Procedure prep:  Patient was prepped and draped in usual sterile fashion (Non sterile) Prep type:  Chlorhexidine Anesthesia: the lesion was anesthetized in a standard fashion   Anesthetic:  1% lidocaine w/ epinephrine 1-100,000 local infiltration Instrument used: flexible razor blade   Outcome: patient tolerated procedure well   Post-procedure details: wound care instructions given    Destruction of lesion Complexity: simple   Destruction method: electrodesiccation and curettage   Informed consent: discussed and consent obtained   Timeout:  patient name, date of birth, surgical site, and procedure verified Anesthesia: the lesion was anesthetized in a standard fashion   Anesthetic:  1% lidocaine w/ epinephrine 1-100,000 local  infiltration Curettage cycles:  3 Lesion length (cm):  0.8 Lesion width (cm):  0.8 Margin per side (cm):  0 Final wound size (cm):  0.8 Hemostasis achieved with:  ferric subsulfate Outcome: patient tolerated procedure well with no complications   Post-procedure details: sterile dressing applied and wound care instructions given   Dressing type: bandage and petrolatum    Specimen 2 - Surgical pathology Differential Diagnosis: dsap vs dermatitis  Check Margins: No  Left Lower Leg - Anterior  Skin / nail biopsy Type of biopsy: tangential   Informed consent: discussed and consent obtained   Timeout: patient name, date of birth, surgical site, and procedure verified   Procedure prep:  Patient was prepped and draped in usual sterile fashion (Non sterile) Prep type:  Chlorhexidine Anesthesia: the lesion was anesthetized in a standard fashion   Anesthetic:  1% lidocaine w/ epinephrine 1-100,000 local infiltration Instrument used: flexible razor blade   Outcome: patient tolerated procedure well   Post-procedure details: wound care instructions given    Destruction of lesion Complexity: simple   Destruction method: electrodesiccation and curettage   Informed consent: discussed and consent obtained   Timeout:  patient name, date of birth, surgical site, and procedure verified Anesthesia: the lesion was anesthetized in a standard fashion   Anesthetic:  1% lidocaine w/ epinephrine 1-100,000 local infiltration Curettage cycles:  3 Lesion length (cm):  0.7 Lesion width (cm):  0.7 Margin per side (cm):  0 Final wound size (cm):  0.7 Hemostasis achieved with:  ferric subsulfate Outcome: patient tolerated procedure well with no complications   Post-procedure details: sterile dressing  applied and wound care instructions given   Dressing type: bandage and petrolatum    Specimen 1 - Surgical pathology Differential Diagnosis: dsap vs dermatitis  Check Margins: No  After shave biopsy of  the base of each lesion was treated with curettage plus cautery.  Dermatitis Left Lower Leg - Anterior; Right Lower Leg - Anterior  We will initially obtain biopsies before suggesting any alteration in therapy for the itching.      I, Lavonna Monarch, MD, have reviewed all documentation for this visit.  The documentation on 08/13/21 for the exam, diagnosis, procedures, and orders are all accurate and complete.

## 2021-09-14 ENCOUNTER — Ambulatory Visit
Admission: RE | Admit: 2021-09-14 | Discharge: 2021-09-14 | Disposition: A | Discharge status: Home / Self Care | Payer: 59 | Source: Ambulatory Visit | Attending: Family Medicine | Admitting: Family Medicine

## 2021-09-14 DIAGNOSIS — Z1231 Encounter for screening mammogram for malignant neoplasm of breast: Secondary | ICD-10-CM

## 2021-11-20 ENCOUNTER — Other Ambulatory Visit: Payer: Self-pay | Admitting: Dermatology

## 2021-12-26 ENCOUNTER — Other Ambulatory Visit: Payer: Self-pay | Admitting: Dermatology

## 2021-12-26 ENCOUNTER — Other Ambulatory Visit: Payer: Self-pay

## 2021-12-26 ENCOUNTER — Encounter: Payer: Self-pay | Admitting: Dermatology

## 2021-12-26 ENCOUNTER — Ambulatory Visit: Payer: 59 | Admitting: Dermatology

## 2021-12-26 DIAGNOSIS — L565 Disseminated superficial actinic porokeratosis (DSAP): Secondary | ICD-10-CM

## 2021-12-26 DIAGNOSIS — Z85828 Personal history of other malignant neoplasm of skin: Secondary | ICD-10-CM

## 2021-12-26 MED ORDER — DOXEPIN HCL 5 % EX CREA
1.0000 | TOPICAL_CREAM | Freq: Every day | CUTANEOUS | 3 refills | Status: DC
Start: 2021-12-26 — End: 2021-12-27

## 2021-12-27 ENCOUNTER — Other Ambulatory Visit: Payer: Self-pay | Admitting: Dermatology

## 2021-12-27 ENCOUNTER — Other Ambulatory Visit: Payer: Self-pay | Admitting: Urology

## 2021-12-27 DIAGNOSIS — L565 Disseminated superficial actinic porokeratosis (DSAP): Secondary | ICD-10-CM

## 2021-12-27 DIAGNOSIS — D49512 Neoplasm of unspecified behavior of left kidney: Secondary | ICD-10-CM

## 2022-01-02 ENCOUNTER — Other Ambulatory Visit: Payer: Self-pay | Admitting: Urology

## 2022-01-02 DIAGNOSIS — D49512 Neoplasm of unspecified behavior of left kidney: Secondary | ICD-10-CM

## 2022-01-02 NOTE — Telephone Encounter (Signed)
Phone call to patient with Dr. Onalee Hua recommendations. Patient states that she would like the 60g jar of the Doxepin. Per Dr. Denna Haggard call in Doxepin 4%.  ?

## 2022-01-02 NOTE — Telephone Encounter (Signed)
Phone call to [facility] to have them compound Doxepin 4% cream. Per Pharmacist they will get this compounded and give the patient a call.  ?

## 2022-01-05 ENCOUNTER — Other Ambulatory Visit: Payer: Self-pay | Admitting: Dermatology

## 2022-01-05 DIAGNOSIS — L565 Disseminated superficial actinic porokeratosis (DSAP): Secondary | ICD-10-CM

## 2022-01-06 ENCOUNTER — Encounter: Payer: Self-pay | Admitting: Dermatology

## 2022-01-06 NOTE — Progress Notes (Signed)
? ?  Follow-Up Visit ?  ?Subjective  ?Theresa Holloway is a 71 y.o. female who presents for the following: Follow-up (F/u for Carcinoma in situ of skin of left lower leg. ). ? ?History of skin cancer leg, multiple other very itchy crusts on legs ?Location:  ?Duration:  ?Quality:  ?Associated Signs/Symptoms: ?Modifying Factors:  ?Severity:  ?Timing: ?Context:  ? ?Objective  ?Well appearing patient in no apparent distress; mood and affect are within normal limits. ?No recurrent skin cancer. ? ?Left Lower Leg - Anterior, Right Lower Leg - Anterior ?Although there is no definite skin cancer, she continues to have multiple small keratoses several of which have a sharply defined margins suggestive of disease.  There is considerable pruritus which has negative impact on her quality of life. ? ? ? ?A focused examination was performed including legs and arms. Relevant physical exam findings are noted in the Assessment and Plan. ? ? ?Assessment & Plan  ? ? ?DSAP (disseminated superficial actinic porokeratosis) (2) ?Left Lower Leg - Anterior; Right Lower Leg - Anterior ? ?We will try to get her topical doxepin.  If cream does not work, bring pt back in for red light PDT with occlusion. ? ?Doxepin HCl (ZONALON) 5 % CREA - Left Lower Leg - Anterior, Right Lower Leg - Anterior ?Apply 1 application. topically daily. ? ?Personal history of skin cancer ? ?Check as needed change ? ? ? ? ? ?I, Lavonna Monarch, MD, have reviewed all documentation for this visit.  The documentation on 01/06/22 for the exam, diagnosis, procedures, and orders are all accurate and complete. ?

## 2022-01-22 ENCOUNTER — Ambulatory Visit
Admission: RE | Admit: 2022-01-22 | Discharge: 2022-01-22 | Disposition: A | Discharge status: Home / Self Care | Payer: 59 | Source: Ambulatory Visit | Attending: Urology | Admitting: Urology

## 2022-01-22 DIAGNOSIS — D49512 Neoplasm of unspecified behavior of left kidney: Secondary | ICD-10-CM

## 2022-01-22 MED ORDER — GADOBENATE DIMEGLUMINE 529 MG/ML IV SOLN
11.0000 mL | Freq: Once | INTRAVENOUS | Status: AC | PRN
  Administered 2022-01-22: 11 mL via INTRAVENOUS

## 2022-01-29 ENCOUNTER — Ambulatory Visit: Payer: 59 | Admitting: Dermatology

## 2022-02-12 ENCOUNTER — Other Ambulatory Visit: Payer: Self-pay | Admitting: Urology

## 2022-02-28 ENCOUNTER — Other Ambulatory Visit: Payer: Self-pay | Admitting: Dermatology

## 2022-04-10 ENCOUNTER — Encounter: Payer: Self-pay | Admitting: Adult Health

## 2022-04-10 ENCOUNTER — Ambulatory Visit (INDEPENDENT_AMBULATORY_CARE_PROVIDER_SITE_OTHER): Payer: 59 | Admitting: Adult Health

## 2022-04-10 VITALS — BP 132/62 | HR 88 | Ht 65.0 in | Wt 133.0 lb

## 2022-04-10 DIAGNOSIS — Z01419 Encounter for gynecological examination (general) (routine) without abnormal findings: Secondary | ICD-10-CM | POA: Diagnosis not present

## 2022-04-10 DIAGNOSIS — Z79899 Other long term (current) drug therapy: Secondary | ICD-10-CM | POA: Diagnosis not present

## 2022-04-10 DIAGNOSIS — E041 Nontoxic single thyroid nodule: Secondary | ICD-10-CM | POA: Diagnosis not present

## 2022-04-10 DIAGNOSIS — N816 Rectocele: Secondary | ICD-10-CM | POA: Diagnosis not present

## 2022-04-10 DIAGNOSIS — Z1211 Encounter for screening for malignant neoplasm of colon: Secondary | ICD-10-CM

## 2022-04-10 DIAGNOSIS — T63441A Toxic effect of venom of bees, accidental (unintentional), initial encounter: Secondary | ICD-10-CM

## 2022-04-10 DIAGNOSIS — Z90710 Acquired absence of both cervix and uterus: Secondary | ICD-10-CM

## 2022-04-10 LAB — HEMOCCULT GUIAC POC 1CARD (OFFICE): Fecal Occult Blood, POC: NEGATIVE

## 2022-04-10 MED ORDER — ESTRADIOL 1 MG PO TABS
1.0000 mg | ORAL_TABLET | Freq: Every day | ORAL | 4 refills | Status: DC
Start: 2022-04-10 — End: 2023-04-15

## 2022-05-04 NOTE — Patient Instructions (Signed)
DUE TO COVID-19 ONLY TWO VISITORS  (aged 69 and older)  ARE ALLOWED TO COME WITH YOU AND STAY IN THE WAITING ROOM ONLY DURING PRE OP AND PROCEDURE.   **NO VISITORS ARE ALLOWED IN THE SHORT STAY AREA OR RECOVERY ROOM!!**  IF YOU WILL BE ADMITTED INTO THE HOSPITAL YOU ARE ALLOWED ONLY FOUR SUPPORT PEOPLE DURING VISITATION HOURS ONLY (7 AM -8PM)   The support person(s) must pass our screening, gel in and out, and wear a mask at all times, including in the patient's room. Patients must also wear a mask when staff or their support person are in the room. Visitors GUEST BADGE MUST BE WORN VISIBLY  One adult visitor may remain with you overnight and MUST be in the room by 8 P.M.     Your procedure is scheduled on: 05/17/22   Report to [facility] Main Entrance    Report to admitting at  9:30 AM   Call this number if you have problems the morning of surgery [phone]   Do not eat food :After Midnight.on 05/16/22   you may have the following liquids until _Midnight___THE DAY OF SURGERY  Water Black Coffee (sugar ok, NO MILK/CREAM OR CREAMERS)  Tea (sugar ok, NO MILK/CREAM OR CREAMERS) regular and decaf                             Plain Jell-O (NO RED)                                           Fruit ices (not with fruit pulp, NO RED)                                     Popsicles (NO RED)                                                                  Juice: apple, WHITE grape, WHITE cranberry Sports drinks like Gatorade (NO RED)                           If you have questions, please contact your surgeon's office.   FOLLOW BOWEL PREP AND ANY ADDITIONAL PRE OP INSTRUCTIONS YOU RECEIVED FROM YOUR SURGEON'S OFFICE!!!  Drink plenty of fluids on prep day   Oral Hygiene is also important to reduce your risk of infection.                                    Remember - BRUSH YOUR TEETH THE MORNING OF SURGERY WITH YOUR REGULAR TOOTHPASTE     Take these medicines the morning of  surgery with A SIP OF WATER: Lithyronine, Pantoprazole, Flonase, Zyrtec   Bring CPAP mask and tubing day of surgery.                              You may  not have any metal on your body including hair pins, jewelry, and body piercing             Do not wear make-up, lotions, powders, perfumes/cologne, or deodorant  Do not wear nail polish including gel and S&S, artificial/acrylic nails, or any other type of covering on natural nails including finger and toenails. If you have artificial nails, gel coating, etc. that needs to be removed by a nail salon please have this removed prior to surgery or surgery may need to be canceled/ delayed if the surgeon/ anesthesia feels like they are unable to be safely monitored.   Do not shave  48 hours prior to surgery.     Do not bring valuables to the hospital. [place].   Contacts, dentures or bridgework may not be worn into surgery.   Bring small overnight bag day of surgery.   DO NOT [place]. PHARMACY WILL DISPENSE MEDICATIONS LISTED ON YOUR MEDICATION LIST TO YOU DURING YOUR ADMISSION [place]!     Special Instructions: Bring a copy of your healthcare power of attorney and living will documents  the day of surgery if you haven't scanned them before.              Please read over the following fact sheets you were given: IF YOU HAVE QUESTIONS ABOUT YOUR PRE-OP INSTRUCTIONS PLEASE CALL [phone]     [facility] - Preparing for Surgery Before surgery, you can play an important role.  Because skin is not sterile, your skin needs to be as free of germs as possible.  You can reduce the number of germs on your skin by washing with CHG (chlorahexidine gluconate) soap before surgery.  CHG is an antiseptic cleaner which kills germs and bonds with the skin to continue killing germs even after washing. Please DO NOT use if you have an allergy to CHG or  antibacterial soaps.  If your skin becomes reddened/irritated stop using the CHG and inform your nurse when you arrive at Short Stay. Do not shave (including legs and underarms) for at least 48 hours prior to the first CHG shower.  Please follow these instructions carefully:  1.  Shower with CHG Soap the night before surgery and the  morning of Surgery.  2.  If you choose to wash your hair, wash your hair first as usual with your  normal  shampoo.  3.  After you shampoo, rinse your hair and body thoroughly to remove the  shampoo.                            4.  Use CHG as you would any other liquid soap.  You can apply chg directly  to the skin and wash                       Gently with a scrungie or clean washcloth.  5.  Apply the CHG Soap to your body ONLY FROM THE NECK DOWN.   Do not use on face/ open                           Wound or open sores. Avoid contact with eyes, ears mouth and genitals (private parts).  Wash face,  Genitals (private parts) with your normal soap.             6.  Wash thoroughly, paying special attention to the area where your surgery  will be performed.  7.  Thoroughly rinse your body with warm water from the neck down.  8.  DO NOT shower/wash with your normal soap after using and rinsing off  the CHG Soap.             9.  Pat yourself dry with a clean towel.            10.  Wear clean pajamas.            11.  Place clean sheets on your bed the night of your first shower and do not  sleep with pets. Day of Surgery : Do not apply any lotions/deodorants the morning of surgery.  Please wear clean clothes to the hospital/surgery center.  FAILURE TO FOLLOW THESE INSTRUCTIONS MAY RESULT IN THE CANCELLATION OF YOUR SURGERY    ________________________________________________________________________   Incentive Spirometer  An incentive spirometer is a tool that can help keep your lungs clear and active. This tool measures how well you are filling  your lungs with each breath. Taking long deep breaths may help reverse or decrease the chance of developing breathing (pulmonary) problems (especially infection) following: A long period of time when you are unable to move or be active. BEFORE THE PROCEDURE  If the spirometer includes an indicator to show your best effort, your nurse or respiratory therapist will set it to a desired goal. If possible, sit up straight or lean slightly forward. Try not to slouch. Hold the incentive spirometer in an upright position. INSTRUCTIONS FOR USE  Sit on the edge of your bed if possible, or sit up as far as you can in bed or on a chair. Hold the incentive spirometer in an upright position. Breathe out normally. Place the mouthpiece in your mouth and seal your lips tightly around it. Breathe in slowly and as deeply as possible, raising the piston or the ball toward the top of the column. Hold your breath for 3-5 seconds or for as long as possible. Allow the piston or ball to fall to the bottom of the column. Remove the mouthpiece from your mouth and breathe out normally. Rest for a few seconds and repeat Steps 1 through 7 at least 10 times every 1-2 hours when you are awake. Take your time and take a few normal breaths between deep breaths. The spirometer may include an indicator to show your best effort. Use the indicator as a goal to work toward during each repetition. After each set of 10 deep breaths, practice coughing to be sure your lungs are clear. If you have an incision (the cut made at the time of surgery), support your incision when coughing by placing a pillow or rolled up towels firmly against it. Once you are able to get out of bed, walk around indoors and cough well. You may stop using the incentive spirometer when instructed by your caregiver.  RISKS AND COMPLICATIONS Take your time so you do not get dizzy or light-headed. If you are in pain, you may need to take or ask for pain medication  before doing incentive spirometry. It is harder to take a deep breath if you are having pain. AFTER USE Rest and breathe slowly and easily. It can be helpful to keep track of a log of your progress.  Your caregiver can provide you with a simple table to help with this. If you are using the spirometer at home, follow these instructions: [place] IF:  You are having difficultly using the spirometer. You have trouble using the spirometer as often as instructed. Your pain medication is not giving enough relief while using the spirometer. You develop fever of 100.5 F (38.1 C) or higher. SEEK IMMEDIATE MEDICAL CARE IF:  You cough up bloody sputum that had not been present before. You develop fever of 102 F (38.9 C) or greater. You develop worsening pain at or near the incision site. MAKE SURE YOU:  Understand these instructions. Will watch your condition. Will get help right away if you are not doing well or get worse. Document Released: 02/11/2007 Document Revised: 12/24/2011 Document Reviewed: 04/14/2007 [facility] Patient Information 2014 [place], [place].   ________________________________________________________________________

## 2022-05-09 ENCOUNTER — Encounter (HOSPITAL_COMMUNITY)
Admission: RE | Admit: 2022-05-09 | Discharge: 2022-05-09 | Disposition: A | Discharge status: Home / Self Care | Payer: 59 | Source: Ambulatory Visit | Attending: Urology | Admitting: Urology

## 2022-05-09 ENCOUNTER — Encounter (HOSPITAL_COMMUNITY): Payer: Self-pay

## 2022-05-09 ENCOUNTER — Other Ambulatory Visit: Payer: Self-pay

## 2022-05-09 DIAGNOSIS — Z01818 Encounter for other preprocedural examination: Secondary | ICD-10-CM | POA: Insufficient documentation

## 2022-05-09 HISTORY — DX: Psoriasis, unspecified: L40.9

## 2022-05-09 LAB — BASIC METABOLIC PANEL WITH GFR
Anion gap: 10 (ref 5–15)
BUN: 13 mg/dL (ref 8–23)
CO2: 29 mmol/L (ref 22–32)
Calcium: 9.5 mg/dL (ref 8.9–10.3)
Chloride: 101 mmol/L (ref 98–111)
Creatinine, Ser: 0.71 mg/dL (ref 0.44–1.00)
GFR, Estimated: >60 mL/min
Glucose, Bld: 97 mg/dL (ref 70–99)
Potassium: 3.6 mmol/L (ref 3.5–5.1)
Sodium: 140 mmol/L (ref 135–145)

## 2022-05-09 LAB — CBC
HCT: 39.5 % (ref 36.0–46.0)
Hemoglobin: 12.9 g/dL (ref 12.0–15.0)
MCH: 30 pg (ref 26.0–34.0)
MCHC: 32.7 g/dL (ref 30.0–36.0)
MCV: 91.9 fL (ref 80.0–100.0)
Platelets: 372 K/uL (ref 150–400)
RBC: 4.3 MIL/uL (ref 3.87–5.11)
RDW: 13.2 % (ref 11.5–15.5)
WBC: 7.9 K/uL (ref 4.0–10.5)
nRBC: 0 % (ref 0.0–0.2)

## 2022-05-09 NOTE — Progress Notes (Signed)
Anesthesia note:  Bowel prep reminder:yes. Pt will call office because she doesn't think miralax will work  PCP - Dr. Eddie Candle seen 04/05/22 Cardiologist -none Other-   Chest x-ray - no EKG - 05/09/22-chart Stress Test - no ECHO - no Cardiac Cath - no  Pacemaker/ICD device last checked:NA  Sleep Study - no CPAP -   Pt is pre diabetic-NA Fasting Blood Sugar -  Checks Blood Sugar _____  Blood Thinner:NA Blood Thinner Instructions: Aspirin Instructions: Last Dose:  Anesthesia review: yes  Patient denies shortness of breath, fever, cough and chest pain at PAT appointment Pt has had bronchitis for more than a month. She finished antibiotics at the end of June but is still coughing. Her Drs know and I told her to call if it gets worse.  Patient verbalized understanding of instructions that were given to them at the PAT appointment. Patient was also instructed that they will need to review over the PAT instructions again at home before surgery. yes

## 2022-05-16 NOTE — H&P (Signed)
Office Visit Report 05/01/2022    Theresa Holloway         MRN: [id number]  PRIMARY CARE:  Theresa Sites, MD    REFERRING:  Theresa Holloway, Theresa Holloway    PROVIDER:  Raynelle Holloway, M.D.  DOB: [birth year], 71 year old Female  TREATING:  Theresa Holloway, [place]  SSN: -[id number]  LOCATION:  [facility] [phone]    CC/HPI: Pt presents today for pre-operative history and physical exam in anticipation of left robotic assisted lap partial nephrectomy by Dr. Alinda Holloway on 05/17/22. She is doing well and is without complaint.   Pt denies F/C, HA, CP, SOB, N/V, diarrhea/constipation, back pain, flank pain, hematuria, and dysuria.     HX:   Left renal neoplasm   Theresa Holloway is a pleasant 71 year old female who has a history of papillary renal cell carcinoma. I had performed a right robotic partial nephrectomy on her in August 2009. Last summer, she presented and developed sepsis related to bilateral ureteral obstruction due to vaginal prolapse. She underwent ureteral stent placement and subsequently was treated with a sacrocolpopexy by Dr. Louis Holloway. She has done well with this. On her prior imaging, she was noted to have a questionable left renal mass and underwent a dedicated MRI of the abdomen with and without contrast in October which demonstrated to lesions of concern including a 14 mm medial upper pole lesion measuring 14 mm and a second upper pole lesion measuring 7 mm that was more indeterminate. She returns today with repeat abdominal imaging with an MRI and to see me to consider potential treatment if necessary. She denies any hematuria, unintentional weight loss, or other complaints. She has remained in good stable overall health.     ALLERGIES: Codeine Derivatives - Trouble Breathing Morphine Derivatives - Swelling, "throat" Penicillins - Trouble Breathing Sulfa Drugs - Trouble Breathing   MEDICATIONS: Estradiol 1 mg tablet 1 tablet PO Daily  Furosemide 40 mg tablet Oral  Levothyroxine  Sodium 50 mcg capsule 1 capsule PO Daily  Otezla 30 mg tablet 1 tablet PO BID  Protonix 40 mg tablet, delayed release 1 tablet PO Daily  Xanax 1 mg tablet Oral    GU PSH: Complex cystometrogram, w/ void pressure and urethral pressure profile studies, any technique - 01/19/2021 Complex Uroflow - 01/19/2021 Cysto Remove Stent FB Sim - 06/07/2021 Cystoscopy Insert Stent - 05/24/2021 Emg surf Electrd - 01/19/2021 Hysterectomy Unilat SO - 2009 Inject For cystogram - 01/19/2021 Intrabd voidng Press - 01/19/2021 Laparoscopy; Surg; Colpopexy - 05/24/2021 Partial nephrectomy (laparoscopic) - 2009      PSH Notes: Kidney Surgery Laparoscopic Partial Nephrectomy, Cholecystectomy Laparoscopic, Hysterectomy, Hand Surgery, Knee Surgery, Elbow Surgery, Back Surgery, Foot Repair     NON-GU PSH: Cholecystectomy (laparoscopic) - 2009         GU PMH: History of kidney cancer - 03/09/2022, - 02/07/2022 Left renal neoplasm - 03/09/2022, - 02/07/2022, - 08/29/2021 Cystocele, Unspec - 08/29/2021, - 07/19/2021, - 06/16/2021, - 06/07/2021, - 02/27/2021 Hydronephrosis - 07/19/2021, - 06/16/2021, - 06/07/2021 Neoplasm of unspecified behavior of unspecified kidney - 07/19/2021, Renal neoplasm, - 2014 Renal cyst - 07/19/2021 Mixed incontinence - 02/27/2021, - 02/02/2021, - 12/20/2020 Urinary Frequency - 02/27/2021, - 02/02/2021, - 01/19/2021, - 12/20/2020 Nocturia - 12/20/2020 Kidney Cancer Unspec, except renal pelvis, Renal cell carcinoma, unspecified laterality - 2014      PMH Notes:   1) Renal cell carcinoma: She is s/p a right RAL partial nephrectomy on 06/14/2008 for a stage I papillary renal  cell carcinoma.   Diagnosis: pT1a Nx Mx, Grade 2 papillary renal cell carcinoma with negative surgical margins  Baseline renal function: Normal   2) Left renal neoplasms: She was incidentally noted to have two small left renal masses on an MRI in 2022 measuring 14 mm and 7 mm, respectively.   3) Vaginal prolapse: She is s/p a RAL hysterectomy and  sacrocolpopexy by Dr. Louis Holloway in 2022 after presenting with bilateral ureteral obstruction due to her prolapse.   NON-GU PMH: Fever, unspecified - 05/15/2021 Encounter for general adult medical examination without abnormal findings, Encounter for preventive health examination - 2015 Anxiety, Anxiety - 2014 Asthma, Asthma - 2014 Gastric ulcer, unspecified as acute or chronic, without hemorrhage or perforation, Gastric Ulcer - 2014 Personal history of other diseases of the digestive system, History of esophageal reflux - 2014 Personal history of other mental and behavioral disorders, History of depression - 2014 GERD    FAMILY HISTORY: Kidney Cancer - No Family History No pertinent family history - Other   SOCIAL HISTORY: Marital Status: Single Current Smoking Status: Patient does not smoke anymore. Has not smoked since 12/13/2005. Smoked for 10 years. Smoked 1/2 pack per day.  <DIV'  Tobacco Use Assessment Completed:  Used Tobacco in last 30 days?   Does not use smokeless tobacco. Has never drank.  Does not use drugs. Does not drink caffeine. Has not had a blood transfusion.     Notes: Previous History Of Smoking, Marital History - Single, Occupation:, Alcohol Use   REVIEW OF SYSTEMS:     GU Review Female:  Patient denies frequent urination, hard to postpone urination, burning /pain with urination, get up at night to urinate, leakage of urine, stream starts and stops, trouble starting your stream, have to strain to urinate, and being pregnant.    Gastrointestinal (Upper):  Patient denies nausea, vomiting, and indigestion/ heartburn.    Gastrointestinal (Lower):  Patient denies diarrhea and constipation.    Constitutional:  Patient denies fever, night sweats, weight loss, and fatigue.    Skin:  Patient denies skin rash/ lesion and itching.    Eyes:  Patient denies blurred vision and double vision.    Ears/ Nose/ Throat:  Patient denies sore throat and sinus problems.     Hematologic/Lymphatic:  Patient denies swollen glands and easy bruising.    Cardiovascular:  Patient denies leg swelling and chest pains.    Respiratory:  Patient reports cough. Patient denies shortness of breath.    Endocrine:  Patient denies excessive thirst.    Musculoskeletal:  Patient denies back pain and joint pain.    Neurological:  Patient denies headaches and dizziness.    Psychologic:  Patient denies depression and anxiety.    Notes: cough due to bronchitis diagnosed on 04/20/22. Resolving     VITAL SIGNS:       05/01/2022 01:52 PM     BP 154/71 mmHg     Pulse 92 /min     Temperature 97.1 F / 36.1 C     MULTI-SYSTEM PHYSICAL EXAMINATION:      Constitutional: Well-nourished. No physical deformities. Normally developed. Good grooming.     Neck: Neck symmetrical, not swollen. Normal tracheal position.     Respiratory: No labored breathing, no use of accessory muscles. Faint crackles bilateral     Cardiovascular: Regular rate and rhythm. No murmur, no gallop.      Lymphatic: No enlargement of neck, axillae, groin.     Skin: No paleness, no jaundice, no cyanosis. No  lesion, no ulcer, no rash.     Neurologic / Psychiatric: Oriented to time, oriented to place, oriented to person. No depression, no anxiety, no agitation.     Gastrointestinal: No mass, no tenderness, no rigidity, non obese abdomen.     Eyes: Normal conjunctivae. Normal eyelids.     Ears, Nose, Mouth, and Throat: Left ear no scars, no lesions, no masses. Right ear no scars, no lesions, no masses. Nose no scars, no lesions, no masses. Normal hearing. Normal lips.     Musculoskeletal: Normal gait and station of head and neck.            Complexity of Data:   Records Review:  Previous Patient Records  Urine Test Review:  Urinalysis    05/01/22  Urinalysis  Urine Appearance Clear   Urine Color Yellow   Urine Glucose Neg mg/dL  Urine Bilirubin Neg mg/dL  Urine Ketones Neg mg/dL  Urine Specific Gravity 1.025    Urine Blood Neg ery/uL  Urine pH 6.0   Urine Protein Neg mg/dL  Urine Urobilinogen 0.2 mg/dL  Urine Nitrites Neg   Urine Leukocyte Esterase 1+ leu/uL  Urine WBC/hpf 0 - 5/hpf   Urine RBC/hpf NS (Not Seen)   Urine Epithelial Cells 0 - 5/hpf   Urine Bacteria Rare (0-9/hpf)   Urine Mucous Not Present   Urine Yeast NS (Not Seen)   Urine Trichomonas Not Present   Urine Cystals NS (Not Seen)   Urine Casts NS (Not Seen)   Urine Sperm Not Present    PROCEDURES:    Urinalysis w/Scope - 81001  Dipstick Dipstick Cont'd Micro  Color: Yellow Bilirubin: Neg mg/dL WBC/hpf: 0 - 5/hpf  Appearance: Clear Ketones: Neg mg/dL RBC/hpf: NS (Not Seen)  Specific Gravity: 1.025 Blood: Neg ery/uL Bacteria: Rare (0-9/hpf)  pH: 6.0 Protein: Neg mg/dL Cystals: NS (Not Seen)  Glucose: Neg mg/dL Urobilinogen: 0.2 mg/dL Casts: NS (Not Seen)   Nitrites: Neg Trichomonas: Not Present   Leukocyte Esterase: 1+ leu/uL Mucous: Not Present    Epithelial Cells: 0 - 5/hpf    Yeast: NS (Not Seen)    Sperm: Not Present   Notes: QNS for spun micro     ASSESSMENT:     ICD-10 Details  1 GU:  Left renal neoplasm - D49.512    PLAN:   Orders  Labs Urine Culture  Schedule  Return Visit/Planned Activity: Keep Scheduled Appointment - Schedule Surgery  Document  Letter(s):  Created for Patient: Clinical Summary   Notes:  There are no changes in the patients history or physical exam since last evaluation by Dr. Alinda Holloway. Pt is scheduled to undergo left RAL partial nephrectomy on 05/17/22.   WBCs and bacteria on UA. She is asymptomatic. Check culture and treat if necessary.   All pt's questions were answered to the best of my ability.     Next Appointment:    Next Appointment: 05/17/2022 11:30 AM   Appointment Type: Surgery    Location: [facility] [phone]   Provider: Raynelle Holloway, M.D.   Reason for Visit: WL/EXT REC LT RA LAP PARTIAL NEPHRECTOMY WITH AMANDA   * Signed by Theresa Rossetti, PA on 05/01/22 at 2:28 PM (EDT)*

## 2022-05-17 ENCOUNTER — Ambulatory Visit (HOSPITAL_BASED_OUTPATIENT_CLINIC_OR_DEPARTMENT_OTHER): Payer: 59 | Admitting: Certified Registered Nurse Anesthetist

## 2022-05-17 ENCOUNTER — Ambulatory Visit (HOSPITAL_COMMUNITY): Payer: 59 | Admitting: Certified Registered Nurse Anesthetist

## 2022-05-17 ENCOUNTER — Other Ambulatory Visit: Payer: Self-pay

## 2022-05-17 ENCOUNTER — Encounter (HOSPITAL_COMMUNITY): Payer: Self-pay | Admitting: Urology

## 2022-05-17 ENCOUNTER — Encounter (HOSPITAL_COMMUNITY)
Admission: AD | Disposition: A | Discharge status: Home / Self Care | Payer: Self-pay | Source: Ambulatory Visit | Attending: Urology

## 2022-05-17 ENCOUNTER — Inpatient Hospital Stay (HOSPITAL_COMMUNITY)
Admission: AD | Admit: 2022-05-17 | Discharge: 2022-05-19 | DRG: 657 | Disposition: A | Discharge status: Home / Self Care | Payer: 59 | Source: Ambulatory Visit | Attending: Urology | Admitting: Urology

## 2022-05-17 DIAGNOSIS — E039 Hypothyroidism, unspecified: Secondary | ICD-10-CM | POA: Diagnosis present

## 2022-05-17 DIAGNOSIS — Z882 Allergy status to sulfonamides status: Secondary | ICD-10-CM

## 2022-05-17 DIAGNOSIS — J449 Chronic obstructive pulmonary disease, unspecified: Secondary | ICD-10-CM | POA: Diagnosis not present

## 2022-05-17 DIAGNOSIS — Z01818 Encounter for other preprocedural examination: Principal | ICD-10-CM

## 2022-05-17 DIAGNOSIS — D49512 Neoplasm of unspecified behavior of left kidney: Secondary | ICD-10-CM

## 2022-05-17 DIAGNOSIS — C642 Malignant neoplasm of left kidney, except renal pelvis: Principal | ICD-10-CM | POA: Diagnosis present

## 2022-05-17 DIAGNOSIS — L409 Psoriasis, unspecified: Secondary | ICD-10-CM | POA: Diagnosis present

## 2022-05-17 DIAGNOSIS — Z885 Allergy status to narcotic agent status: Secondary | ICD-10-CM

## 2022-05-17 DIAGNOSIS — Z85828 Personal history of other malignant neoplasm of skin: Secondary | ICD-10-CM

## 2022-05-17 DIAGNOSIS — Z85528 Personal history of other malignant neoplasm of kidney: Secondary | ICD-10-CM

## 2022-05-17 DIAGNOSIS — D62 Acute posthemorrhagic anemia: Secondary | ICD-10-CM | POA: Diagnosis not present

## 2022-05-17 DIAGNOSIS — Z79899 Other long term (current) drug therapy: Secondary | ICD-10-CM

## 2022-05-17 DIAGNOSIS — Z87891 Personal history of nicotine dependence: Secondary | ICD-10-CM | POA: Diagnosis not present

## 2022-05-17 DIAGNOSIS — K219 Gastro-esophageal reflux disease without esophagitis: Secondary | ICD-10-CM | POA: Diagnosis present

## 2022-05-17 DIAGNOSIS — Z880 Allergy status to penicillin: Secondary | ICD-10-CM

## 2022-05-17 HISTORY — PX: ROBOTIC ASSITED PARTIAL NEPHRECTOMY: SHX6087

## 2022-05-17 LAB — BASIC METABOLIC PANEL
Anion gap: 9 (ref 5–15)
BUN: 15 mg/dL (ref 8–23)
CO2: 25 mmol/L (ref 22–32)
Calcium: 9 mg/dL (ref 8.9–10.3)
Chloride: 107 mmol/L (ref 98–111)
Creatinine, Ser: 0.82 mg/dL (ref 0.44–1.00)
GFR, Estimated: >60 mL/min (ref >60)
Glucose, Bld: 98 mg/dL (ref 70–99)
Potassium: 3.6 mmol/L (ref 3.5–5.1)
Sodium: 141 mmol/L (ref 135–145)

## 2022-05-17 LAB — TYPE AND SCREEN
ABO/RH(D): A POS
Antibody Screen: NEGATIVE

## 2022-05-17 LAB — HEMOGLOBIN AND HEMATOCRIT, BLOOD
HCT: 39.2 % (ref 36.0–46.0)
Hemoglobin: 12.2 g/dL (ref 12.0–15.0)

## 2022-05-17 SURGERY — NEPHRECTOMY, PARTIAL, ROBOT-ASSISTED
Anesthesia: General | Laterality: Left

## 2022-05-17 MED ORDER — PROPOFOL 1000 MG/100ML IV EMUL
INTRAVENOUS | Status: AC
Start: 2022-05-17 — End: ?
  Filled 2022-05-17: qty 100

## 2022-05-17 MED ORDER — POLYETHYLENE GLYCOL 3350 17 G PO PACK
17.0000 g | PACK | Freq: Every day | ORAL | Status: DC
  Filled 2022-05-17: qty 1

## 2022-05-17 MED ORDER — DOCUSATE SODIUM 100 MG PO CAPS
100.0000 mg | ORAL_CAPSULE | Freq: Two times a day (BID) | ORAL | Status: DC
Start: 2022-05-17 — End: 2022-05-19
  Administered 2022-05-17 – 2022-05-18 (×3): 100 mg via ORAL
  Filled 2022-05-17 (×4): qty 1

## 2022-05-17 MED ORDER — DEXAMETHASONE SODIUM PHOSPHATE 10 MG/ML IJ SOLN
INTRAMUSCULAR | Status: DC | PRN
  Administered 2022-05-17: 5 mg via INTRAVENOUS

## 2022-05-17 MED ORDER — ONDANSETRON HCL 4 MG/2ML IJ SOLN
4.0000 mg | Freq: Once | INTRAMUSCULAR | Status: AC | PRN
  Administered 2022-05-17: 4 mg via INTRAVENOUS

## 2022-05-17 MED ORDER — LACTATED RINGERS IR SOLN
Status: DC | PRN
  Administered 2022-05-17: 1000 mL

## 2022-05-17 MED ORDER — DEXTROSE-NACL 5-0.45 % IV SOLN: INTRAVENOUS | Status: DC

## 2022-05-17 MED ORDER — ZOLPIDEM TARTRATE 5 MG PO TABS: 5.0000 mg | ORAL_TABLET | Freq: Every evening | ORAL | Status: DC | PRN

## 2022-05-17 MED ORDER — ONDANSETRON HCL 4 MG/2ML IJ SOLN
INTRAMUSCULAR | Status: AC
  Filled 2022-05-17: qty 2

## 2022-05-17 MED ORDER — PROPOFOL 10 MG/ML IV BOLUS
INTRAVENOUS | Status: AC
  Filled 2022-05-17: qty 20

## 2022-05-17 MED ORDER — LORATADINE 10 MG PO TABS
10.0000 mg | ORAL_TABLET | Freq: Every day | ORAL | Status: DC
  Administered 2022-05-18 – 2022-05-19 (×2): 10 mg via ORAL
  Filled 2022-05-17 (×2): qty 1

## 2022-05-17 MED ORDER — MIDAZOLAM HCL 2 MG/2ML IJ SOLN
INTRAMUSCULAR | Status: DC | PRN
  Administered 2022-05-17: 2 mg via INTRAVENOUS

## 2022-05-17 MED ORDER — DIPHENHYDRAMINE HCL 12.5 MG/5ML PO ELIX: 12.5000 mg | ORAL_SOLUTION | Freq: Four times a day (QID) | ORAL | Status: DC | PRN

## 2022-05-17 MED ORDER — BUPIVACAINE LIPOSOME 1.3 % IJ SUSP
INTRAMUSCULAR | Status: DC | PRN
  Administered 2022-05-17: 20 mL

## 2022-05-17 MED ORDER — STERILE WATER FOR IRRIGATION IR SOLN
Status: DC | PRN
  Administered 2022-05-17: 1000 mL

## 2022-05-17 MED ORDER — LACTATED RINGERS IV SOLN: INTRAVENOUS | Status: DC

## 2022-05-17 MED ORDER — PROPOFOL 10 MG/ML IV BOLUS
INTRAVENOUS | Status: DC | PRN
  Administered 2022-05-17: 100 mg via INTRAVENOUS
  Administered 2022-05-17 (×2): 20 mg via INTRAVENOUS

## 2022-05-17 MED ORDER — MONTELUKAST SODIUM 10 MG PO TABS
10.0000 mg | ORAL_TABLET | Freq: Every day | ORAL | Status: DC
  Administered 2022-05-17 – 2022-05-18 (×2): 10 mg via ORAL
  Filled 2022-05-17 (×2): qty 1

## 2022-05-17 MED ORDER — ORAL CARE MOUTH RINSE: 15.0000 mL | Freq: Once | OROMUCOSAL | Status: AC

## 2022-05-17 MED ORDER — DEXAMETHASONE SODIUM PHOSPHATE 10 MG/ML IJ SOLN
INTRAMUSCULAR | Status: AC
  Filled 2022-05-17: qty 1

## 2022-05-17 MED ORDER — PANTOPRAZOLE SODIUM 40 MG PO TBEC
40.0000 mg | DELAYED_RELEASE_TABLET | Freq: Every day | ORAL | Status: DC
  Administered 2022-05-18 – 2022-05-19 (×2): 40 mg via ORAL
  Filled 2022-05-17 (×2): qty 1

## 2022-05-17 MED ORDER — FENTANYL CITRATE PF 50 MCG/ML IJ SOSY
PREFILLED_SYRINGE | INTRAMUSCULAR | Status: AC
  Filled 2022-05-17: qty 3

## 2022-05-17 MED ORDER — FENTANYL CITRATE (PF) 250 MCG/5ML IJ SOLN
INTRAMUSCULAR | Status: AC
  Filled 2022-05-17: qty 5

## 2022-05-17 MED ORDER — EPHEDRINE SULFATE-NACL 50-0.9 MG/10ML-% IV SOSY
PREFILLED_SYRINGE | INTRAVENOUS | Status: DC | PRN
  Administered 2022-05-17 (×2): 5 mg via INTRAVENOUS

## 2022-05-17 MED ORDER — CHLORHEXIDINE GLUCONATE 0.12 % MT SOLN
15.0000 mL | Freq: Once | OROMUCOSAL | Status: AC
Start: 2022-05-17 — End: 2022-05-17
  Administered 2022-05-17: 15 mL via OROMUCOSAL

## 2022-05-17 MED ORDER — PROPOFOL 500 MG/50ML IV EMUL
INTRAVENOUS | Status: DC | PRN
  Administered 2022-05-17: 120 ug/kg/min via INTRAVENOUS

## 2022-05-17 MED ORDER — CYCLOSPORINE 0.05 % OP EMUL
1.0000 [drp] | Freq: Two times a day (BID) | OPHTHALMIC | Status: DC
Start: 2022-05-17 — End: 2022-05-19
  Administered 2022-05-17 – 2022-05-19 (×4): 1 [drp] via OPHTHALMIC
  Filled 2022-05-17 (×4): qty 30

## 2022-05-17 MED ORDER — DIPHENHYDRAMINE HCL 50 MG/ML IJ SOLN: 12.5000 mg | Freq: Four times a day (QID) | INTRAMUSCULAR | Status: DC | PRN

## 2022-05-17 MED ORDER — APREMILAST 30 MG PO TABS: 30.0000 mg | ORAL_TABLET | Freq: Two times a day (BID) | ORAL | Status: DC

## 2022-05-17 MED ORDER — LIOTHYRONINE SODIUM 25 MCG PO TABS
25.0000 ug | ORAL_TABLET | Freq: Every day | ORAL | Status: DC
  Administered 2022-05-18 – 2022-05-19 (×2): 25 ug via ORAL
  Filled 2022-05-17 (×2): qty 1

## 2022-05-17 MED ORDER — ALBUTEROL SULFATE (2.5 MG/3ML) 0.083% IN NEBU
2.5000 mg | INHALATION_SOLUTION | Freq: Four times a day (QID) | RESPIRATORY_TRACT | Status: DC | PRN
Start: 2022-05-17 — End: 2022-05-19

## 2022-05-17 MED ORDER — LIDOCAINE HCL (CARDIAC) PF 100 MG/5ML IV SOSY
PREFILLED_SYRINGE | INTRAVENOUS | Status: DC | PRN
  Administered 2022-05-17: 60 mg via INTRAVENOUS

## 2022-05-17 MED ORDER — TRAMADOL HCL 50 MG PO TABS
50.0000 mg | ORAL_TABLET | Freq: Four times a day (QID) | ORAL | Status: DC | PRN
  Administered 2022-05-17 (×2): 100 mg via ORAL
  Administered 2022-05-18: 50 mg via ORAL
  Filled 2022-05-17: qty 1
  Filled 2022-05-17 (×2): qty 2

## 2022-05-17 MED ORDER — ACETAMINOPHEN 500 MG PO TABS
1000.0000 mg | ORAL_TABLET | Freq: Once | ORAL | Status: AC
  Administered 2022-05-17: 1000 mg via ORAL
  Filled 2022-05-17: qty 2

## 2022-05-17 MED ORDER — PHENYLEPHRINE 80 MCG/ML (10ML) SYRINGE FOR IV PUSH (FOR BLOOD PRESSURE SUPPORT)
PREFILLED_SYRINGE | INTRAVENOUS | Status: DC | PRN
  Administered 2022-05-17 (×2): 120 ug via INTRAVENOUS

## 2022-05-17 MED ORDER — KETAMINE HCL 10 MG/ML IJ SOLN
INTRAMUSCULAR | Status: AC
  Filled 2022-05-17: qty 1

## 2022-05-17 MED ORDER — MIDAZOLAM HCL 2 MG/2ML IJ SOLN
INTRAMUSCULAR | Status: AC
  Filled 2022-05-17: qty 2

## 2022-05-17 MED ORDER — SODIUM CHLORIDE (PF) 0.9 % IJ SOLN
INTRAMUSCULAR | Status: AC
  Filled 2022-05-17: qty 20

## 2022-05-17 MED ORDER — ROCURONIUM BROMIDE 10 MG/ML (PF) SYRINGE
PREFILLED_SYRINGE | INTRAVENOUS | Status: AC
  Filled 2022-05-17: qty 10

## 2022-05-17 MED ORDER — ONDANSETRON HCL 4 MG/2ML IJ SOLN
4.0000 mg | INTRAMUSCULAR | Status: DC | PRN
  Administered 2022-05-17 – 2022-05-19 (×5): 4 mg via INTRAVENOUS
  Filled 2022-05-17 (×5): qty 2

## 2022-05-17 MED ORDER — SUGAMMADEX SODIUM 200 MG/2ML IV SOLN
INTRAVENOUS | Status: DC | PRN
  Administered 2022-05-17: 200 mg via INTRAVENOUS

## 2022-05-17 MED ORDER — FLUTICASONE PROPIONATE 50 MCG/ACT NA SUSP
1.0000 | Freq: Every day | NASAL | Status: DC
  Administered 2022-05-18 – 2022-05-19 (×2): 1 via NASAL
  Filled 2022-05-17 (×2): qty 16

## 2022-05-17 MED ORDER — ALPRAZOLAM 0.5 MG PO TABS
1.0000 mg | ORAL_TABLET | Freq: Every day | ORAL | Status: DC
  Administered 2022-05-17 – 2022-05-18 (×2): 1 mg via ORAL
  Filled 2022-05-17 (×2): qty 2

## 2022-05-17 MED ORDER — ONDANSETRON HCL 4 MG/2ML IJ SOLN
INTRAMUSCULAR | Status: AC
Start: 2022-05-17 — End: ?
  Filled 2022-05-17: qty 2

## 2022-05-17 MED ORDER — FENTANYL CITRATE PF 50 MCG/ML IJ SOSY
25.0000 ug | PREFILLED_SYRINGE | INTRAMUSCULAR | Status: DC | PRN
  Administered 2022-05-17: 50 ug via INTRAVENOUS

## 2022-05-17 MED ORDER — FENTANYL CITRATE (PF) 250 MCG/5ML IJ SOLN
INTRAMUSCULAR | Status: DC | PRN
Start: 2022-05-17 — End: 2022-05-17
  Administered 2022-05-17: 50 ug via INTRAVENOUS
  Administered 2022-05-17 (×2): 100 ug via INTRAVENOUS

## 2022-05-17 MED ORDER — LIDOCAINE HCL (PF) 2 % IJ SOLN
INTRAMUSCULAR | Status: AC
Start: 2022-05-17 — End: ?
  Filled 2022-05-17: qty 5

## 2022-05-17 MED ORDER — MECLIZINE HCL 25 MG PO TABS
25.0000 mg | ORAL_TABLET | Freq: Every day | ORAL | Status: DC | PRN
Start: 2022-05-17 — End: 2022-05-19
  Administered 2022-05-18: 25 mg via ORAL
  Filled 2022-05-17: qty 1

## 2022-05-17 MED ORDER — VANCOMYCIN HCL IN DEXTROSE 1-5 GM/200ML-% IV SOLN
1000.0000 mg | Freq: Two times a day (BID) | INTRAVENOUS | Status: AC
  Administered 2022-05-17: 1000 mg via INTRAVENOUS
  Filled 2022-05-17: qty 200

## 2022-05-17 MED ORDER — SODIUM CHLORIDE (PF) 0.9 % IJ SOLN
INTRAMUSCULAR | Status: DC | PRN
  Administered 2022-05-17: 10 mL

## 2022-05-17 MED ORDER — MOMETASONE FURO-FORMOTEROL FUM 200-5 MCG/ACT IN AERO
2.0000 | INHALATION_SPRAY | Freq: Two times a day (BID) | RESPIRATORY_TRACT | Status: DC
  Administered 2022-05-17 – 2022-05-18 (×3): 2 via RESPIRATORY_TRACT
  Filled 2022-05-17: qty 8.8

## 2022-05-17 MED ORDER — ACETAMINOPHEN 10 MG/ML IV SOLN
1000.0000 mg | Freq: Four times a day (QID) | INTRAVENOUS | Status: DC
  Administered 2022-05-17 – 2022-05-18 (×3): 1000 mg via INTRAVENOUS
  Filled 2022-05-17 (×3): qty 100

## 2022-05-17 MED ORDER — TRAMADOL HCL 50 MG PO TABS: 50.0000 mg | ORAL_TABLET | Freq: Four times a day (QID) | ORAL | 0 refills | Status: DC | PRN

## 2022-05-17 MED ORDER — BUPIVACAINE LIPOSOME 1.3 % IJ SUSP
INTRAMUSCULAR | Status: AC
  Filled 2022-05-17: qty 20

## 2022-05-17 MED ORDER — ESTRADIOL 1 MG PO TABS
1.0000 mg | ORAL_TABLET | Freq: Every day | ORAL | Status: DC
  Administered 2022-05-17 – 2022-05-18 (×2): 1 mg via ORAL
  Filled 2022-05-17 (×2): qty 1

## 2022-05-17 MED ORDER — VANCOMYCIN HCL IN DEXTROSE 1-5 GM/200ML-% IV SOLN
1000.0000 mg | Freq: Once | INTRAVENOUS | Status: AC
  Administered 2022-05-17: 1000 mg via INTRAVENOUS
  Filled 2022-05-17: qty 200

## 2022-05-17 MED ORDER — KETAMINE HCL 10 MG/ML IJ SOLN
INTRAMUSCULAR | Status: DC | PRN
  Administered 2022-05-17: 30 mg via INTRAVENOUS

## 2022-05-17 MED ORDER — ONDANSETRON HCL 4 MG/2ML IJ SOLN
INTRAMUSCULAR | Status: DC | PRN
  Administered 2022-05-17: 4 mg via INTRAVENOUS

## 2022-05-17 MED ORDER — IPRATROPIUM BROMIDE 0.03 % NA SOLN
1.0000 | Freq: Every day | NASAL | Status: DC | PRN
Start: 2022-05-18 — End: 2022-05-19
  Filled 2022-05-17: qty 30

## 2022-05-17 MED ORDER — ROCURONIUM BROMIDE 10 MG/ML (PF) SYRINGE
PREFILLED_SYRINGE | INTRAVENOUS | Status: DC | PRN
  Administered 2022-05-17: 20 mg via INTRAVENOUS
  Administered 2022-05-17: 60 mg via INTRAVENOUS

## 2022-05-17 SURGICAL SUPPLY — 62 items
ADH SKN CLS APL DERMABOND .7 (GAUZE/BANDAGES/DRESSINGS) ×2
AGENT HMST KT MTR STRL THRMB (HEMOSTASIS) ×1
APL ESCP 34 STRL LF DISP (HEMOSTASIS) ×1
APL PRP STRL LF DISP 70% ISPRP (MISCELLANEOUS) ×1
APPLICATOR SURGIFLO ENDO (HEMOSTASIS) ×1 IMPLANT
BAG COUNTER SPONGE SURGICOUNT (BAG) IMPLANT
BAG SPNG CNTER NS LX DISP (BAG)
CHLORAPREP W/TINT 26 (MISCELLANEOUS) ×2 IMPLANT
CLIP LIGATING HEM O LOK PURPLE (MISCELLANEOUS) ×2 IMPLANT
CLIP LIGATING HEMO O LOK GREEN (MISCELLANEOUS) ×4 IMPLANT
COVER SURGICAL LIGHT HANDLE (MISCELLANEOUS) ×2 IMPLANT
COVER TIP SHEARS 8 DVNC (MISCELLANEOUS) ×1 IMPLANT
COVER TIP SHEARS 8MM DA VINCI (MISCELLANEOUS) ×2
DERMABOND ADVANCED (GAUZE/BANDAGES/DRESSINGS) ×2
DERMABOND ADVANCED .7 DNX12 (GAUZE/BANDAGES/DRESSINGS) ×2 IMPLANT
DRAIN CHANNEL 15F RND FF 3/16 (WOUND CARE) ×1 IMPLANT
DRAPE ARM DVNC X/XI (DISPOSABLE) ×4 IMPLANT
DRAPE COLUMN DVNC XI (DISPOSABLE) ×1 IMPLANT
DRAPE DA VINCI XI ARM (DISPOSABLE) ×8
DRAPE DA VINCI XI COLUMN (DISPOSABLE) ×2
DRAPE INCISE IOBAN 66X45 STRL (DRAPES) ×2 IMPLANT
DRAPE SHEET LG 3/4 BI-LAMINATE (DRAPES) ×2 IMPLANT
ELECT PENCIL ROCKER SW 15FT (MISCELLANEOUS) ×2 IMPLANT
ELECT REM PT RETURN 15FT ADLT (MISCELLANEOUS) ×2 IMPLANT
EVACUATOR SILICONE 100CC (DRAIN) ×2 IMPLANT
GAUZE 4X4 16PLY ~~LOC~~+RFID DBL (SPONGE) ×2 IMPLANT
GLOVE BIO SURGEON STRL SZ 6.5 (GLOVE) ×2 IMPLANT
GLOVE SURG LX 7.5 STRW (GLOVE) ×2
GLOVE SURG LX STRL 7.5 STRW (GLOVE) ×2 IMPLANT
GOWN STRL REUS W/ TWL XL LVL3 (GOWN DISPOSABLE) ×1 IMPLANT
GOWN STRL REUS W/TWL XL LVL3 (GOWN DISPOSABLE) ×2
HEMOSTAT SURGICEL 4X8 (HEMOSTASIS) IMPLANT
HOLDER FOLEY CATH W/STRAP (MISCELLANEOUS) ×2 IMPLANT
IRRIG SUCT STRYKERFLOW 2 WTIP (MISCELLANEOUS) ×2
IRRIGATION SUCT STRKRFLW 2 WTP (MISCELLANEOUS) ×1 IMPLANT
KIT BASIN OR (CUSTOM PROCEDURE TRAY) ×2 IMPLANT
KIT TURNOVER KIT A (KITS) IMPLANT
PROTECTOR NERVE ULNAR (MISCELLANEOUS) ×4 IMPLANT
SEAL CANN UNIV 5-8 DVNC XI (MISCELLANEOUS) ×4 IMPLANT
SEAL XI 5MM-8MM UNIVERSAL (MISCELLANEOUS) ×8
SET TUBE SMOKE EVAC HIGH FLOW (TUBING) ×2 IMPLANT
SOLUTION ELECTROLUBE (MISCELLANEOUS) ×2 IMPLANT
SPIKE FLUID TRANSFER (MISCELLANEOUS) ×2 IMPLANT
SURGIFLO W/THROMBIN 8M KIT (HEMOSTASIS) ×2 IMPLANT
SUT ETHILON 3 0 PS 1 (SUTURE) ×2 IMPLANT
SUT MNCRL AB 4-0 PS2 18 (SUTURE) ×4 IMPLANT
SUT PDS PLUS 0 (SUTURE) ×6
SUT PDS PLUS AB 0 CT-2 (SUTURE) ×2 IMPLANT
SUT V-LOC BARB 180 2/0GR6 GS22 (SUTURE) ×2
SUT VIC AB 0 CT1 27 (SUTURE) ×2
SUT VIC AB 0 CT1 27XBRD ANTBC (SUTURE) ×1 IMPLANT
SUT VLOC BARB 180 ABS3/0GR12 (SUTURE) ×2
SUTURE V-LC BRB 180 2/0GR6GS22 (SUTURE) ×1 IMPLANT
SUTURE VLOC BRB 180 ABS3/0GR12 (SUTURE) ×1 IMPLANT
SYS BAG RETRIEVAL 10MM (BASKET) ×2
SYSTEM BAG RETRIEVAL 10MM (BASKET) ×1 IMPLANT
TOWEL OR 17X26 10 PK STRL BLUE (TOWEL DISPOSABLE) ×2 IMPLANT
TOWEL OR NON WOVEN STRL DISP B (DISPOSABLE) ×2 IMPLANT
TRAY FOLEY MTR SLVR 16FR STAT (SET/KITS/TRAYS/PACK) ×2 IMPLANT
TRAY LAPAROSCOPIC (CUSTOM PROCEDURE TRAY) ×2 IMPLANT
TROCAR ADV FIXATION 12X100MM (TROCAR) ×2 IMPLANT
WATER STERILE IRR 1000ML POUR (IV SOLUTION) ×2 IMPLANT

## 2022-05-17 NOTE — Anesthesia Procedure Notes (Signed)
Procedure Name: Intubation Date/Time: 05/17/2022 12:28 PM  Performed by: Raenette Rover, CRNAPre-anesthesia Checklist: Patient identified, Emergency Drugs available, Suction available and Patient being monitored Patient Re-evaluated:Patient Re-evaluated prior to induction Oxygen Delivery Method: Circle system utilized Preoxygenation: Pre-oxygenation with 100% oxygen Induction Type: IV induction Ventilation: Mask ventilation without difficulty Laryngoscope Size: Mac and 3 Grade View: Grade I Tube type: Oral Tube size: 7.0 mm Number of attempts: 1 Airway Equipment and Method: Stylet Placement Confirmation: ETT inserted through vocal cords under direct vision, positive ETCO2 and breath sounds checked- equal and bilateral Secured at: 22 cm Tube secured with: Tape Dental Injury: Teeth and Oropharynx as per pre-operative assessment

## 2022-05-17 NOTE — Transfer of Care (Signed)
Immediate Anesthesia Transfer of Care Note  Patient: Theresa Holloway  Procedure(s) Performed: XI ROBOTIC ASSITED PARTIAL NEPHRECTOMY (Left)  Patient Location: PACU  Anesthesia Type:General  Level of Consciousness: awake, drowsy and patient cooperative  Airway & Oxygen Therapy: Patient Spontanous Breathing and Patient connected to face mask oxygen  Post-op Assessment: Report given to RN and Post -op Vital signs reviewed and stable  Post vital signs: Reviewed and stable  Last Vitals:  Vitals Value Taken Time  BP 131/60 05/17/22 1442  Temp    Pulse 92 05/17/22 1444  Resp 19 05/17/22 1444  SpO2 100 % 05/17/22 1444  Vitals shown include unvalidated device data.  Last Pain:  Vitals:   05/17/22 0955  TempSrc:   PainSc: 0-No pain         Complications: No notable events documented.

## 2022-05-17 NOTE — Interval H&P Note (Signed)
History and Physical Interval Note:  05/17/2022 11:05 AM  Theresa Holloway  has presented today for surgery, with the diagnosis of LEFT RENAL NEOPLASM.  The various methods of treatment have been discussed with the patient and family. After consideration of risks, benefits and other options for treatment, the patient has consented to  Procedure(s): XI ROBOTIC ASSITED PARTIAL NEPHRECTOMY (Left) as a surgical intervention.  The patient's history has been reviewed, patient examined, no change in status, stable for surgery.  I have reviewed the patient's chart and labs.  Questions were answered to the patient's satisfaction.     [employer]

## 2022-05-17 NOTE — Anesthesia Postprocedure Evaluation (Signed)
Anesthesia Post Note  Patient: Theresa Holloway  Procedure(s) Performed: XI ROBOTIC ASSITED PARTIAL NEPHRECTOMY (Left)     Patient location during evaluation: PACU Anesthesia Type: General Level of consciousness: awake and alert Pain management: pain level controlled Vital Signs Assessment: post-procedure vital signs reviewed and stable Respiratory status: spontaneous breathing, nonlabored ventilation, respiratory function stable and patient connected to nasal cannula oxygen Cardiovascular status: blood pressure returned to baseline and stable Postop Assessment: no apparent nausea or vomiting Anesthetic complications: no   No notable events documented.  Last Vitals:  Vitals:   05/17/22 1600 05/17/22 1615  BP: 138/61 128/66  Pulse: 76 81  Resp:  14  Temp:  36.6 C  SpO2: 100% 100%    Last Pain:  Vitals:   05/17/22 1615  TempSrc:   PainSc: 3                  Charmian Forbis L Kaiyana Bedore

## 2022-05-17 NOTE — Anesthesia Preprocedure Evaluation (Addendum)
Anesthesia Evaluation  Patient identified by MRN, date of birth, ID band Patient awake    Reviewed: Allergy & Precautions, NPO status , Patient's Chart, lab work & pertinent test results  History of Anesthesia Complications (+) PONV and history of anesthetic complications  Airway Mallampati: I  TM Distance: >3 FB Neck ROM: Full    Dental no notable dental hx. (+) Teeth Intact, Dental Advisory Given   Pulmonary asthma , COPD,  COPD inhaler, former smoker,    Pulmonary exam normal breath sounds clear to auscultation       Cardiovascular + angina Normal cardiovascular exam Rhythm:Regular Rate:Normal     Neuro/Psych  Headaches, negative psych ROS   GI/Hepatic Neg liver ROS, GERD  ,  Endo/Other  Hypothyroidism   Renal/GU negative Renal ROS  negative genitourinary   Musculoskeletal negative musculoskeletal ROS (+)   Abdominal   Peds  Hematology negative hematology ROS (+)   Anesthesia Other Findings   Reproductive/Obstetrics                           Anesthesia Physical Anesthesia Plan  ASA: 3  Anesthesia Plan: General   Post-op Pain Management: Tylenol PO (pre-op)* and Ketamine IV*   Induction: Intravenous  PONV Risk Score and Plan: 4 or greater and Midazolam, Dexamethasone, Ondansetron and TIVA  Airway Management Planned: Oral ETT  Additional Equipment:   Intra-op Plan:   Post-operative Plan: Extubation in OR  Informed Consent: I have reviewed the patients History and Physical, chart, labs and discussed the procedure including the risks, benefits and alternatives for the proposed anesthesia with the patient or authorized representative who has indicated his/her understanding and acceptance.     Dental advisory given  Plan Discussed with: CRNA  Anesthesia Plan Comments: (2 IVs)      Anesthesia Quick Evaluation

## 2022-05-17 NOTE — Discharge Instructions (Signed)

## 2022-05-17 NOTE — Op Note (Signed)
Preoperative diagnosis: Left renal neoplasm  Postoperative diagnosis: Left renal neoplasm  Procedure:  Left robotic-assisted laparoscopic partial nephrectomy  Surgeon: Pryor Curia. M.D.  Assistant(s): Debbrah Alar, PA-C  An assistant was required for this surgical procedure.  The duties of the assistant included but were not limited to suctioning, passing suture, camera manipulation, retraction. This procedure would not be able to be performed without an Environmental consultant.  Anesthesia: General  Complications: None  EBL: 50 mL  IVF:  1400 mL crystalloid  Specimens: Left renal neoplasm  Disposition of specimens: Pathology  Intraoperative findings:       1. Warm renal ischemia time: 13 minutes  Drains: # 15 Blake perinephric drain  Indication:  Theresa Holloway is a 71 y.o. year old patient with a left renal neoplasm.  After a thorough review of the management options for their renal mass, they elected to proceed with surgical treatment and the above procedure.  We have discussed the potential benefits and risks of the procedure, side effects of the proposed treatment, the likelihood of the patient achieving the goals of the procedure, and any potential problems that might occur during the procedure or recuperation. Informed consent has been obtained.   Description of procedure:  The patient was taken to the operating room and a general anesthetic was administered. The patient was given preoperative antibiotics, placed in the left modified flank position with care to pad all potential pressure points, and prepped and draped in the usual sterile fashion. Next a preoperative timeout was performed.  A site was selected in the upper midline for initial port placement. This was placed using a standard open Hassan technique which allowed entry into the peritoneal cavity under direct vision and without difficulty. A 12 mm port was placed and a pneumoperitoneum established. The camera  was then used to inspect the abdomen and there was no evidence of any intra-abdominal injuries or other abnormalities. The remaining abdominal ports were then placed. 8 mm robotic ports were placed in the left upper quadrant, left lower quadrant, and far left lateral abdominal wall. A 8 mm port was placed to the left of the midline just off the rectus muscle for the camera.. All ports were placed under direct vision without difficulty. The surgical cart was then docked.   Utilizing the cautery scissors, the white line of Toldt was incised allowing the colon to be mobilized medially and the plane between the mesocolon and the anterior layer of Gerota's fascia to be developed and the kidney to be exposed.  The ureter and gonadal vein were identified inferiorly and the ureter was lifted anteriorly off the psoas muscle.  Dissection proceeded superiorly along the gonadal vein until the renal vein was identified.  The renal hilum was then carefully isolated with a combination of blunt and sharp dissection allowing the renal arterial and venous structures to be separated and isolated in preparation for renal hilar vessel clamping. There was a single renal artery and vein.   Attention turned to the kidney and the perinephric fat surrounding the renal mass was removed and the kidney was mobilized sufficiently for exposure and resection of the renal mass.   Once the renal mass was properly isolated, preparations were made for resection of the tumor.  Reconstructive sutures were placed into the abdomen for the renorrhaphy portion of the procedure.  The renal artery was then clamped with bulldog clamps.  The tumor was then excised with cold scissor dissection along with an adequate visible gross margin  of normal renal parenchyma. The tumor appeared to be excised without any gross violation of the tumor. The renal collecting system was not entered during removal of the tumor.  A running 3-0 V-lock suture was then brought  through the capsule of the kidney and run along the base of the renal defect to provide hemostasis and close any entry into the renal collecting system if present. Weck clips were used to secure this suture outside the renal capsule at the proximal and distal ends. An additional hemostatic agent (Surgiflo) was then placed into the renal defect. A running 2-0 V lock suture was then used to close the renal capsule using a sliding clip technique which resulted in excellent compression of the renal defect.    The bulldog clamps were then removed from the renal hilar vessel(s). Total warm renal ischemia time was 13 minutes. The renal tumor resection site was examined. Hemostasis appeared adequate.   The kidney was placed back into its normal anatomic position and covered with perinephric fat as needed.  A # 19 Blake drain was then brought through the lateral lower port site and positioned in the perinephric space.  It was secured to the skin with a nylon suture. The surgical cart was undocked.  The renal tumor specimen was removed intact within an endopouch retrieval bag via the upper midline port site.  All other laparoscopic/robotic ports had been removed under direct vision and the pneumoperitoneum let down with inspection of the operative field performed and hemostasis again confirmed. The upper midline incision was then closed at the fascial level with 0-vicryl suture. All incision sites were then injected with local anesthetic and reapproximated at the skin level with 4-0 monocryl subcuticular closures.  Dermabond was applied to the skin.  The patient tolerated the procedure well and without complications.  The patient was able to be extubated and transferred to the recovery unit in satisfactory condition.  Pryor Curia MD

## 2022-05-17 NOTE — Progress Notes (Signed)
Patient ID: Theresa Holloway, female   DOB: Jun 11, 1951, 71 y.o.   MRN: [id number]  Post-op note  Subjective: The patient is doing well.  No complaints.  Objective: Vital signs in last 24 hours: Temp:  [98.1 F (36.7 C)] 98.1 F (36.7 C) (08/03 0945) Pulse Rate:  [72-92] 72 (08/03 1530) Resp:  [9-19] 13 (08/03 1530) BP: (131-138)/(58-69) 132/66 (08/03 1530) SpO2:  [99 %-100 %] 100 % (08/03 1530) Weight:  [59.4 kg] 59.4 kg (08/03 0939)  Intake/Output from previous day: No intake/output data recorded. Intake/Output this shift: Total I/O In: 1600 [I.V.:1600] Out: 100 [Urine:50; Blood:50]  Physical Exam:  General: Alert and oriented. Abdomen: Soft, Nondistended. Incisions: Clean and dry.  Lab Results: No results for input(s): "HGB", "HCT" in the last 72 hours.  Assessment/Plan: POD#0   1) Continue to monitor, bedrest tonight   Theresa Holloway. MD   LOS: 0 days   Theresa Holloway 05/17/2022, 4:01 PM

## 2022-05-18 ENCOUNTER — Other Ambulatory Visit: Payer: Self-pay

## 2022-05-18 ENCOUNTER — Encounter (HOSPITAL_COMMUNITY): Payer: Self-pay | Admitting: Urology

## 2022-05-18 DIAGNOSIS — C642 Malignant neoplasm of left kidney, except renal pelvis: Secondary | ICD-10-CM | POA: Diagnosis present

## 2022-05-18 DIAGNOSIS — L409 Psoriasis, unspecified: Secondary | ICD-10-CM | POA: Diagnosis present

## 2022-05-18 DIAGNOSIS — Z87891 Personal history of nicotine dependence: Secondary | ICD-10-CM | POA: Diagnosis not present

## 2022-05-18 DIAGNOSIS — Z85528 Personal history of other malignant neoplasm of kidney: Secondary | ICD-10-CM | POA: Diagnosis not present

## 2022-05-18 DIAGNOSIS — E039 Hypothyroidism, unspecified: Secondary | ICD-10-CM | POA: Diagnosis present

## 2022-05-18 DIAGNOSIS — K219 Gastro-esophageal reflux disease without esophagitis: Secondary | ICD-10-CM | POA: Diagnosis present

## 2022-05-18 DIAGNOSIS — Z885 Allergy status to narcotic agent status: Secondary | ICD-10-CM | POA: Diagnosis not present

## 2022-05-18 DIAGNOSIS — Z880 Allergy status to penicillin: Secondary | ICD-10-CM | POA: Diagnosis not present

## 2022-05-18 DIAGNOSIS — J449 Chronic obstructive pulmonary disease, unspecified: Secondary | ICD-10-CM | POA: Diagnosis present

## 2022-05-18 DIAGNOSIS — Z85828 Personal history of other malignant neoplasm of skin: Secondary | ICD-10-CM | POA: Diagnosis not present

## 2022-05-18 DIAGNOSIS — Z79899 Other long term (current) drug therapy: Secondary | ICD-10-CM | POA: Diagnosis not present

## 2022-05-18 DIAGNOSIS — Z882 Allergy status to sulfonamides status: Secondary | ICD-10-CM | POA: Diagnosis not present

## 2022-05-18 DIAGNOSIS — D62 Acute posthemorrhagic anemia: Secondary | ICD-10-CM | POA: Diagnosis not present

## 2022-05-18 DIAGNOSIS — D49512 Neoplasm of unspecified behavior of left kidney: Secondary | ICD-10-CM | POA: Diagnosis present

## 2022-05-18 LAB — HEMOGLOBIN AND HEMATOCRIT, BLOOD
HCT: 28.7 % — ABNORMAL LOW (ref 36.0–46.0)
HCT: 29.9 % — ABNORMAL LOW (ref 36.0–46.0)
Hemoglobin: 9.2 g/dL — ABNORMAL LOW (ref 12.0–15.0)
Hemoglobin: 9.5 g/dL — ABNORMAL LOW (ref 12.0–15.0)

## 2022-05-18 LAB — CREATININE, FLUID (PLEURAL, PERITONEAL, JP DRAINAGE): Creat, Fluid: 0.9 mg/dL

## 2022-05-18 MED ORDER — METOCLOPRAMIDE HCL 5 MG/ML IJ SOLN
5.0000 mg | Freq: Four times a day (QID) | INTRAMUSCULAR | Status: DC
  Administered 2022-05-18 – 2022-05-19 (×2): 5 mg via INTRAVENOUS
  Filled 2022-05-18 (×2): qty 2

## 2022-05-18 MED ORDER — BISACODYL 10 MG RE SUPP
10.0000 mg | Freq: Once | RECTAL | Status: AC
  Administered 2022-05-18: 10 mg via RECTAL
  Filled 2022-05-18 (×2): qty 1

## 2022-05-18 MED ORDER — LORAZEPAM 0.5 MG PO TABS
0.2500 mg | ORAL_TABLET | Freq: Once | ORAL | Status: AC
Start: 2022-05-18 — End: 2022-05-18
  Administered 2022-05-18: 0.25 mg via ORAL
  Filled 2022-05-18: qty 1

## 2022-05-18 NOTE — Progress Notes (Signed)
Patient ID: Theresa Holloway, female   DOB: 08/30/1951, 71 y.o.   MRN: [id number]  1 Day Post-Op Subjective: Pain well controlled. No nausea or vomiting this morning.  Objective: Vital signs in last 24 hours: Temp:  [97.5 F (36.4 C)-98.1 F (36.7 C)] 97.9 F (36.6 C) (08/04 0456) Pulse Rate:  [72-99] 84 (08/04 0456) Resp:  [9-19] 17 (08/04 0456) BP: (114-139)/(58-70) 122/59 (08/04 0456) SpO2:  [94 %-100 %] 99 % (08/04 0456) Weight:  [59.4 kg] 59.4 kg (08/03 0939)  Intake/Output from previous day: 08/03 0701 - 08/04 0700 In: 2227.4 [P.O.:240; I.V.:1887.4; IV Piggyback:100] Out: 1400 [Urine:1150; Drains:200; Blood:50] Intake/Output this shift: No intake/output data recorded.  Physical Exam:  General: Alert and oriented CV: RRR Lungs: Clear Abdomen: Soft, ND Incisions: C/D/I GU: Urine clear Ext: NT, No erythema  Lab Results: Recent Labs    05/17/22 1557 05/18/22 0330  HGB 12.2 9.2*  HCT 39.2 28.7*   BMET Recent Labs    05/17/22 1557  NA 141  K 3.6  CL 107  CO2 25  GLUCOSE 98  BUN 15  CREATININE 0.82  CALCIUM 9.0     Studies/Results: No results found.  Assessment/Plan: POD # 1 s/p left RAL partial nephrectomy - Ambulate, IS - D/C Foley - Advance diet - Oral pain medication - Recheck Hgb later this morning - Decrease IVF   LOS: 0 days   Theresa Holloway 05/18/2022, 8:12 AM

## 2022-05-18 NOTE — Progress Notes (Signed)
Patient ID: Theresa Holloway, female   DOB: January 11, 1951, 71 y.o.   MRN: [id number]  She has had a lot of nausea today.  She has not eaten much and not ambulated.  No flatus yet.  Abd: Soft, minimal BS Inc: C/D/I  - Continue IVF - Encouraged ambulation - Will start [place] for discharge tomorrow if improved

## 2022-05-18 NOTE — Progress Notes (Signed)
Transition of Care (TOC) Screening Note  Patient Details  Name: Theresa Holloway Date of Birth: 10-02-1951  Transition of Care [facility]) CM/SW Contact:    Sherie Don, LCSW Phone Number: 05/18/2022, 11:25 AM  Transition of Care Department [facility]) has reviewed patient and no TOC needs have been identified at this time. We will continue to monitor patient advancement through interdisciplinary progression rounds. If new patient transition needs arise, please place a TOC consult.

## 2022-05-19 ENCOUNTER — Other Ambulatory Visit: Payer: Self-pay | Admitting: Urology

## 2022-05-19 DIAGNOSIS — D62 Acute posthemorrhagic anemia: Secondary | ICD-10-CM | POA: Diagnosis not present

## 2022-05-19 DIAGNOSIS — D49512 Neoplasm of unspecified behavior of left kidney: Secondary | ICD-10-CM

## 2022-05-19 LAB — BASIC METABOLIC PANEL
Anion gap: 8 (ref 5–15)
BUN: 11 mg/dL (ref 8–23)
CO2: 23 mmol/L (ref 22–32)
Calcium: 8.4 mg/dL — ABNORMAL LOW (ref 8.9–10.3)
Chloride: 105 mmol/L (ref 98–111)
Creatinine, Ser: 0.81 mg/dL (ref 0.44–1.00)
GFR, Estimated: >60 mL/min (ref >60)
Glucose, Bld: 117 mg/dL — ABNORMAL HIGH (ref 70–99)
Potassium: 3.1 mmol/L — ABNORMAL LOW (ref 3.5–5.1)
Sodium: 136 mmol/L (ref 135–145)

## 2022-05-19 LAB — CBC
HCT: 26.6 % — ABNORMAL LOW (ref 36.0–46.0)
Hemoglobin: 8.7 g/dL — ABNORMAL LOW (ref 12.0–15.0)
MCH: 30.1 pg (ref 26.0–34.0)
MCHC: 32.7 g/dL (ref 30.0–36.0)
MCV: 92 fL (ref 80.0–100.0)
Platelets: 328 10*3/uL (ref 150–400)
RBC: 2.89 MIL/uL — ABNORMAL LOW (ref 3.87–5.11)
RDW: 13.4 % (ref 11.5–15.5)
WBC: 11.4 10*3/uL — ABNORMAL HIGH (ref 4.0–10.5)
nRBC: 0 % (ref 0.0–0.2)

## 2022-05-19 MED ORDER — ONDANSETRON HCL 4 MG PO TABS: 4.0000 mg | ORAL_TABLET | Freq: Three times a day (TID) | ORAL | 1 refills | Status: AC | PRN

## 2022-05-19 NOTE — Progress Notes (Signed)
Patient ID: Theresa Holloway, female   DOB: [birth year], 71 y.o.   MRN: [id number]  2 Days Post-Op Subjective: Pain well controlled. No nausea or vomiting this morning.  She has not eaten yet.  She is voiding well.  Her Hgb has drifted down to 8.7.     ROS: Cough.  Otherwise negative.  Objective: Vital signs in last 24 hours: Temp:  [98.1 F (36.7 C)-99.2 F (37.3 C)] 98.4 F (36.9 C) (08/05 0600) Pulse Rate:  [77-99] 94 (08/05 0600) Resp:  [18-20] 18 (08/05 0600) BP: (125-141)/(53-88) 141/65 (08/05 0600) SpO2:  [98 %-100 %] 100 % (08/05 0600)  Intake/Output from previous day: 08/04 0701 - 08/05 0700 In: 3269.5 [P.O.:720; I.V.:2549.5] Out: 1655 [Urine:1625; Drains:30] Intake/Output this shift: Total I/O In: 1160.3 [I.V.:1160.3] Out: -   Physical Exam:  General: Alert and oriented CV: RRR Lungs: Clear Abdomen: Soft, ND Incisions: C/D/I Ext: NT, No erythema  Lab Results: Recent Labs    05/18/22 0330 05/18/22 1046 05/19/22 0608  HGB 9.2* 9.5* 8.7*  HCT 28.7* 29.9* 26.6*   BMET Recent Labs    05/17/22 1557 05/19/22 0608  NA 141 136  K 3.6 3.1*  CL 107 105  CO2 25 23  GLUCOSE 98 117*  BUN 15 11  CREATININE 0.82 0.81  CALCIUM 9.0 8.4*     Studies/Results: No results found.  Assessment/Plan: POD # 2 s/p left RAL partial nephrectomy - Ambulate, IS - Advance diet and consider DC when tolerating. - Oral pain medication - Recheck Hgb later this morning   LOS: 1 day   Irine Seal 05/19/2022, 9:04 AM   Patient ID: Theresa Holloway, female   DOB: [birth year], 71 y.o.   MRN: [id number]

## 2022-05-19 NOTE — Plan of Care (Signed)
DC paperwork reviewed with pt. Questions answered. PIV removed. Belongings gathered by pt's friend   Problem: Education: Goal: Knowledge of General Education information will improve Description: Including pain rating scale, medication(s)/side effects and non-pharmacologic comfort measures Outcome: Completed/Met   Problem: Health Behavior/Discharge Planning: Goal: Ability to manage health-related needs will improve Outcome: Completed/Met   Problem: Clinical Measurements: Goal: Ability to maintain clinical measurements within normal limits will improve Outcome: Completed/Met Goal: Will remain free from infection Outcome: Completed/Met Goal: Diagnostic test results will improve Outcome: Completed/Met Goal: Respiratory complications will improve Outcome: Completed/Met Goal: Cardiovascular complication will be avoided Outcome: Completed/Met   Problem: Activity: Goal: Risk for activity intolerance will decrease Outcome: Completed/Met   Problem: Nutrition: Goal: Adequate nutrition will be maintained Outcome: Completed/Met   Problem: Coping: Goal: Level of anxiety will decrease Outcome: Completed/Met   Problem: Elimination: Goal: Will not experience complications related to bowel motility Outcome: Completed/Met Goal: Will not experience complications related to urinary retention Outcome: Completed/Met   Problem: Pain Managment: Goal: General experience of comfort will improve Outcome: Completed/Met   Problem: Safety: Goal: Ability to remain free from injury will improve Outcome: Completed/Met   Problem: Skin Integrity: Goal: Risk for impaired skin integrity will decrease Outcome: Completed/Met

## 2022-05-19 NOTE — Discharge Summary (Signed)
Physician Discharge Summary  Patient ID: Theresa Holloway MRN: [id number] DOB/AGE: 1951/05/06 71 y.o.  Admit date: 05/17/2022 Discharge date: 05/19/2022  Admission Diagnoses:  Neoplasm of left kidney  Discharge Diagnoses:  Principal Problem:   Neoplasm of left kidney Active Problems:   Acute blood loss anemia   Past Medical History:  Diagnosis Date   Anginal pain ([place]) 03/22/2017   due to stress   Asthma    Basal cell carcinoma    Cancer of kidney ([place])    Chronic kidney disease 6389   cancer   Complication of anesthesia    PONV   COPD (chronic obstructive pulmonary disease) (HCC)    mild   GERD (gastroesophageal reflux disease)    History of back surgery    Hypothyroidism    Insomnia 01/29/2013   Pneumonia 2019   Postconcussion syndrome 01/29/2013   Psoriasis    Right ankle injury    Squamous cell carcinoma of skin 02/23/2019   in situ on left forearm, lower - tx p bx   Squamous cell carcinoma of skin 02/23/2019   in situ on left thigh, mid - tx p bx   Squamous cell carcinoma of skin 02/23/2019   in situ on left lower, inner shin - tx p bx    Surgeries: Procedure(s): XI ROBOTIC ASSITED PARTIAL NEPHRECTOMY on 05/17/2022   Consultants (if any):   Discharged Condition: Improved  Hospital Course: Theresa Holloway is an 71 y.o. female who was admitted 05/17/2022 with a diagnosis of Neoplasm of left kidney and went to the operating room on 05/17/2022 and underwent the above named procedures.  She had some post op nausea but that has resolved.  Her hgb was 9.2 on 8/4 in the AM and 8.7 this AM.   She ambulated and passed flatus and was felt to be ready for D/c.   She was given perioperative antibiotics:  Anti-infectives (From admission, onward)    Start     Dose/Rate Route Frequency Ordered Stop   05/17/22 2300  vancomycin (VANCOCIN) IVPB 1000 mg/200 mL premix        1,000 mg 200 mL/hr over 60 Minutes Intravenous Every 12 hours 05/17/22 1637 05/18/22 1207   05/17/22 0945   vancomycin (VANCOCIN) IVPB 1000 mg/200 mL premix        1,000 mg 200 mL/hr over 60 Minutes Intravenous  Once 05/17/22 0935 05/17/22 1114     BP (!) 141/65 (BP Location: Left Arm)   Pulse 94   Temp 98.4 F (36.9 C)   Resp 18   Ht 5' 6.5" (1.689 m)   Wt 59.4 kg   SpO2 100%   BMI 20.82 kg/m   Gen: WD, WN in NAD, A/O x 3. Lungs: CTA CV: RRR. GI: soft, flat with + BS. Incisions intact without erythema.   She was given sequential compression devices for DVT prophylaxis.  She benefited maximally from the hospital stay and there were no complications.    Recent vital signs:  Vitals:   05/18/22 2120 05/19/22 0600  BP: (!) 136/53 (!) 141/65  Pulse: 92 94  Resp: 18 18  Temp: 99.2 F (37.3 C) 98.4 F (36.9 C)  SpO2: 100% 100%    Recent laboratory studies:  Lab Results  Component Value Date   HGB 8.7 (L) 05/19/2022   HGB 9.5 (L) 05/18/2022   HGB 9.2 (L) 05/18/2022   Lab Results  Component Value Date   WBC 11.4 (H) 05/19/2022   PLT 328 05/19/2022   Lab  Results  Component Value Date   INR 1.2 05/18/2021   Lab Results  Component Value Date   NA 136 05/19/2022   K 3.1 (L) 05/19/2022   CL 105 05/19/2022   CO2 23 05/19/2022   BUN 11 05/19/2022   CREATININE 0.81 05/19/2022   GLUCOSE 117 (H) 05/19/2022    Discharge Medications:   Allergies as of 05/19/2022       Reactions   Bee Venom Anaphylaxis, Swelling   Codeine Anaphylaxis   Morphine And Related Anaphylaxis   Penicillins Anaphylaxis   Sulfa Antibiotics Anaphylaxis   Other    Cat gut suture - too long to heal    Prednisone    Excessive weight gain - 35lbs in 10 days   Skyrizi [risankizumab] Nausea And Vomiting   Pt also got blisters when taking this med        Medication List     STOP taking these medications    aspirin-acetaminophen-caffeine 250-250-65 MG tablet Commonly known as: EXCEDRIN MIGRAINE       TAKE these medications    Albuterol Sulfate 2.5 MG/0.5ML Nebu Take 2.5 mg by  nebulization every 6 (six) hours as needed (Asthma).   ALPRAZolam 1 MG tablet Commonly known as: XANAX Take 1 mg by mouth at bedtime.   cetirizine 10 MG tablet Commonly known as: ZYRTEC Take 10 mg by mouth daily.   docusate sodium 100 MG capsule Commonly known as: COLACE Take 1 capsule (100 mg total) by mouth 2 (two) times daily.   Doxepin HCl 5 % Crea APPLY 1 APPLICATION. TOPICALLY DAILY.   estradiol 1 MG tablet Commonly known as: ESTRACE Take 1 tablet (1 mg total) by mouth daily. What changed: when to take this   fluticasone 50 MCG/ACT nasal spray Commonly known as: FLONASE Place 1 spray into both nostrils in the morning and at bedtime.   imiquimod 5 % cream Commonly known as: Aldara Apply to legs Mon, Wed, Fri, x 6 weeks   ipratropium 0.03 % nasal spray Commonly known as: ATROVENT Place 1 spray into both nostrils daily as needed for rhinitis.   liothyronine 25 MCG tablet Commonly known as: CYTOMEL Take 25 mcg by mouth daily.   meclizine 25 MG tablet Commonly known as: ANTIVERT Take 25 mg by mouth daily as needed for dizziness.   montelukast 10 MG tablet Commonly known as: SINGULAIR Take 10 mg by mouth at bedtime.   ondansetron 4 MG tablet Commonly known as: Zofran Take 1 tablet (4 mg total) by mouth every 8 (eight) hours as needed for nausea or vomiting.   Otezla 30 MG Tabs Generic drug: Apremilast TAKE 1 TABLET BY MOUTH 2 TIMES A DAY.   pantoprazole 40 MG tablet Commonly known as: PROTONIX Take 40 mg by mouth daily.   Restasis 0.05 % ophthalmic emulsion Generic drug: cycloSPORINE Place 1 drop into both eyes 2 (two) times daily.   Symbicort 160-4.5 MCG/ACT inhaler Generic drug: budesonide-formoterol Inhale 2 puffs into the lungs 2 (two) times daily as needed (asthma).   traMADol 50 MG tablet Commonly known as: Ultram Take 1-2 tablets (50-100 mg total) by mouth every 6 (six) hours as needed for moderate pain or severe pain.        Diagnostic  Studies: No results found.  Disposition: Discharge disposition: 01-Home or Self Care          Follow-up Information     Raynelle Bring, MD Follow up on 06/12/2022.   Specialty: Urology Why: at 10:30 Contact information: [phone]  [place] [address] [phone]                  Signed: Irine Seal 05/19/2022, 12:06 PM

## 2022-05-21 LAB — SURGICAL PATHOLOGY

## 2022-05-30 ENCOUNTER — Telehealth: Payer: Self-pay

## 2022-05-30 NOTE — Telephone Encounter (Signed)
Spoke with patient to make sure she knew the office is closing due to a preauth. we received on her otezla. She was unaware of the closure due to her kidney cancer and procedure.I confirmed she was still taking otezla and its keeping her clear.I advised her that samples were available to help her until she could find care. I faxed the preauth and office note to [facility]  fax [phone]  [place] [phone]

## 2022-06-11 NOTE — Telephone Encounter (Signed)
[facility] approval 05/31/22- 06-01-2023  [phone]

## 2022-06-12 ENCOUNTER — Other Ambulatory Visit: Payer: Self-pay | Admitting: Urology

## 2022-06-12 ENCOUNTER — Ambulatory Visit
Admission: RE | Admit: 2022-06-12 | Discharge: 2022-06-12 | Disposition: A | Discharge status: Home / Self Care | Payer: 59 | Source: Ambulatory Visit | Attending: Urology | Admitting: Urology

## 2022-06-12 DIAGNOSIS — C642 Malignant neoplasm of left kidney, except renal pelvis: Secondary | ICD-10-CM

## 2022-06-12 DIAGNOSIS — R1012 Left upper quadrant pain: Secondary | ICD-10-CM

## 2022-06-12 MED ORDER — IOPAMIDOL (ISOVUE-300) INJECTION 61%
100.0000 mL | Freq: Once | INTRAVENOUS | Status: AC | PRN
  Administered 2022-06-12: 100 mL via INTRAVENOUS

## 2022-06-25 ENCOUNTER — Other Ambulatory Visit: Payer: Self-pay | Admitting: Dermatology

## 2022-08-03 ENCOUNTER — Other Ambulatory Visit: Payer: Self-pay | Admitting: Family Medicine

## 2022-08-03 DIAGNOSIS — Z139 Encounter for screening, unspecified: Secondary | ICD-10-CM

## 2022-08-25 ENCOUNTER — Ambulatory Visit
Admission: EM | Admit: 2022-08-25 | Discharge: 2022-08-25 | Disposition: A | Discharge status: Home / Self Care | Payer: 59 | Attending: Nurse Practitioner | Admitting: Nurse Practitioner

## 2022-08-25 DIAGNOSIS — J0140 Acute pansinusitis, unspecified: Secondary | ICD-10-CM

## 2022-08-25 MED ORDER — DOXYCYCLINE HYCLATE 100 MG PO TABS: 100.0000 mg | ORAL_TABLET | Freq: Two times a day (BID) | ORAL | 0 refills | Status: DC

## 2022-08-25 NOTE — Discharge Instructions (Addendum)
Take medication as directed. Continue the Antivert you are currently taking for your vertigo symptoms. Continue your current allergy medication regimen. Increase fluids and get plenty of rest. May take over-the-counter ibuprofen or Tylenol as needed for pain, fever, or general discomfort. Recommend normal saline nasal spray to help with nasal congestion throughout the day. For your cough, it may be helpful to use a humidifier at bedtime during sleep. If your symptoms fail to improve within the next 7 to 10 days, please follow-up in our clinic.

## 2022-08-25 NOTE — ED Triage Notes (Signed)
Pt reports she had a sinus infection and took some sinus meds and some antivert about a week ago.  She began to feel dizzy she couldn't even get out of the building or drive because she was to dizzy. She has severe head pressure. Turning her head or moving to much makes her dizzy.  Pt fell yesterday  walking out of her door due to dizziness  Patient has vertigo.

## 2022-08-25 NOTE — ED Provider Notes (Signed)
[facility]    CSN: [id number] Arrival date & time: 08/25/22  1052      History   Chief Complaint No chief complaint on file.   HPI Theresa Holloway is a 71 y.o. female.   The history is provided by the patient.   Presents stating "I have a sinus infection, and its making my dizziness worse".  Symptoms started over the last week.  She states over the past 24 to 48 hours, she has had worsening dizziness due to extreme facial pain and ear pressure and fullness.  She states that she does have nasal congestion as well.  She denies fever, chills, blurred vision, change in vision, chest pain, difficulty breathing, shortrtness of breath, or difficulty breathing.  Patient reports that she has a history of recurrent sinus infections.  She states when they get so bad, they make her vertigo worse, causing her to have dizziness.  Patient states that she fell 1 day ago because her dizziness was so bad, and states that she had to have someone drive her here this morning.  She states that she took Antivert for her symptoms, which did not help.  She has been taking allergy medicine along with Flonase for her symptoms with minimal relief.  Past Medical History:  Diagnosis Date   Anginal pain ([place]) 03/22/2017   due to stress   Asthma    Basal cell carcinoma    Cancer of kidney ([place])    Chronic kidney disease 1660   cancer   Complication of anesthesia    PONV   COPD (chronic obstructive pulmonary disease) (HCC)    mild   GERD (gastroesophageal reflux disease)    History of back surgery    Hypothyroidism    Insomnia 01/29/2013   Pneumonia 2019   Postconcussion syndrome 01/29/2013   Psoriasis    Right ankle injury    Squamous cell carcinoma of skin 02/23/2019   in situ on left forearm, lower - tx p bx   Squamous cell carcinoma of skin 02/23/2019   in situ on left thigh, mid - tx p bx   Squamous cell carcinoma of skin 02/23/2019   in situ on left lower, inner shin - tx p bx     Patient Active Problem List   Diagnosis Date Noted   Acute blood loss anemia 05/19/2022   Neoplasm of left kidney 05/17/2022   Bee sting 04/10/2022   Encounter for screening fecal occult blood testing 04/10/2022   S/P hysterectomy 04/10/2022   Sepsis ([place]) 05/15/2021   Sepsis secondary to UTI ([place]) 05/15/2021   Hypokalemia 05/15/2021   AKI (acute kidney injury) ([place]) 05/15/2021   Bladder prolapse, female, acquired 05/15/2021   Complete prolapse of vaginal vault 04/06/2021   Encounter for well woman exam with routine gynecological exam 04/05/2020   Screening for colorectal cancer 04/06/2019   Pain in left hip 06/18/2018   Trochanteric bursitis, left hip 06/18/2018   History of lumbar spinal fusion 06/18/2018   Weight gain 01/13/2018   Hot flashes 01/13/2018   Thyroid nodule 01/13/2018   Encounter for gynecological examination 01/13/2018   Current use of estrogen therapy 01/13/2018   Rectocele 01/13/2018   Chest pressure 02/28/2017   SOB (shortness of breath) 02/28/2017   Fatigue 02/28/2017   Cold extremities 02/28/2017   Postconcussion syndrome 01/29/2013   Headache(784.0) 01/29/2013   Insomnia 01/29/2013   Sinus tarsi syndrome 05/02/2011   Pain in joint, ankle and foot 05/02/2011    Past Surgical History:  Procedure Laterality Date   ABDOMINAL HYSTERECTOMY  1995   BACK SURGERY  2010   x3   BREAST BIOPSY Left 2004   negative   CHOLECYSTECTOMY  1996   CYSTOSCOPY W/ URETERAL STENT PLACEMENT Left 05/24/2021   Procedure: CYSTOSCOPY WITH RETROGRADE PYELOGRAM/URETERAL STENT PLACEMENT;  Surgeon: [name], MD;  Location: [facility];  Service: Urology;  Laterality: Left;   CYSTOSCOPY WITH STENT PLACEMENT Bilateral 05/18/2021   Procedure: CYSTOSCOPY, BILATERAL RETROGRADE PYELOGRAM,  RIGHT STENT PLACEMENT, ATTEMPTED LEFT STENT PLACEMENT;  Surgeon: Janith Lima, MD;  Location: [facility];  Service: Urology;  Laterality: Bilateral;   ELBOW SURGERY Right 1997   IR  NEPHROSTOMY PLACEMENT LEFT  05/19/2021   left foot surgery  2012   NEPHRECTOMY  2009   right foot surgery  2015   right knee surgery  1979   right ring finger surgery  2002   ROBOTIC ASSISTED LAPAROSCOPIC SACROCOLPOPEXY N/A 05/24/2021   Procedure: XI ROBOTIC ASSISTED LAPAROSCOPIC SACROCOLPOPEXY;  Surgeon: [name], MD;  Location: [facility];  Service: Urology;  Laterality: N/A;   ROBOTIC ASSITED PARTIAL NEPHRECTOMY Left 05/17/2022   Procedure: XI ROBOTIC ASSITED PARTIAL NEPHRECTOMY;  Surgeon: Raynelle Bring, MD;  Location: [facility];  Service: Urology;  Laterality: Left;    OB History     Gravida  0   Para  0   Term  0   Preterm  0   AB  0   Living  0      SAB  0   IAB  0   Ectopic  0   Multiple  0   Live Births  0            Home Medications    Prior to Admission medications   Medication Sig Start Date End Date Taking? Authorizing Provider  doxycycline (VIBRA-TABS) 100 MG tablet Take 1 tablet (100 mg total) by mouth 2 (two) times daily. 08/25/22  Yes Roye Gustafson-Warren, Alda Lea, NP  Albuterol Sulfate 2.5 MG/0.5ML NEBU Take 2.5 mg by nebulization every 6 (six) hours as needed (Asthma). 04/05/22   [provider]  ALPRAZolam Duanne Moron) 1 MG tablet Take 1 mg by mouth at bedtime.  01/20/15   [provider]  cetirizine (ZYRTEC) 10 MG tablet Take 10 mg by mouth daily.    [provider]  docusate sodium (COLACE) 100 MG capsule Take 1 capsule (100 mg total) by mouth 2 (two) times daily. Patient not taking: Reported on 05/02/2022 05/24/21   Debbrah Alar, PA-C  Doxepin HCl 5 % CREA APPLY 1 APPLICATION. TOPICALLY DAILY. Patient not taking: Reported on 05/02/2022 12/27/21   Lavonna Monarch, MD  estradiol (ESTRACE) 1 MG tablet Take 1 tablet (1 mg total) by mouth daily. Patient taking differently: Take 1 mg by mouth at bedtime. 04/10/22   Estill Dooms, NP  fluticasone (FLONASE) 50 MCG/ACT nasal spray Place 1 spray into both nostrils in the morning  and at bedtime. 03/06/21   [provider]  imiquimod Leroy Sea) 5 % cream Apply to legs Mon, Wed, Fri, x 6 weeks Patient not taking: Reported on 05/02/2022 08/03/21   Lavonna Monarch, MD  ipratropium (ATROVENT) 0.03 % nasal spray Place 1 spray into both nostrils daily as needed for rhinitis. 05/11/21   [provider]  liothyronine (CYTOMEL) 25 MCG tablet Take 25 mcg by mouth daily. 01/08/21   [provider]  meclizine (ANTIVERT) 25 MG tablet Take 25 mg by mouth daily as needed for dizziness.  [provider]  montelukast (SINGULAIR) 10 MG tablet Take 10 mg by mouth at bedtime. 02/07/21   [provider]  ondansetron (ZOFRAN) 4 MG tablet Take 1 tablet (4 mg total) by mouth every 8 (eight) hours as needed for nausea or vomiting. 05/19/22   Irine Seal, MD  OTEZLA 30 MG TABS TAKE 1 TABLET BY MOUTH 2 TIMES A DAY. 02/28/22   Lavonna Monarch, MD  pantoprazole (PROTONIX) 40 MG tablet Take 40 mg by mouth daily.    [provider]  RESTASIS 0.05 % ophthalmic emulsion Place 1 drop into both eyes 2 (two) times daily. 11/26/20   [provider]  SYMBICORT 160-4.5 MCG/ACT inhaler Inhale 2 puffs into the lungs 2 (two) times daily as needed (asthma). 01/27/21   [provider]  traMADol (ULTRAM) 50 MG tablet Take 1-2 tablets (50-100 mg total) by mouth every 6 (six) hours as needed for moderate pain or severe pain. 05/17/22   Debbrah Alar, PA-C    Family History Family History  Problem Relation Age of Onset   Heart failure Mother    COPD Mother    Heart attack Father    Breast cancer Neg Hx     Social History Social History   Tobacco Use   Smoking status: Former    Packs/day: 1.00    Years: 10.00    Total pack years: 10.00    Types: Cigarettes    Quit date: 2007    Years since quitting: 16.8   Smokeless tobacco: Never  Vaping Use   Vaping Use: Never used  Substance Use Topics   Alcohol use: No   Drug use: No     Allergies    Bee venom, Codeine, Morphine and related, Penicillins, Sulfa antibiotics, Other, Prednisone, and Skyrizi [risankizumab]   Review of Systems Review of Systems Per HPI  Physical Exam Triage Vital Signs ED Triage Vitals  Enc Vitals Group     BP 08/25/22 1117 129/77     Pulse Rate 08/25/22 1117 84     Resp 08/25/22 1117 20     Temp 08/25/22 1117 97.9 F (36.6 C)     Temp Source 08/25/22 1117 Oral     SpO2 08/25/22 1117 98 %     Weight --      Height --      Head Circumference --      Peak Flow --      Pain Score 08/25/22 1123 0     Pain Loc --      Pain Edu? --      Excl. in [place]? --    No data found.  Updated Vital Signs BP 129/77 (BP Location: Right Arm)   Pulse 84   Temp 97.9 F (36.6 C) (Oral)   Resp 20   SpO2 98%   Visual Acuity Right Eye Distance:   Left Eye Distance:   Bilateral Distance:    Right Eye Near:   Left Eye Near:    Bilateral Near:     Physical Exam Vitals and nursing note reviewed.  Constitutional:      General: She is not in acute distress.    Appearance: Normal appearance. She is ill-appearing.  HENT:     Head: Normocephalic.     Right Ear: Tympanic membrane, ear canal and external ear normal.     Left Ear: Tympanic membrane, ear canal and external ear normal.     Nose: Congestion present. No rhinorrhea.     Right Turbinates: Enlarged  and swollen.     Left Turbinates: Enlarged and swollen.     Right Sinus: Maxillary sinus tenderness and frontal sinus tenderness present.     Left Sinus: Maxillary sinus tenderness and frontal sinus tenderness present.     Mouth/Throat:     Lips: Pink.     Mouth: Mucous membranes are moist.     Pharynx: Uvula midline. Posterior oropharyngeal erythema present. No pharyngeal swelling, oropharyngeal exudate or uvula swelling.     Comments: Cobblestoning present on posterior tongue Eyes:     Extraocular Movements: Extraocular movements intact.     Conjunctiva/sclera: Conjunctivae normal.     Pupils: Pupils  are equal, round, and reactive to light.  Cardiovascular:     Rate and Rhythm: Normal rate.     Pulses: Normal pulses.     Heart sounds: Normal heart sounds.  Pulmonary:     Effort: Pulmonary effort is normal. No respiratory distress.     Breath sounds: Normal breath sounds. No stridor. No wheezing, rhonchi or rales.  Abdominal:     General: Bowel sounds are normal.     Palpations: Abdomen is soft.  Musculoskeletal:     Cervical back: Normal range of motion.  Lymphadenopathy:     Cervical: No cervical adenopathy.  Skin:    General: Skin is warm and dry.  Neurological:     General: No focal deficit present.     Mental Status: She is alert and oriented to person, place, and time.  Psychiatric:        Mood and Affect: Mood normal.        Behavior: Behavior normal.      UC Treatments / Results  Labs (all labs ordered are listed, but only abnormal results are displayed) Labs Reviewed - No data to display  EKG   Radiology No results found.  Procedures Procedures (including critical care time)  Medications Ordered in UC Medications - No data to display  Initial Impression / Assessment and Plan / UC Course  I have reviewed the triage vital signs and the nursing notes.  Pertinent labs & imaging results that were available during my care of the patient were reviewed by me and considered in my medical decision making (see chart for details).  Patient presents for complaints of dizziness exacerbated by an underlying sinus infection.  Patient appears" uncomfortable" due to the dizziness she is experiencing.  On exam, she has moderate maxillary and frontal sinus tenderness.  As it is not uncommon to experience dizziness and vertigo-like symptoms with acute sinusitis, will treat patient with doxycycline 100 mg for her sinus infection.  With regard to her vertigo, will have her continue the Antivert she is currently taking.  Discussed with patient that she will need to go to the  emergency department if her symptoms worsen and do not improve with the antibiotic.  Patient verbalized understanding.  All questions were answered.  Patient is stable for discharge. Final Clinical Impressions(s) / UC Diagnoses   Final diagnoses:  Acute pansinusitis, recurrence not specified     Discharge Instructions      Take medication as directed. Continue the Antivert you are currently taking for your vertigo symptoms. Continue your current allergy medication regimen. Increase fluids and get plenty of rest. May take over-the-counter ibuprofen or Tylenol as needed for pain, fever, or general discomfort. Recommend normal saline nasal spray to help with nasal congestion throughout the day. For your cough, it may be helpful to use a humidifier at bedtime during  sleep. If your symptoms fail to improve within the next 7 to 10 days, please follow-up in our clinic.      ED Prescriptions     Medication Sig Dispense Auth. Provider   doxycycline (VIBRA-TABS) 100 MG tablet Take 1 tablet (100 mg total) by mouth 2 (two) times daily. 20 tablet Romond Pipkins-Warren, Alda Lea, NP      PDMP not reviewed this encounter.   Tish Men, NP 08/25/22 1216

## 2022-09-14 HISTORY — PX: TOTAL HIP ARTHROPLASTY: SHX124

## 2022-09-24 ENCOUNTER — Ambulatory Visit
Admission: RE | Admit: 2022-09-24 | Discharge: 2022-09-24 | Disposition: A | Discharge status: Home / Self Care | Payer: 59 | Source: Ambulatory Visit | Attending: Family Medicine | Admitting: Family Medicine

## 2022-09-24 DIAGNOSIS — Z139 Encounter for screening, unspecified: Secondary | ICD-10-CM

## 2022-10-02 ENCOUNTER — Inpatient Hospital Stay: Admit: 2022-10-02 | Payer: 59 | Admitting: Internal Medicine

## 2022-10-02 ENCOUNTER — Encounter (HOSPITAL_COMMUNITY): Payer: Self-pay

## 2023-04-15 ENCOUNTER — Ambulatory Visit (INDEPENDENT_AMBULATORY_CARE_PROVIDER_SITE_OTHER): Payer: 59 | Admitting: Adult Health

## 2023-04-15 ENCOUNTER — Encounter: Payer: Self-pay | Admitting: Adult Health

## 2023-04-15 VITALS — BP 117/63 | HR 92 | Ht 66.5 in | Wt 135.0 lb

## 2023-04-15 DIAGNOSIS — Z90710 Acquired absence of both cervix and uterus: Secondary | ICD-10-CM

## 2023-04-15 DIAGNOSIS — Z01419 Encounter for gynecological examination (general) (routine) without abnormal findings: Secondary | ICD-10-CM

## 2023-04-15 DIAGNOSIS — Z1211 Encounter for screening for malignant neoplasm of colon: Secondary | ICD-10-CM

## 2023-04-15 DIAGNOSIS — Z1212 Encounter for screening for malignant neoplasm of rectum: Secondary | ICD-10-CM

## 2023-04-15 DIAGNOSIS — Z79899 Other long term (current) drug therapy: Secondary | ICD-10-CM | POA: Diagnosis not present

## 2023-04-15 DIAGNOSIS — N816 Rectocele: Secondary | ICD-10-CM

## 2023-04-15 LAB — HEMOCCULT GUIAC POC 1CARD (OFFICE): Fecal Occult Blood, POC: NEGATIVE

## 2023-04-15 MED ORDER — ESTRADIOL 1 MG PO TABS: 1.0000 mg | ORAL_TABLET | Freq: Every day | ORAL | 4 refills | Status: DC

## 2023-04-15 NOTE — Progress Notes (Signed)
Patient ID: Theresa Holloway, female   DOB: [birth year], 72 y.o.   MRN: [id number] History of Present Illness: Theresa Holloway is a 72 year old white female, divorced, sp hysterectomy in for a well woman gyn exam and to refill estrace 1 mg. She feel 10/02/22 and fractured left hip and had replacement.  She is still working.  PCP is Dr Phillips Odor   Current Medications, Allergies, Past Medical History, Past Surgical History, Family History and Social History were reviewed in [employer] electronic medical record.     Review of Systems: Patient denies any headaches, hearing loss, fatigue, blurred vision, shortness of breath, chest pain, abdominal pain, problems with bowel movements, urination, or intercourse. No joint pain or mood swings.  Has hot flashes if does not take Estrace    Physical Exam:BP 117/63 (BP Location: Left Arm, Patient Position: Sitting, Cuff Size: Normal)   Pulse 92   Ht 5' 6.5" (1.689 m)   Wt 135 lb (61.2 kg)   BMI 21.46 kg/m   General:  Well developed, well nourished, no acute distress Skin:  Warm and dry Neck:  Midline trachea, normal thyroid, good ROM, no lymphadenopathy,no carotid bruits heard Lungs; Clear to auscultation bilaterally Breast:  No dominant palpable mass, retraction, or nipple discharge Cardiovascular: Regular rate and rhythm Abdomen:  Soft, non tender, no hepatosplenomegaly Pelvic:  External genitalia is normal in appearance, no lesions.  The vagina is pale. Urethra has no lesions or masses. The cervix and uterus are absent.  No adnexal masses or tenderness noted.Bladder is non tender, no masses felt. Rectal: Good sphincter tone, no polyps, or hemorrhoids felt.  Hemoccult negative.+rectocele Extremities/musculoskeletal:  No swelling or varicosities noted, no clubbing or cyanosis Psych:  No mood changes, alert and cooperative,seems happy AA is 0 Fall risk is moderate    04/15/2023    2:36 PM 04/10/2022    3:33 PM 04/06/2021    4:06 PM  Depression screen PHQ  2/9  Decreased Interest 0 0 3  Down, Depressed, Hopeless 0 0 1  PHQ - 2 Score 0 0 4  Altered sleeping 0 0 2  Tired, decreased energy 0 0 2  Change in appetite 0 0 1  Feeling bad or failure about yourself  0 0 0  Trouble concentrating 0 0 0  Moving slowly or fidgety/restless 0 0 0  Suicidal thoughts 0 0 0  PHQ-9 Score 0 0 9       04/15/2023    2:37 PM 04/10/2022    3:33 PM 04/06/2021    4:06 PM 04/05/2020    2:55 PM  GAD 7 : Generalized Anxiety Score  Nervous, Anxious, on Edge 0 0 1 0  Control/stop worrying 0 0 1 0  Worry too much - different things 0 0 1 0  Trouble relaxing 0 0 1 0  Restless 0 0 0 0  Easily annoyed or irritable 0 0 1 0  Afraid - awful might happen 0 0 0 0  Total GAD 7 Score 0 0 5 0      Upstream - 04/15/23 1445       Pregnancy Intention Screening   Does the patient want to become pregnant in the next year? N/A    Does the patient's partner want to become pregnant in the next year? N/A    Would the patient like to discuss contraceptive options today? N/A      Contraception Wrap Up   Current Method Female Sterilization   hyst   End Method Female  Sterilization   hyst   Contraception Counseling Provided No            Examination chaperoned by Malachy Mood LPN   Impression and Plan: 1. Encounter for well woman exam with routine gynecological exam Physical in 1 year Mammogram was negative 12/11.23 Labs with PCP She declines DEXA  2. Current use of estrogen therapy She is aware of risks and wants to continue Meds ordered this encounter  Medications   estradiol (ESTRACE) 1 MG tablet    Sig: Take 1 tablet (1 mg total) by mouth daily.    Dispense:  90 tablet    Refill:  4    Order Specific Question:   Supervising Provider    Answer:   Despina Hidden, LUTHER H [[id number]]     3. Rectocele  4. S/P hysterectomy  5. Encounter for screening fecal occult blood testing Hemoccult was negative  - POCT occult blood stool  6. Screening for colorectal cancer Will  order cologuard - Cologuard

## 2023-05-05 LAB — COLOGUARD: COLOGUARD: NEGATIVE

## 2023-05-16 ENCOUNTER — Inpatient Hospital Stay: Payer: 59 | Attending: Hematology | Admitting: Hematology

## 2023-05-16 ENCOUNTER — Inpatient Hospital Stay: Payer: 59

## 2023-05-16 VITALS — BP 113/72 | HR 92 | Temp 97.8°F | Resp 20 | Ht 66.5 in | Wt 131.2 lb

## 2023-05-16 DIAGNOSIS — C649 Malignant neoplasm of unspecified kidney, except renal pelvis: Secondary | ICD-10-CM | POA: Insufficient documentation

## 2023-05-16 DIAGNOSIS — D508 Other iron deficiency anemias: Secondary | ICD-10-CM | POA: Diagnosis not present

## 2023-05-16 DIAGNOSIS — R110 Nausea: Secondary | ICD-10-CM | POA: Diagnosis not present

## 2023-05-16 DIAGNOSIS — Z800 Family history of malignant neoplasm of digestive organs: Secondary | ICD-10-CM | POA: Diagnosis not present

## 2023-05-16 DIAGNOSIS — D49512 Neoplasm of unspecified behavior of left kidney: Secondary | ICD-10-CM | POA: Diagnosis not present

## 2023-05-16 DIAGNOSIS — R5383 Other fatigue: Secondary | ICD-10-CM | POA: Insufficient documentation

## 2023-05-16 DIAGNOSIS — E039 Hypothyroidism, unspecified: Secondary | ICD-10-CM | POA: Diagnosis not present

## 2023-05-16 DIAGNOSIS — J449 Chronic obstructive pulmonary disease, unspecified: Secondary | ICD-10-CM | POA: Insufficient documentation

## 2023-05-16 DIAGNOSIS — Z79890 Hormone replacement therapy (postmenopausal): Secondary | ICD-10-CM | POA: Diagnosis not present

## 2023-05-16 DIAGNOSIS — Z85828 Personal history of other malignant neoplasm of skin: Secondary | ICD-10-CM | POA: Diagnosis not present

## 2023-05-16 DIAGNOSIS — K219 Gastro-esophageal reflux disease without esophagitis: Secondary | ICD-10-CM | POA: Diagnosis not present

## 2023-05-16 DIAGNOSIS — Z79899 Other long term (current) drug therapy: Secondary | ICD-10-CM | POA: Diagnosis not present

## 2023-05-16 DIAGNOSIS — N189 Chronic kidney disease, unspecified: Secondary | ICD-10-CM | POA: Diagnosis not present

## 2023-05-16 DIAGNOSIS — G4700 Insomnia, unspecified: Secondary | ICD-10-CM | POA: Insufficient documentation

## 2023-05-16 DIAGNOSIS — Z87891 Personal history of nicotine dependence: Secondary | ICD-10-CM | POA: Insufficient documentation

## 2023-05-16 DIAGNOSIS — D509 Iron deficiency anemia, unspecified: Secondary | ICD-10-CM | POA: Insufficient documentation

## 2023-05-16 NOTE — Progress Notes (Signed)
[facility] [address] [place] [address]   Clinic Day:  05/16/2023  Referring physician: Assunta Found, MD  Patient Care Team: Assunta Found, MD as PCP - General (Family Medicine) Janalyn Harder, MD (Inactive) as Consulting Physician (Dermatology)   ASSESSMENT & PLAN:   Assessment:  1.  Microcytic anemia: - Patient seen at the request of Dr. Phillips Odor. - 04/26/2023: Hb-10.1, MCV-78, PLT-485, WBC-7.9 - 04/30/2023: Cologuard test-negative - 03/13/2023: Hb-10.4, MCV-79, PLT-460, creatinine 0.92, ferritin 8,% sat 5, TIBC 517, B12 and folic acid normal - She cannot tolerate oral iron therapy.  No ice pica.  No prior transfusions.  Reports worsening dizziness.  2.  Social/family history: - She works for [employer], office job.  No exposure to chemicals.  Non-smoker. - No family history of significant anemia.  Maternal aunt had colon cancer.  3.  Bilateral papillary renal cell carcinoma: - Right robotic partial nephrectomy (06/14/2008): 1.5 cm papillary renal cell carcinoma, Fuhrman grade 2, margins negative.  PT1 pNX. - Left robotic assisted laparoscopic partial nephrectomy (05/17/2022): Type II papillary renal cell carcinoma, nuclear grade 2, 1.8 cm, tumor limited to the kidney (pT1a), all margins negative. - She follows up with Dr. Laverle Patter.  Plan:  1.  Iron deficiency anemia: - I discussed her labs in detail.  As she cannot tolerate oral iron therapy, recommend parenteral iron therapy with 1 g dose of INFeD x 1. - Will give her premedications given her allergies.  She reported that she gets nauseous with steroids.  We will give her Zofran in the premeds.  We also discussed the possible anaphylactic reaction.  She is agreeable to proceed with parenteral iron therapy given her worsening symptoms of dizziness and fatigue. - RTC 8 weeks for follow-up with repeat CBC, ferritin and iron panel. - As she had recurrent bilateral papillary renal cell carcinoma, I have  recommended genetic testing.   Orders Placed This Encounter  Procedures   Genetic Screening Order    Standing Status:   Future    Standing Expiration Date:   05/15/2024   CBC    Standing Status:   Future    Standing Expiration Date:   05/15/2024   Iron and TIBC ([facility] ONLY)    Standing Status:   Future    Standing Expiration Date:   05/15/2024   Ferritin    Standing Status:   Future    Standing Expiration Date:   05/15/2024      Mikeal Hawthorne R Teague,acting as a scribe for Doreatha Massed, MD.,have documented all relevant documentation on the behalf of Doreatha Massed, MD,as directed by  Doreatha Massed, MD while in the presence of Doreatha Massed, MD.   I, Doreatha Massed MD, have reviewed the above documentation for accuracy and completeness, and I agree with the above.   Doreatha Massed, MD   8/1/20245:34 PM  CHIEF COMPLAINT/PURPOSE OF CONSULT:   Diagnosis: anemia  Current Therapy: INFeD  HISTORY OF PRESENT ILLNESS:   Theresa Holloway is a 72 y.o. female presenting to clinic today for evaluation of anemia at the request of Assunta Found, MD.  Her most recent CBC from 04/26/23 foud decreased hemoglobin at 10.1 and decreased HCT at 32.7. Iron panel from 03/14/23 found decreased ferritin at 8, elevated TIBC at 517, decreased iron at 25, decreased iron saturation at 5, and decreased  She had Cologuard testing on 04/30/23 that was negative.   Today, she states that she is doing well overall. Her appetite level is at 100%.  Her energy level is at 10%.   PAST MEDICAL HISTORY:   Past Medical History: Past Medical History:  Diagnosis Date   Anginal pain (HCC) 03/22/2017   due to stress   Asthma    Basal cell carcinoma    Cancer of kidney (HCC)    Chronic kidney disease 2009   cancer   Complication of anesthesia    PONV   COPD (chronic obstructive pulmonary disease) (HCC)    mild   GERD (gastroesophageal reflux disease)    History of back  surgery    Hypothyroidism    Insomnia 01/29/2013   Pneumonia 2019   Postconcussion syndrome 01/29/2013   Psoriasis    Right ankle injury    Squamous cell carcinoma of skin 02/23/2019   in situ on left forearm, lower - tx p bx   Squamous cell carcinoma of skin 02/23/2019   in situ on left thigh, mid - tx p bx   Squamous cell carcinoma of skin 02/23/2019   in situ on left lower, inner shin - tx p bx    Surgical History: Past Surgical History:  Procedure Laterality Date   ABDOMINAL HYSTERECTOMY  1995   BACK SURGERY  2010   x3   BREAST BIOPSY Left 2004   negative   CHOLECYSTECTOMY  1996   CYSTOSCOPY W/ URETERAL STENT PLACEMENT Left 05/24/2021   Procedure: CYSTOSCOPY WITH RETROGRADE PYELOGRAM/URETERAL STENT PLACEMENT;  Surgeon: Crist Fat, MD;  Location: [facility];  Service: Urology;  Laterality: Left;   CYSTOSCOPY WITH STENT PLACEMENT Bilateral 05/18/2021   Procedure: CYSTOSCOPY, BILATERAL RETROGRADE PYELOGRAM,  RIGHT STENT PLACEMENT, ATTEMPTED LEFT STENT PLACEMENT;  Surgeon: Jannifer Hick, MD;  Location: [facility];  Service: Urology;  Laterality: Bilateral;   ELBOW SURGERY Right 1997   IR NEPHROSTOMY PLACEMENT LEFT  05/19/2021   left foot surgery  2012   NEPHRECTOMY  2009   right foot surgery  2015   right knee surgery  1979   right ring finger surgery  2002   ROBOTIC ASSISTED LAPAROSCOPIC SACROCOLPOPEXY N/A 05/24/2021   Procedure: XI ROBOTIC ASSISTED LAPAROSCOPIC SACROCOLPOPEXY;  Surgeon: Crist Fat, MD;  Location: [facility];  Service: Urology;  Laterality: N/A;   ROBOTIC ASSITED PARTIAL NEPHRECTOMY Left 05/17/2022   Procedure: XI ROBOTIC ASSITED PARTIAL NEPHRECTOMY;  Surgeon: Heloise Purpura, MD;  Location: [facility];  Service: Urology;  Laterality: Left;   TOTAL HIP ARTHROPLASTY  09/2022    Social History: Social History   Socioeconomic History   Marital status: Divorced    Spouse name: Not on file   Number of children: 0   Years of education: Not on file    Highest education level: Not on file  Occupational History   Not on file  Tobacco Use   Smoking status: Former    Current packs/day: 0.00    Average packs/day: 1 pack/day for 10.0 years (10.0 ttl pk-yrs)    Types: Cigarettes    Start date: 58    Quit date: 2007    Years since quitting: 17.5   Smokeless tobacco: Never  Vaping Use   Vaping status: Never Used  Substance and Sexual Activity   Alcohol use: No   Drug use: No   Sexual activity: Yes    Birth control/protection: Surgical    Comment: hyst  Other Topics Concern   Not on file  Social History Narrative   Not on file   Social Determinants of Health   Financial Resource Strain: Low Risk  (04/15/2023)  Overall Financial Resource Strain (CARDIA)    Difficulty of Paying Living Expenses: Not hard at all  Food Insecurity: No Food Insecurity (04/15/2023)   Hunger Vital Sign    Worried About Running Out of Food in the Last Year: Never true    Ran Out of Food in the Last Year: Never true  Transportation Needs: No Transportation Needs (04/15/2023)   PRAPARE - Administrator, Civil Service (Medical): No    Lack of Transportation (Non-Medical): No  Physical Activity: Insufficiently Active (04/15/2023)   Exercise Vital Sign    Days of Exercise per Week: 3 days    Minutes of Exercise per Session: 40 min  Stress: No Stress Concern Present (04/15/2023)   [employer] of Occupational Health - Occupational Stress Questionnaire    Feeling of Stress : Not at all  Social Connections: Moderately Isolated (04/15/2023)   Social Connection and Isolation Panel [NHANES]    Frequency of Communication with Friends and Family: Three times a week    Frequency of Social Gatherings with Friends and Family: Three times a week    Attends Religious Services: 1 to 4 times per year    Active Member of Clubs or Organizations: No    Attends Banker Meetings: Never    Marital Status: Divorced  Catering manager Violence: Not At Risk  (04/15/2023)   Humiliation, Afraid, Rape, and Kick questionnaire    Fear of Current or Ex-Partner: No    Emotionally Abused: No    Physically Abused: No    Sexually Abused: No    Family History: Family History  Problem Relation Age of Onset   Heart failure Mother    COPD Mother    Heart attack Father    Breast cancer Neg Hx     Current Medications:  Current Outpatient Medications:    Albuterol Sulfate 2.5 MG/0.5ML NEBU, Take 2.5 mg by nebulization every 6 (six) hours as needed (Asthma)., Disp: , Rfl:    ALPRAZolam (XANAX) 1 MG tablet, Take 1 mg by mouth at bedtime. , Disp: , Rfl:    cetirizine (ZYRTEC) 10 MG tablet, Take 10 mg by mouth daily., Disp: , Rfl:    docusate sodium (COLACE) 100 MG capsule, Take 1 capsule (100 mg total) by mouth 2 (two) times daily., Disp: , Rfl:    estradiol (ESTRACE) 1 MG tablet, Take 1 tablet (1 mg total) by mouth daily., Disp: 90 tablet, Rfl: 4   fluticasone (FLONASE) 50 MCG/ACT nasal spray, Place 1 spray into both nostrils in the morning and at bedtime., Disp: , Rfl:    ipratropium (ATROVENT) 0.03 % nasal spray, Place 1 spray into both nostrils daily as needed for rhinitis., Disp: , Rfl:    liothyronine (CYTOMEL) 25 MCG tablet, Take 25 mcg by mouth daily., Disp: , Rfl:    meclizine (ANTIVERT) 25 MG tablet, Take 25 mg by mouth daily as needed for dizziness., Disp: , Rfl:    montelukast (SINGULAIR) 10 MG tablet, Take 10 mg by mouth at bedtime., Disp: , Rfl:    ondansetron (ZOFRAN) 4 MG tablet, Take 1 tablet (4 mg total) by mouth every 8 (eight) hours as needed for nausea or vomiting., Disp: 15 tablet, Rfl: 1   OTEZLA 30 MG TABS, TAKE 1 TABLET BY MOUTH 2 TIMES A DAY., Disp: 60 tablet, Rfl: 3   pantoprazole (PROTONIX) 40 MG tablet, Take 40 mg by mouth daily., Disp: , Rfl:    RESTASIS 0.05 % ophthalmic emulsion, Place 1 drop into  both eyes 2 (two) times daily., Disp: , Rfl:    traMADol (ULTRAM) 50 MG tablet, Take 1-2 tablets (50-100 mg total) by mouth every 6  (six) hours as needed for moderate pain or severe pain., Disp: 20 tablet, Rfl: 0   TRELEGY ELLIPTA 100-62.5-25 MCG/ACT AEPB, Inhale into the lungs daily., Disp: , Rfl:    Allergies: Allergies  Allergen Reactions   Bee Venom Anaphylaxis and Swelling   Codeine Anaphylaxis   Morphine And Codeine Anaphylaxis   Penicillins Anaphylaxis   Sulfa Antibiotics Anaphylaxis   Other     Cat gut suture - too long to heal    Prednisone     Excessive weight gain - 35lbs in 10 days   Skyrizi [Risankizumab] Nausea And Vomiting    Pt also got blisters when taking this med    REVIEW OF SYSTEMS:   Review of Systems  Constitutional:  Negative for chills, fatigue and fever.  HENT:   Negative for lump/mass, mouth sores, nosebleeds, sore throat and trouble swallowing.   Eyes:  Negative for eye problems.  Respiratory:  Positive for shortness of breath. Negative for cough.   Cardiovascular:  Negative for chest pain, leg swelling and palpitations.  Gastrointestinal:  Positive for nausea. Negative for abdominal pain, constipation, diarrhea and vomiting.  Genitourinary:  Negative for bladder incontinence, difficulty urinating, dysuria, frequency, hematuria and nocturia.   Musculoskeletal:  Negative for arthralgias, back pain, flank pain, myalgias and neck pain.  Skin:  Negative for itching and rash.  Neurological:  Positive for dizziness and headaches. Negative for numbness.  Hematological:  Does not bruise/bleed easily.  Psychiatric/Behavioral:  Positive for sleep disturbance. Negative for depression and suicidal ideas. The patient is not nervous/anxious.   All other systems reviewed and are negative.    VITALS:   Blood pressure 113/72, pulse 92, temperature 97.8 F (36.6 C), temperature source Oral, resp. rate 20, height 5' 6.5" (1.689 m), weight 131 lb 3.2 oz (59.5 kg), SpO2 100%.  Wt Readings from Last 3 Encounters:  05/16/23 131 lb 3.2 oz (59.5 kg)  04/15/23 135 lb (61.2 kg)  05/17/22 130 lb 15.3  oz (59.4 kg)    Body mass index is 20.86 kg/m.   PHYSICAL EXAM:   Physical Exam Vitals and nursing note reviewed. Exam conducted with a chaperone present.  Constitutional:      Appearance: Normal appearance.  Cardiovascular:     Rate and Rhythm: Normal rate and regular rhythm.     Pulses: Normal pulses.     Heart sounds: Normal heart sounds.  Pulmonary:     Effort: Pulmonary effort is normal.     Breath sounds: Normal breath sounds.  Abdominal:     Palpations: Abdomen is soft. There is no hepatomegaly, splenomegaly or mass.     Tenderness: There is no abdominal tenderness.  Musculoskeletal:     Right lower leg: No edema.     Left lower leg: No edema.  Lymphadenopathy:     Cervical: No cervical adenopathy.     Right cervical: No superficial, deep or posterior cervical adenopathy.    Left cervical: No superficial, deep or posterior cervical adenopathy.     Upper Body:     Right upper body: No supraclavicular or axillary adenopathy.     Left upper body: No supraclavicular or axillary adenopathy.  Neurological:     General: No focal deficit present.     Mental Status: She is alert and oriented to person, place, and time.  Psychiatric:  Mood and Affect: Mood normal.        Behavior: Behavior normal.     LABS:      Latest Ref Rng & Units 05/19/2022    6:08 AM 05/18/2022   10:46 AM 05/18/2022    3:30 AM  CBC  WBC 4.0 - 10.5 K/uL 11.4     Hemoglobin 12.0 - 15.0 g/dL 8.7  9.5  9.2   Hematocrit 36.0 - 46.0 % 26.6  29.9  28.7   Platelets 150 - 400 K/uL 328         Latest Ref Rng & Units 05/19/2022    6:08 AM 05/17/2022    3:57 PM 05/09/2022   10:20 AM  CMP  Glucose 70 - 99 mg/dL 409  98  97   BUN 8 - 23 mg/dL 11  15  13    Creatinine 0.44 - 1.00 mg/dL 8.11  9.14  7.82   Sodium 135 - 145 mmol/L 136  141  140   Potassium 3.5 - 5.1 mmol/L 3.1  3.6  3.6   Chloride 98 - 111 mmol/L 105  107  101   CO2 22 - 32 mmol/L 23  25  29    Calcium 8.9 - 10.3 mg/dL 8.4  9.0  9.5       No results found for: "CEA1", "CEA" / No results found for: "CEA1", "CEA" No results found for: "PSA1" No results found for: "NFA213" No results found for: "CAN125"  No results found for: "TOTALPROTELP", "ALBUMINELP", "A1GS", "A2GS", "BETS", "BETA2SER", "GAMS", "MSPIKE", "SPEI" Lab Results  Component Value Date   TIBC 517 (H) 04/16/2008   FERRITIN 6 (L) 04/16/2008   IRONPCTSAT 6 (L) 04/16/2008   No results found for: "LDH"   STUDIES:   No results found.

## 2023-05-16 NOTE — Patient Instructions (Addendum)
You were seen and examined today by Dr. Ellin Saba. Dr. Ellin Saba is a hematologist, meaning that he specializes in blood abnormalities. Dr. Ellin Saba discussed your past medical history, family history of cancers/blood conditions and the events that led to you being here today.  You were referred to Dr. Ellin Saba due to iron deficiency anemia.  We will arrange for you to have an iron infusion in the clinic.   Follow-up as scheduled.

## 2023-05-20 ENCOUNTER — Ambulatory Visit: Payer: 59

## 2023-05-21 MED FILL — Iron Dextran Inj 50 MG/ML (Elemental Iron): INTRAMUSCULAR | Qty: 1 | Status: AC

## 2023-05-22 ENCOUNTER — Inpatient Hospital Stay: Payer: 59

## 2023-05-22 VITALS — BP 117/60 | HR 89 | Temp 96.7°F | Resp 18

## 2023-05-22 DIAGNOSIS — D509 Iron deficiency anemia, unspecified: Secondary | ICD-10-CM | POA: Diagnosis not present

## 2023-05-22 DIAGNOSIS — D508 Other iron deficiency anemias: Secondary | ICD-10-CM

## 2023-05-22 MED ORDER — DIPHENHYDRAMINE HCL 25 MG PO CAPS: 50.0000 mg | ORAL_CAPSULE | Freq: Once | ORAL | Status: DC

## 2023-05-22 MED ORDER — FAMOTIDINE IN NACL 20-0.9 MG/50ML-% IV SOLN
20.0000 mg | Freq: Once | INTRAVENOUS | Status: AC
  Administered 2023-05-22: 20 mg via INTRAVENOUS
  Filled 2023-05-22: qty 50

## 2023-05-22 MED ORDER — ONDANSETRON HCL 4 MG/2ML IJ SOLN
8.0000 mg | Freq: Once | INTRAMUSCULAR | Status: AC
  Administered 2023-05-22: 8 mg via INTRAVENOUS
  Filled 2023-05-22: qty 4

## 2023-05-22 MED ORDER — SODIUM CHLORIDE 0.9 % IV SOLN
50.0000 mg | Freq: Once | INTRAVENOUS | Status: AC
  Administered 2023-05-22: 50 mg via INTRAVENOUS
  Filled 2023-05-22: qty 1

## 2023-05-22 MED ORDER — SODIUM CHLORIDE 0.9 % IV SOLN: 8.0000 mg | Freq: Once | INTRAVENOUS | Status: DC

## 2023-05-22 MED ORDER — SODIUM CHLORIDE 0.9 % IV SOLN: Freq: Once | INTRAVENOUS | Status: AC

## 2023-05-22 MED ORDER — CETIRIZINE HCL 10 MG/ML IV SOLN
10.0000 mg | Freq: Once | INTRAVENOUS | Status: AC
  Administered 2023-05-22: 10 mg via INTRAVENOUS
  Filled 2023-05-22: qty 1

## 2023-05-22 MED ORDER — ACETAMINOPHEN 325 MG PO TABS
650.0000 mg | ORAL_TABLET | Freq: Once | ORAL | Status: AC
  Administered 2023-05-22: 650 mg via ORAL
  Filled 2023-05-22: qty 2

## 2023-05-22 MED ORDER — METHYLPREDNISOLONE SODIUM SUCC 125 MG IJ SOLR
125.0000 mg | Freq: Once | INTRAMUSCULAR | Status: AC
  Administered 2023-05-22: 125 mg via INTRAVENOUS
  Filled 2023-05-22: qty 2

## 2023-05-22 MED ORDER — SODIUM CHLORIDE 0.9 % IV SOLN
950.0000 mg | Freq: Once | INTRAVENOUS | Status: AC
  Administered 2023-05-22: 950 mg via INTRAVENOUS
  Filled 2023-05-22: qty 19

## 2023-05-22 NOTE — Patient Instructions (Signed)
[facility]  Discharge Instructions: Thank you for choosing [facility] to provide your oncology and hematology care.  If you have a lab appointment with the Cancer Center - please note that after April 8th, 2024, all labs will be drawn in the cancer center.  You do not have to check in or register with the main entrance as you have in the past but will complete your check-in in the cancer center.  Wear comfortable clothing and clothing appropriate for easy access to any Portacath or PICC line.   We strive to give you quality time with your provider. You may need to reschedule your appointment if you arrive late (15 or more minutes).  Arriving late affects you and other patients whose appointments are after yours.  Also, if you miss three or more appointments without notifying the office, you may be dismissed from the clinic at the provider's discretion.      For prescription refill requests, have your pharmacy contact our office and allow 72 hours for refills to be completed.    Today you received the following chemotherapy and/or immunotherapy agents Infed      To help prevent nausea and vomiting after your treatment, we encourage you to take your nausea medication as directed.  BELOW ARE SYMPTOMS THAT SHOULD BE REPORTED IMMEDIATELY: *FEVER GREATER THAN 100.4 F (38 C) OR HIGHER *CHILLS OR SWEATING *NAUSEA AND VOMITING THAT IS NOT CONTROLLED WITH YOUR NAUSEA MEDICATION *UNUSUAL SHORTNESS OF BREATH *UNUSUAL BRUISING OR BLEEDING *URINARY PROBLEMS (pain or burning when urinating, or frequent urination) *BOWEL PROBLEMS (unusual diarrhea, constipation, pain near the anus) TENDERNESS IN MOUTH AND THROAT WITH OR WITHOUT PRESENCE OF ULCERS (sore throat, sores in mouth, or a toothache) UNUSUAL RASH, SWELLING OR PAIN  UNUSUAL VAGINAL DISCHARGE OR ITCHING   Items with * indicate a potential emergency and should be followed up as soon as possible or go to the  Emergency Department if any problems should occur.  Please show the CHEMOTHERAPY ALERT CARD or IMMUNOTHERAPY ALERT CARD at check-in to the Emergency Department and triage nurse.  Should you have questions after your visit or need to cancel or reschedule your appointment, please contact [facility] [phone]  and follow the prompts.  Office hours are 8:00 a.m. to 4:30 p.m. Monday - Friday. Please note that voicemails left after 4:00 p.m. may not be returned until the following business day.  We are closed weekends and major holidays. You have access to a nurse at all times for urgent questions. Please call the main number to the clinic [phone] and follow the prompts.  For any non-urgent questions, you may also contact your provider using MyChart. We now offer e-Visits for anyone 18 and older to request care online for non-urgent symptoms. For details visit [email or web address].   Also download the MyChart app! Go to the app store, search "MyChart", open the app, select [facility], and log in with your MyChart username and password.   

## 2023-05-22 NOTE — Progress Notes (Signed)
Patient present today for InFed infusion per providers order.  Vital signs WNL.  Patient c/o nausea all the time and is asking for something for nausea prior to InFed.  MD notified.  Message received from Dr. Ellin Saba to give Zofran 8 mg IV once.    Stable during infusion without adverse affects.  Vital signs stable.  No complaints at this time.  Discharge from clinic ambulatory in stable condition.  Alert and oriented X 3.  Follow up with [facility] as scheduled.

## 2023-06-27 ENCOUNTER — Telehealth: Payer: 59 | Admitting: Licensed Clinical Social Worker

## 2023-07-08 ENCOUNTER — Inpatient Hospital Stay: Payer: 59 | Attending: Hematology

## 2023-07-08 ENCOUNTER — Encounter: Payer: Self-pay | Admitting: Hematology

## 2023-07-08 DIAGNOSIS — Z800 Family history of malignant neoplasm of digestive organs: Secondary | ICD-10-CM | POA: Insufficient documentation

## 2023-07-08 DIAGNOSIS — E039 Hypothyroidism, unspecified: Secondary | ICD-10-CM | POA: Insufficient documentation

## 2023-07-08 DIAGNOSIS — K219 Gastro-esophageal reflux disease without esophagitis: Secondary | ICD-10-CM | POA: Insufficient documentation

## 2023-07-08 DIAGNOSIS — N189 Chronic kidney disease, unspecified: Secondary | ICD-10-CM | POA: Insufficient documentation

## 2023-07-08 DIAGNOSIS — Z79899 Other long term (current) drug therapy: Secondary | ICD-10-CM | POA: Diagnosis not present

## 2023-07-08 DIAGNOSIS — D508 Other iron deficiency anemias: Secondary | ICD-10-CM

## 2023-07-08 DIAGNOSIS — R112 Nausea with vomiting, unspecified: Secondary | ICD-10-CM | POA: Insufficient documentation

## 2023-07-08 DIAGNOSIS — D49512 Neoplasm of unspecified behavior of left kidney: Secondary | ICD-10-CM

## 2023-07-08 DIAGNOSIS — D509 Iron deficiency anemia, unspecified: Secondary | ICD-10-CM | POA: Insufficient documentation

## 2023-07-08 DIAGNOSIS — Z79890 Hormone replacement therapy (postmenopausal): Secondary | ICD-10-CM | POA: Insufficient documentation

## 2023-07-08 DIAGNOSIS — Z87891 Personal history of nicotine dependence: Secondary | ICD-10-CM | POA: Insufficient documentation

## 2023-07-08 DIAGNOSIS — Z85528 Personal history of other malignant neoplasm of kidney: Secondary | ICD-10-CM | POA: Insufficient documentation

## 2023-07-08 DIAGNOSIS — Z85828 Personal history of other malignant neoplasm of skin: Secondary | ICD-10-CM | POA: Insufficient documentation

## 2023-07-08 DIAGNOSIS — R519 Headache, unspecified: Secondary | ICD-10-CM | POA: Diagnosis not present

## 2023-07-08 DIAGNOSIS — J449 Chronic obstructive pulmonary disease, unspecified: Secondary | ICD-10-CM | POA: Diagnosis not present

## 2023-07-08 LAB — CBC
HCT: 40.1 % (ref 36.0–46.0)
Hemoglobin: 12.8 g/dL (ref 12.0–15.0)
MCH: 28.2 pg (ref 26.0–34.0)
MCHC: 31.9 g/dL (ref 30.0–36.0)
MCV: 88.3 fL (ref 80.0–100.0)
Platelets: 357 10*3/uL (ref 150–400)
RBC: 4.54 MIL/uL (ref 3.87–5.11)
RDW: 21.5 % — ABNORMAL HIGH (ref 11.5–15.5)
WBC: 10.4 10*3/uL (ref 4.0–10.5)
nRBC: 0 % (ref 0.0–0.2)

## 2023-07-08 LAB — IRON AND TIBC
Iron: 72 ug/dL (ref 28–170)
Saturation Ratios: 19 % (ref 10.4–31.8)
TIBC: 388 ug/dL (ref 250–450)
UIBC: 316 ug/dL

## 2023-07-08 LAB — FERRITIN: Ferritin: 92 ng/mL (ref 11–307)

## 2023-07-08 LAB — GENETIC SCREENING ORDER

## 2023-07-11 ENCOUNTER — Inpatient Hospital Stay: Payer: 59 | Admitting: Oncology

## 2023-07-11 VITALS — BP 128/50 | HR 85 | Temp 98.0°F | Resp 16 | Ht 66.5 in | Wt 138.6 lb

## 2023-07-11 DIAGNOSIS — D509 Iron deficiency anemia, unspecified: Secondary | ICD-10-CM | POA: Diagnosis not present

## 2023-07-11 DIAGNOSIS — D508 Other iron deficiency anemias: Secondary | ICD-10-CM

## 2023-07-11 DIAGNOSIS — R519 Headache, unspecified: Secondary | ICD-10-CM

## 2023-07-11 NOTE — Progress Notes (Signed)
[facility] [address] [place] [address]   Clinic Day:  07/11/2023  Referring physician: Assunta Found, MD  Patient Care Team: Assunta Found, MD as PCP - General (Family Medicine) Janalyn Harder, MD (Inactive) as Consulting Physician (Dermatology)   ASSESSMENT & PLAN:   Assessment:  1.  Microcytic anemia: - Patient seen at the request of Dr. Phillips Odor. - 04/26/2023: Hb-10.1, MCV-78, PLT-485, WBC-7.9 - 04/30/2023: Cologuard test-negative - 03/13/2023: Hb-10.4, MCV-79, PLT-460, creatinine 0.92, ferritin 8,% sat 5, TIBC 517, B12 and folic acid normal - She cannot tolerate oral iron therapy.  No ice pica.  No prior transfusions.  Reports worsening dizziness.  2.  Social/family history: - She works for [employer], office job.  No exposure to chemicals.  Non-smoker. - No family history of significant anemia.  Maternal aunt had colon cancer.  3.  Bilateral papillary renal cell carcinoma: - Right robotic partial nephrectomy (06/14/2008): 1.5 cm papillary renal cell carcinoma, Fuhrman grade 2, margins negative.  PT1 pNX. - Left robotic assisted laparoscopic partial nephrectomy (05/17/2022): Type II papillary renal cell carcinoma, nuclear grade 2, 1.8 cm, tumor limited to the kidney (pT1a), all margins negative. - She follows up with Dr. Laverle Patter. -Dr. Ellin Saba recommended genetic screening given recurrent renal cell carcinoma.  Results are still pending during dictation.  Plan:  1.  Iron deficiency anemia: - Received 1 g INFeD on 05/22/2023.  Tolerated infusion well. -She denies any melena, hematochezia or bright red blood per rectum. -Unable to tolerate oral iron secondary to GI upset. -Repeat labs from 07/08/2023 showed ferritin of 92 (8), iron saturation 19%(5%) and hemoglobin 12.8 (10.1).  Platelets are normal at 357,000. -No additional IV iron needed at this time. -Recommend follow-up in 3 to 4 months with labs a few days before.   2.  Frequent  headaches: -Previously diagnosed with migraines but states she outgrew these. -Has been having more frequent headaches over the past few weeks with associated nausea.  Has had a few episodes of vomiting as well.  Takes Zofran that she had on hand for her symptoms. -Reports she will reach out to her PCP with regards to these new symptoms.  PLAN SUMMARY: >> No additional IV iron needed at this time. >> Return to clinic in 3 to 4 months with follow-up and labs a few days before.    I spent 25 minutes dedicated to the care of this patient (face-to-face and non-face-to-face) on the date of the encounter to include what is described in the assessment and plan.   No orders of the defined types were placed in this encounter.   Mauro Kaufmann, NP   9/26/20245:12 AM  CHIEF COMPLAINT/PURPOSE OF CONSULT:   Diagnosis: anemia  Current Therapy: INFeD  HISTORY OF PRESENT ILLNESS:   Theresa Holloway is a 72 y.o. female presenting to clinic today for follow-up for iron deficiency anemia.  She received 1 g INFeD on 05/22/2023.  Reports tolerating infusion well.  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  States prior to that she would not have been able to be on her feet due to significant fatigue.  She has been able to mow her grass again and do housework chores she was struggling to do prior.  Denies any bleeding.  Reports headaches with occasional nausea and vomiting that were present prior to infusion although she feels have been more persistent over the past few  weeks.  She takes Zofran as needed for the nausea which appears to be helping.  Reports previously having migraines although she feels like she outgrew those many years ago.  Has not been evaluated for this but plans on speaking with her primary care doctor about it.  Today, she states that she is doing well overall. Her appetite level is at 100%. Her energy level is at  90%.   PAST MEDICAL HISTORY:   Past Medical History: Past Medical History:  Diagnosis Date   Anginal pain (HCC) 03/22/2017   due to stress   Asthma    Basal cell carcinoma    Cancer of kidney (HCC)    Chronic kidney disease 2009   cancer   Complication of anesthesia    PONV   COPD (chronic obstructive pulmonary disease) (HCC)    mild   GERD (gastroesophageal reflux disease)    History of back surgery    Hypothyroidism    Insomnia 01/29/2013   Pneumonia 2019   Postconcussion syndrome 01/29/2013   Psoriasis    Right ankle injury    Squamous cell carcinoma of skin 02/23/2019   in situ on left forearm, lower - tx p bx   Squamous cell carcinoma of skin 02/23/2019   in situ on left thigh, mid - tx p bx   Squamous cell carcinoma of skin 02/23/2019   in situ on left lower, inner shin - tx p bx    Surgical History: Past Surgical History:  Procedure Laterality Date   ABDOMINAL HYSTERECTOMY  1995   BACK SURGERY  2010   x3   BREAST BIOPSY Left 2004   negative   CHOLECYSTECTOMY  1996   CYSTOSCOPY W/ URETERAL STENT PLACEMENT Left 05/24/2021   Procedure: CYSTOSCOPY WITH RETROGRADE PYELOGRAM/URETERAL STENT PLACEMENT;  Surgeon: Crist Fat, MD;  Location: [facility];  Service: Urology;  Laterality: Left;   CYSTOSCOPY WITH STENT PLACEMENT Bilateral 05/18/2021   Procedure: CYSTOSCOPY, BILATERAL RETROGRADE PYELOGRAM,  RIGHT STENT PLACEMENT, ATTEMPTED LEFT STENT PLACEMENT;  Surgeon: Jannifer Hick, MD;  Location: [facility];  Service: Urology;  Laterality: Bilateral;   ELBOW SURGERY Right 1997   IR NEPHROSTOMY PLACEMENT LEFT  05/19/2021   left foot surgery  2012   NEPHRECTOMY  2009   right foot surgery  2015   right knee surgery  1979   right ring finger surgery  2002   ROBOTIC ASSISTED LAPAROSCOPIC SACROCOLPOPEXY N/A 05/24/2021   Procedure: XI ROBOTIC ASSISTED LAPAROSCOPIC SACROCOLPOPEXY;  Surgeon: Crist Fat, MD;  Location: [facility];  Service: Urology;  Laterality: N/A;    ROBOTIC ASSITED PARTIAL NEPHRECTOMY Left 05/17/2022   Procedure: XI ROBOTIC ASSITED PARTIAL NEPHRECTOMY;  Surgeon: Heloise Purpura, MD;  Location: [facility];  Service: Urology;  Laterality: Left;   TOTAL HIP ARTHROPLASTY  09/2022    Social History: Social History   Socioeconomic History   Marital status: Divorced    Spouse name: Not on file   Number of children: 0   Years of education: Not on file   Highest education level: Not on file  Occupational History   Not on file  Tobacco Use   Smoking status: Former    Current packs/day: 0.00    Average packs/day: 1 pack/day for 10.0 years (10.0 ttl pk-yrs)    Types: Cigarettes    Start date: 58    Quit date: 2007    Years since quitting: 17.7   Smokeless tobacco: Never  Vaping Use   Vaping status: Never Used  Substance and Sexual Activity   Alcohol use: No   Drug use: No   Sexual activity: Yes    Birth control/protection: Surgical    Comment: hyst  Other Topics Concern   Not on file  Social History Narrative   Not on file   Social Determinants of Health   Financial Resource Strain: Low Risk  (04/15/2023)   Overall Financial Resource Strain (CARDIA)    Difficulty of Paying Living Expenses: Not hard at all  Food Insecurity: No Food Insecurity (04/15/2023)   Hunger Vital Sign    Worried About Running Out of Food in the Last Year: Never true    Ran Out of Food in the Last Year: Never true  Transportation Needs: No Transportation Needs (04/15/2023)   PRAPARE - Administrator, Civil Service (Medical): No    Lack of Transportation (Non-Medical): No  Physical Activity: Insufficiently Active (04/15/2023)   Exercise Vital Sign    Days of Exercise per Week: 3 days    Minutes of Exercise per Session: 40 min  Stress: No Stress Concern Present (04/15/2023)   [employer] of Occupational Health - Occupational Stress Questionnaire    Feeling of Stress : Not at all  Social Connections: Moderately Isolated (04/15/2023)   Social  Connection and Isolation Panel [NHANES]    Frequency of Communication with Friends and Family: Three times a week    Frequency of Social Gatherings with Friends and Family: Three times a week    Attends Religious Services: 1 to 4 times per year    Active Member of Clubs or Organizations: No    Attends Banker Meetings: Never    Marital Status: Divorced  Catering manager Violence: Not At Risk (04/15/2023)   Humiliation, Afraid, Rape, and Kick questionnaire    Fear of Current or Ex-Partner: No    Emotionally Abused: No    Physically Abused: No    Sexually Abused: No    Family History: Family History  Problem Relation Age of Onset   Heart failure Mother    COPD Mother    Heart attack Father    Breast cancer Neg Hx     Current Medications:  Current Outpatient Medications:    Albuterol Sulfate 2.5 MG/0.5ML NEBU, Take 2.5 mg by nebulization every 6 (six) hours as needed (Asthma)., Disp: , Rfl:    ALPRAZolam (XANAX) 1 MG tablet, Take 1 mg by mouth at bedtime. , Disp: , Rfl:    cetirizine (ZYRTEC) 10 MG tablet, Take 10 mg by mouth daily., Disp: , Rfl:    docusate sodium (COLACE) 100 MG capsule, Take 1 capsule (100 mg total) by mouth 2 (two) times daily., Disp: , Rfl:    estradiol (ESTRACE) 1 MG tablet, Take 1 tablet (1 mg total) by mouth daily., Disp: 90 tablet, Rfl: 4   fluticasone (FLONASE) 50 MCG/ACT nasal spray, Place 1 spray into both nostrils in the morning and at bedtime., Disp: , Rfl:    ipratropium (ATROVENT) 0.03 % nasal spray, Place 1 spray into both nostrils daily as needed for rhinitis., Disp: , Rfl:    liothyronine (CYTOMEL) 25 MCG tablet, Take 25 mcg by mouth daily., Disp: , Rfl:    meclizine (ANTIVERT) 25 MG tablet, Take 25 mg by mouth daily as needed for dizziness., Disp: , Rfl:    montelukast (SINGULAIR) 10 MG tablet, Take 10 mg by mouth at bedtime., Disp: , Rfl:    ondansetron (ZOFRAN) 4 MG tablet, Take 1 tablet (4 mg total)  by mouth every 8 (eight) hours as  needed for nausea or vomiting., Disp: 15 tablet, Rfl: 1   OTEZLA 30 MG TABS, TAKE 1 TABLET BY MOUTH 2 TIMES A DAY., Disp: 60 tablet, Rfl: 3   pantoprazole (PROTONIX) 40 MG tablet, Take 40 mg by mouth daily., Disp: , Rfl:    RESTASIS 0.05 % ophthalmic emulsion, Place 1 drop into both eyes 2 (two) times daily., Disp: , Rfl:    traMADol (ULTRAM) 50 MG tablet, Take 1-2 tablets (50-100 mg total) by mouth every 6 (six) hours as needed for moderate pain or severe pain., Disp: 20 tablet, Rfl: 0   TRELEGY ELLIPTA 100-62.5-25 MCG/ACT AEPB, Inhale into the lungs daily., Disp: , Rfl:    Allergies: Allergies  Allergen Reactions   Bee Venom Anaphylaxis and Swelling   Codeine Anaphylaxis   Morphine And Codeine Anaphylaxis   Penicillins Anaphylaxis   Sulfa Antibiotics Anaphylaxis   Other     Cat gut suture - too long to heal    Prednisone     Excessive weight gain - 35lbs in 10 days   Skyrizi [Risankizumab] Nausea And Vomiting    Pt also got blisters when taking this med    REVIEW OF SYSTEMS:   Review of Systems  Constitutional:  Negative for fatigue.  Gastrointestinal:  Positive for nausea and vomiting.  Neurological:  Positive for headaches.  Psychiatric/Behavioral:  Positive for sleep disturbance (Disrupted).      VITALS:   There were no vitals taken for this visit.  Wt Readings from Last 3 Encounters:  05/16/23 131 lb 3.2 oz (59.5 kg)  04/15/23 135 lb (61.2 kg)  05/17/22 130 lb 15.3 oz (59.4 kg)    There is no height or weight on file to calculate BMI.   PHYSICAL EXAM:   Physical Exam Vitals reviewed.  Constitutional:      General: She is not in acute distress.    Appearance: Normal appearance.  Cardiovascular:     Rate and Rhythm: Normal rate and regular rhythm.  Pulmonary:     Effort: Pulmonary effort is normal.     Breath sounds: Normal breath sounds.  Abdominal:     General: Bowel sounds are normal.     Palpations: Abdomen is soft.  Musculoskeletal:        General:  No swelling. Normal range of motion.  Neurological:     Mental Status: She is alert and oriented to person, place, and time. Mental status is at baseline.     LABS:      Latest Ref Rng & Units 07/08/2023   11:03 AM 05/19/2022    6:08 AM 05/18/2022   10:46 AM  CBC  WBC 4.0 - 10.5 K/uL 10.4  11.4    Hemoglobin 12.0 - 15.0 g/dL 40.9  8.7  9.5   Hematocrit 36.0 - 46.0 % 40.1  26.6  29.9   Platelets 150 - 400 K/uL 357  328        Latest Ref Rng & Units 05/19/2022    6:08 AM 05/17/2022    3:57 PM 05/09/2022   10:20 AM  CMP  Glucose 70 - 99 mg/dL 811  98  97   BUN 8 - 23 mg/dL 11  15  13    Creatinine 0.44 - 1.00 mg/dL 9.14  7.82  9.56   Sodium 135 - 145 mmol/L 136  141  140   Potassium 3.5 - 5.1 mmol/L 3.1  3.6  3.6   Chloride 98 - 111  mmol/L 105  107  101   CO2 22 - 32 mmol/L 23  25  29    Calcium 8.9 - 10.3 mg/dL 8.4  9.0  9.5      No results found for: "CEA1", "CEA" / No results found for: "CEA1", "CEA" No results found for: "PSA1" No results found for: "KGM010" No results found for: "CAN125"  No results found for: "TOTALPROTELP", "ALBUMINELP", "A1GS", "A2GS", "BETS", "BETA2SER", "GAMS", "MSPIKE", "SPEI" Lab Results  Component Value Date   TIBC 388 07/08/2023   TIBC 517 (H) 04/16/2008   FERRITIN 92 07/08/2023   FERRITIN 6 (L) 04/16/2008   IRONPCTSAT 19 07/08/2023   IRONPCTSAT 6 (L) 04/16/2008   No results found for: "LDH"   STUDIES:   No results found.

## 2023-08-05 ENCOUNTER — Encounter: Payer: Self-pay | Admitting: Licensed Clinical Social Worker

## 2023-08-05 DIAGNOSIS — Z1379 Encounter for other screening for genetic and chromosomal anomalies: Secondary | ICD-10-CM | POA: Insufficient documentation

## 2023-08-12 ENCOUNTER — Other Ambulatory Visit: Payer: Self-pay | Admitting: Family Medicine

## 2023-08-12 DIAGNOSIS — Z1231 Encounter for screening mammogram for malignant neoplasm of breast: Secondary | ICD-10-CM

## 2023-08-28 ENCOUNTER — Encounter: Payer: Self-pay | Admitting: Hematology

## 2023-09-27 ENCOUNTER — Ambulatory Visit
Admission: RE | Admit: 2023-09-27 | Discharge: 2023-09-27 | Disposition: A | Discharge status: Home / Self Care | Payer: 59 | Source: Ambulatory Visit | Attending: Family Medicine | Admitting: Family Medicine

## 2023-09-27 DIAGNOSIS — Z1231 Encounter for screening mammogram for malignant neoplasm of breast: Secondary | ICD-10-CM

## 2023-11-06 ENCOUNTER — Other Ambulatory Visit: Payer: Self-pay

## 2023-11-06 DIAGNOSIS — D508 Other iron deficiency anemias: Secondary | ICD-10-CM

## 2023-11-07 ENCOUNTER — Encounter: Payer: Self-pay | Admitting: Hematology

## 2023-11-07 ENCOUNTER — Inpatient Hospital Stay: Payer: 59 | Attending: Hematology

## 2023-11-07 DIAGNOSIS — Z800 Family history of malignant neoplasm of digestive organs: Secondary | ICD-10-CM | POA: Diagnosis not present

## 2023-11-07 DIAGNOSIS — D509 Iron deficiency anemia, unspecified: Secondary | ICD-10-CM | POA: Insufficient documentation

## 2023-11-07 DIAGNOSIS — Z905 Acquired absence of kidney: Secondary | ICD-10-CM | POA: Insufficient documentation

## 2023-11-07 DIAGNOSIS — Z87891 Personal history of nicotine dependence: Secondary | ICD-10-CM | POA: Insufficient documentation

## 2023-11-07 DIAGNOSIS — D508 Other iron deficiency anemias: Secondary | ICD-10-CM

## 2023-11-07 DIAGNOSIS — Z85528 Personal history of other malignant neoplasm of kidney: Secondary | ICD-10-CM | POA: Diagnosis not present

## 2023-11-07 LAB — CBC WITH DIFFERENTIAL/PLATELET
Abs Immature Granulocytes: 0.02 10*3/uL (ref 0.00–0.07)
Basophils Absolute: 0.1 10*3/uL (ref 0.0–0.1)
Basophils Relative: 1 %
Eosinophils Absolute: 0.3 10*3/uL (ref 0.0–0.5)
Eosinophils Relative: 3 %
HCT: 41.4 % (ref 36.0–46.0)
Hemoglobin: 13.5 g/dL (ref 12.0–15.0)
Immature Granulocytes: 0 %
Lymphocytes Relative: 35 %
Lymphs Abs: 2.9 10*3/uL (ref 0.7–4.0)
MCH: 31 pg (ref 26.0–34.0)
MCHC: 32.6 g/dL (ref 30.0–36.0)
MCV: 95.2 fL (ref 80.0–100.0)
Monocytes Absolute: 0.4 10*3/uL (ref 0.1–1.0)
Monocytes Relative: 5 %
Neutro Abs: 4.6 10*3/uL (ref 1.7–7.7)
Neutrophils Relative %: 56 %
Platelets: 329 10*3/uL (ref 150–400)
RBC: 4.35 MIL/uL (ref 3.87–5.11)
RDW: 12.9 % (ref 11.5–15.5)
WBC: 8.4 10*3/uL (ref 4.0–10.5)
nRBC: 0 % (ref 0.0–0.2)

## 2023-11-07 LAB — FERRITIN: Ferritin: 52 ng/mL (ref 11–307)

## 2023-11-07 LAB — IRON AND TIBC
Iron: 120 ug/dL (ref 28–170)
Saturation Ratios: 29 % (ref 10.4–31.8)
TIBC: 408 ug/dL (ref 250–450)
UIBC: 288 ug/dL

## 2023-11-13 NOTE — Progress Notes (Unsigned)
[facility] [address], [place] [address]   Clinic Day:  11/14/2023  Referring physician: Assunta Found, MD  Patient Care Team: Assunta Found, MD as PCP - General (Family Medicine) Janalyn Harder, MD (Inactive) as Consulting Physician (Dermatology)   ASSESSMENT & PLAN:   Assessment:  1.  Microcytic anemia: - Patient seen at the request of Dr. Phillips Odor. - 04/26/2023: Hb-10.1, MCV-78, PLT-485, WBC-7.9 - 04/30/2023: Cologuard test-negative - 03/13/2023: Hb-10.4, MCV-79, PLT-460, creatinine 0.92, ferritin 8,% sat 5, TIBC 517, B12 and folic acid normal - She cannot tolerate oral iron therapy.  No ice pica.  No prior transfusions.  Reports worsening dizziness.  2.  Social/family history: - She works for [employer], office job.  No exposure to chemicals.  Non-smoker. - No family history of significant anemia.  Maternal aunt had colon cancer.  3.  Bilateral papillary renal cell carcinoma: - Right robotic partial nephrectomy (06/14/2008): 1.5 cm papillary renal cell carcinoma, Fuhrman grade 2, margins negative.  PT1 pNX. - Left robotic assisted laparoscopic partial nephrectomy (05/17/2022): Type II papillary renal cell carcinoma, nuclear grade 2, 1.8 cm, tumor limited to the kidney (pT1a), all margins negative. - She follows up with Dr. Laverle Patter. - She had [facility] genetic testing completed on 08/05/2023 which did not reveal any reportable genetic variants.   Plan:  1.  Iron deficiency anemia: - Received 1 g INFeD on 05/22/2023.  Tolerated infusion well. -She denies any melena, hematochezia or bright red blood per rectum. -Unable to tolerate oral iron secondary to GI upset. -Repeat labs from 11/07/23 showed ferritin of 52 (92), iron saturation 29%(19%) and hemoglobin 13.5 (12.8).  Platelets are normal at 329,000. -We discussed holding off on additional iron at this time although her ferritin levels are dropping.  I have asked her to reach out if she notices any new  symptoms that are concerning. -No additional IV iron needed at this time. -Recommend follow-up in 3 to 4 months with labs a few days before.   PLAN SUMMARY: >> No additional IV iron needed at this time. >> Return to clinic in 3 to 4 months with follow-up and labs a few days before.    I spent 25 minutes dedicated to the care of this patient (face-to-face and non-face-to-face) on the date of the encounter to include what is described in the assessment and plan.  No orders of the defined types were placed in this encounter.   Mauro Kaufmann, NP   1/30/202510:14 AM  CHIEF COMPLAINT/PURPOSE OF CONSULT:   Diagnosis: anemia  Current Therapy: INFeD  HISTORY OF PRESENT ILLNESS:   Mayana is a 73 y.o. female presenting to clinic today for follow-up for iron deficiency anemia.  She was seen in clinic on 07/11/2023.  No recent hospitalizations, surgeries or changes to baseline health.  She received 1 g INFeD on 05/22/2023.  Reports tolerating infusion well.  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    She denies any bleeding.  Reports occasional shortness of breath secondary to asthma.  Reports she is gaining weight and it is driving her crazy.  She eats pretty healthy and has been gluten-free for now several years.  She still experiences nausea secondary to prior GI surgery.  Reports when the weather warms up, she plans on getting outside to start walking again.  She continues to work full-time and is very sedentary.  Her appetite and  energy levels are 100%.   PAST MEDICAL HISTORY:   Past Medical History: Past Medical History:  Diagnosis Date   Anginal pain (HCC) 03/22/2017   due to stress   Asthma    Basal cell carcinoma    Cancer of kidney (HCC)    Chronic kidney disease 2009   cancer   Complication of anesthesia    PONV   COPD (chronic obstructive pulmonary disease) (HCC)    mild   GERD  (gastroesophageal reflux disease)    History of back surgery    Hypothyroidism    Insomnia 01/29/2013   Pneumonia 2019   Postconcussion syndrome 01/29/2013   Psoriasis    Right ankle injury    Squamous cell carcinoma of skin 02/23/2019   in situ on left forearm, lower - tx p bx   Squamous cell carcinoma of skin 02/23/2019   in situ on left thigh, mid - tx p bx   Squamous cell carcinoma of skin 02/23/2019   in situ on left lower, inner shin - tx p bx    Surgical History: Past Surgical History:  Procedure Laterality Date   ABDOMINAL HYSTERECTOMY  1995   BACK SURGERY  2010   x3   BREAST BIOPSY Left 2004   negative   CHOLECYSTECTOMY  1996   CYSTOSCOPY W/ URETERAL STENT PLACEMENT Left 05/24/2021   Procedure: CYSTOSCOPY WITH RETROGRADE PYELOGRAM/URETERAL STENT PLACEMENT;  Surgeon: Crist Fat, MD;  Location: [facility];  Service: Urology;  Laterality: Left;   CYSTOSCOPY WITH STENT PLACEMENT Bilateral 05/18/2021   Procedure: CYSTOSCOPY, BILATERAL RETROGRADE PYELOGRAM,  RIGHT STENT PLACEMENT, ATTEMPTED LEFT STENT PLACEMENT;  Surgeon: Jannifer Hick, MD;  Location: [facility];  Service: Urology;  Laterality: Bilateral;   ELBOW SURGERY Right 1997   IR NEPHROSTOMY PLACEMENT LEFT  05/19/2021   left foot surgery  2012   NEPHRECTOMY  2009   right foot surgery  2015   right knee surgery  1979   right ring finger surgery  2002   ROBOTIC ASSISTED LAPAROSCOPIC SACROCOLPOPEXY N/A 05/24/2021   Procedure: XI ROBOTIC ASSISTED LAPAROSCOPIC SACROCOLPOPEXY;  Surgeon: Crist Fat, MD;  Location: [facility];  Service: Urology;  Laterality: N/A;   ROBOTIC ASSITED PARTIAL NEPHRECTOMY Left 05/17/2022   Procedure: XI ROBOTIC ASSITED PARTIAL NEPHRECTOMY;  Surgeon: Heloise Purpura, MD;  Location: [facility];  Service: Urology;  Laterality: Left;   TOTAL HIP ARTHROPLASTY  09/2022    Social History: Social History   Socioeconomic History   Marital status: Divorced    Spouse name: Not on file   Number of  children: 0   Years of education: Not on file   Highest education level: Not on file  Occupational History   Not on file  Tobacco Use   Smoking status: Former    Current packs/day: 0.00    Average packs/day: 1 pack/day for 10.0 years (10.0 ttl pk-yrs)    Types: Cigarettes    Start date: 19    Quit date: 2007    Years since quitting: 18.0   Smokeless tobacco: Never  Vaping Use   Vaping status: Never Used  Substance and Sexual Activity   Alcohol use: No   Drug use: No   Sexual activity: Yes    Birth control/protection: Surgical    Comment: hyst  Other Topics Concern   Not on file  Social History Narrative   Not on file   Social Drivers of Health   Financial Resource Strain: Low Risk  (04/15/2023)   Overall  Financial Resource Strain (CARDIA)    Difficulty of Paying Living Expenses: Not hard at all  Food Insecurity: No Food Insecurity (04/15/2023)   Hunger Vital Sign    Worried About Running Out of Food in the Last Year: Never true    Ran Out of Food in the Last Year: Never true  Transportation Needs: No Transportation Needs (04/15/2023)   PRAPARE - Administrator, Civil Service (Medical): No    Lack of Transportation (Non-Medical): No  Physical Activity: Insufficiently Active (04/15/2023)   Exercise Vital Sign    Days of Exercise per Week: 3 days    Minutes of Exercise per Session: 40 min  Stress: Not on file (08/24/2023)  Social Connections: Moderately Isolated (04/15/2023)   Social Connection and Isolation Panel [NHANES]    Frequency of Communication with Friends and Family: Three times a week    Frequency of Social Gatherings with Friends and Family: Three times a week    Attends Religious Services: 1 to 4 times per year    Active Member of Clubs or Organizations: No    Attends Banker Meetings: Never    Marital Status: Divorced  Catering manager Violence: Not At Risk (04/15/2023)   Humiliation, Afraid, Rape, and Kick questionnaire    Fear of  Current or Ex-Partner: No    Emotionally Abused: No    Physically Abused: No    Sexually Abused: No    Family History: Family History  Problem Relation Age of Onset   Heart failure Mother    COPD Mother    Heart attack Father    Breast cancer Neg Hx     Current Medications:  Current Outpatient Medications:    Albuterol Sulfate 2.5 MG/0.5ML NEBU, Take 2.5 mg by nebulization every 6 (six) hours as needed (Asthma)., Disp: , Rfl:    ALPRAZolam (XANAX) 1 MG tablet, Take 1 mg by mouth at bedtime. , Disp: , Rfl:    cetirizine (ZYRTEC) 10 MG tablet, Take 10 mg by mouth daily., Disp: , Rfl:    docusate sodium (COLACE) 100 MG capsule, Take 1 capsule (100 mg total) by mouth 2 (two) times daily., Disp: , Rfl:    estradiol (ESTRACE) 1 MG tablet, Take 1 tablet (1 mg total) by mouth daily., Disp: 90 tablet, Rfl: 4   fluticasone (FLONASE) 50 MCG/ACT nasal spray, Place 1 spray into both nostrils in the morning and at bedtime., Disp: , Rfl:    ipratropium (ATROVENT) 0.03 % nasal spray, Place 1 spray into both nostrils daily as needed for rhinitis., Disp: , Rfl:    liothyronine (CYTOMEL) 25 MCG tablet, Take 25 mcg by mouth daily., Disp: , Rfl:    meclizine (ANTIVERT) 25 MG tablet, Take 25 mg by mouth daily as needed for dizziness., Disp: , Rfl:    montelukast (SINGULAIR) 10 MG tablet, Take 10 mg by mouth at bedtime., Disp: , Rfl:    ondansetron (ZOFRAN) 4 MG tablet, Take 1 tablet (4 mg total) by mouth every 8 (eight) hours as needed for nausea or vomiting., Disp: 15 tablet, Rfl: 1   OTEZLA 30 MG TABS, TAKE 1 TABLET BY MOUTH 2 TIMES A DAY., Disp: 60 tablet, Rfl: 3   pantoprazole (PROTONIX) 40 MG tablet, Take 40 mg by mouth daily., Disp: , Rfl:    RESTASIS 0.05 % ophthalmic emulsion, Place 1 drop into both eyes 2 (two) times daily., Disp: , Rfl:    traMADol (ULTRAM) 50 MG tablet, Take 1-2 tablets (50-100 mg total)  by mouth every 6 (six) hours as needed for moderate pain or severe pain., Disp: 20 tablet,  Rfl: 0   TRELEGY ELLIPTA 100-62.5-25 MCG/ACT AEPB, Inhale into the lungs daily., Disp: , Rfl:    Allergies: Allergies  Allergen Reactions   Bee Venom Anaphylaxis and Swelling   Codeine Anaphylaxis   Morphine And Codeine Anaphylaxis   Penicillins Anaphylaxis   Sulfa Antibiotics Anaphylaxis   Other     Cat gut suture - too long to heal    Prednisone     Excessive weight gain - 35lbs in 10 days   Skyrizi [Risankizumab] Nausea And Vomiting    Pt also got blisters when taking this med    REVIEW OF SYSTEMS:   Review of Systems  Respiratory:  Positive for shortness of breath.   Gastrointestinal:  Positive for nausea.     VITALS:   Blood pressure 129/71, pulse 82, temperature 97.8 F (36.6 C), temperature source Oral, resp. rate 18, weight 138 lb 14.2 oz (63 kg), SpO2 99%.  Wt Readings from Last 3 Encounters:  11/14/23 138 lb 14.2 oz (63 kg)  07/11/23 138 lb 9.6 oz (62.9 kg)  05/16/23 131 lb 3.2 oz (59.5 kg)    Body mass index is 22.08 kg/m.   PHYSICAL EXAM:   Physical Exam Constitutional:      Appearance: Normal appearance.  Cardiovascular:     Rate and Rhythm: Normal rate and regular rhythm.  Pulmonary:     Effort: Pulmonary effort is normal.     Breath sounds: Normal breath sounds.  Abdominal:     General: Bowel sounds are normal.     Palpations: Abdomen is soft.  Musculoskeletal:        General: No swelling. Normal range of motion.  Neurological:     Mental Status: She is alert and oriented to person, place, and time. Mental status is at baseline.     LABS:      Latest Ref Rng & Units 11/07/2023    9:45 AM 07/08/2023   11:03 AM 05/19/2022    6:08 AM  CBC  WBC 4.0 - 10.5 K/uL 8.4  10.4  11.4   Hemoglobin 12.0 - 15.0 g/dL 52.8  41.3  8.7   Hematocrit 36.0 - 46.0 % 41.4  40.1  26.6   Platelets 150 - 400 K/uL 329  357  328       Latest Ref Rng & Units 05/19/2022    6:08 AM 05/17/2022    3:57 PM 05/09/2022   10:20 AM  CMP  Glucose 70 - 99 mg/dL 244  98  97    BUN 8 - 23 mg/dL 11  15  13    Creatinine 0.44 - 1.00 mg/dL 0.10  2.72  5.36   Sodium 135 - 145 mmol/L 136  141  140   Potassium 3.5 - 5.1 mmol/L 3.1  3.6  3.6   Chloride 98 - 111 mmol/L 105  107  101   CO2 22 - 32 mmol/L 23  25  29    Calcium 8.9 - 10.3 mg/dL 8.4  9.0  9.5      No results found for: "CEA1", "CEA" / No results found for: "CEA1", "CEA" No results found for: "PSA1" No results found for: "UYQ034" No results found for: "CAN125"  No results found for: "TOTALPROTELP", "ALBUMINELP", "A1GS", "A2GS", "BETS", "BETA2SER", "GAMS", "MSPIKE", "SPEI" Lab Results  Component Value Date   TIBC 408 11/07/2023   TIBC 388 07/08/2023   TIBC 517 (H)  04/16/2008   FERRITIN 52 11/07/2023   FERRITIN 92 07/08/2023   FERRITIN 6 (L) 04/16/2008   IRONPCTSAT 29 11/07/2023   IRONPCTSAT 19 07/08/2023   IRONPCTSAT 6 (L) 04/16/2008   No results found for: "LDH"   STUDIES:   No results found.

## 2023-11-14 ENCOUNTER — Inpatient Hospital Stay: Payer: 59 | Admitting: Oncology

## 2023-11-14 VITALS — BP 129/71 | HR 82 | Temp 97.8°F | Resp 18 | Wt 138.9 lb

## 2023-11-14 DIAGNOSIS — D509 Iron deficiency anemia, unspecified: Secondary | ICD-10-CM | POA: Diagnosis not present

## 2023-11-14 DIAGNOSIS — D508 Other iron deficiency anemias: Secondary | ICD-10-CM

## 2024-01-06 ENCOUNTER — Encounter: Payer: Self-pay | Admitting: Hematology

## 2024-02-05 ENCOUNTER — Inpatient Hospital Stay: Payer: 59 | Attending: Hematology

## 2024-02-05 ENCOUNTER — Encounter: Payer: Self-pay | Admitting: Hematology

## 2024-02-05 DIAGNOSIS — D509 Iron deficiency anemia, unspecified: Secondary | ICD-10-CM | POA: Insufficient documentation

## 2024-02-05 DIAGNOSIS — D508 Other iron deficiency anemias: Secondary | ICD-10-CM

## 2024-02-05 LAB — CBC WITH DIFFERENTIAL/PLATELET
Abs Immature Granulocytes: 0.02 10*3/uL (ref 0.00–0.07)
Basophils Absolute: 0.1 10*3/uL (ref 0.0–0.1)
Basophils Relative: 1 %
Eosinophils Absolute: 0.2 10*3/uL (ref 0.0–0.5)
Eosinophils Relative: 2 %
HCT: 42.5 % (ref 36.0–46.0)
Hemoglobin: 13.9 g/dL (ref 12.0–15.0)
Immature Granulocytes: 0 %
Lymphocytes Relative: 41 %
Lymphs Abs: 3.4 10*3/uL (ref 0.7–4.0)
MCH: 31.2 pg (ref 26.0–34.0)
MCHC: 32.7 g/dL (ref 30.0–36.0)
MCV: 95.3 fL (ref 80.0–100.0)
Monocytes Absolute: 0.6 10*3/uL (ref 0.1–1.0)
Monocytes Relative: 7 %
Neutro Abs: 4.1 10*3/uL (ref 1.7–7.7)
Neutrophils Relative %: 49 %
Platelets: 346 10*3/uL (ref 150–400)
RBC: 4.46 MIL/uL (ref 3.87–5.11)
RDW: 13.2 % (ref 11.5–15.5)
WBC: 8.3 10*3/uL (ref 4.0–10.5)
nRBC: 0 % (ref 0.0–0.2)

## 2024-02-05 LAB — IRON AND TIBC
Iron: 75 ug/dL (ref 28–170)
Saturation Ratios: 18 % (ref 10.4–31.8)
TIBC: 427 ug/dL (ref 250–450)
UIBC: 352 ug/dL

## 2024-02-05 LAB — FERRITIN: Ferritin: 37 ng/mL (ref 11–307)

## 2024-02-12 ENCOUNTER — Ambulatory Visit: Payer: 59 | Admitting: Oncology

## 2024-02-13 ENCOUNTER — Inpatient Hospital Stay: Payer: 59 | Attending: Hematology | Admitting: Oncology

## 2024-02-13 DIAGNOSIS — J4489 Other specified chronic obstructive pulmonary disease: Secondary | ICD-10-CM | POA: Insufficient documentation

## 2024-02-13 DIAGNOSIS — Z87891 Personal history of nicotine dependence: Secondary | ICD-10-CM | POA: Insufficient documentation

## 2024-02-13 DIAGNOSIS — Z85828 Personal history of other malignant neoplasm of skin: Secondary | ICD-10-CM | POA: Diagnosis not present

## 2024-02-13 DIAGNOSIS — Z79890 Hormone replacement therapy (postmenopausal): Secondary | ICD-10-CM | POA: Insufficient documentation

## 2024-02-13 DIAGNOSIS — N189 Chronic kidney disease, unspecified: Secondary | ICD-10-CM | POA: Insufficient documentation

## 2024-02-13 DIAGNOSIS — D508 Other iron deficiency anemias: Secondary | ICD-10-CM

## 2024-02-13 DIAGNOSIS — Z800 Family history of malignant neoplasm of digestive organs: Secondary | ICD-10-CM | POA: Diagnosis not present

## 2024-02-13 DIAGNOSIS — D509 Iron deficiency anemia, unspecified: Secondary | ICD-10-CM | POA: Diagnosis present

## 2024-02-13 DIAGNOSIS — K30 Functional dyspepsia: Secondary | ICD-10-CM | POA: Diagnosis not present

## 2024-02-13 DIAGNOSIS — K219 Gastro-esophageal reflux disease without esophagitis: Secondary | ICD-10-CM | POA: Insufficient documentation

## 2024-02-13 DIAGNOSIS — Z85528 Personal history of other malignant neoplasm of kidney: Secondary | ICD-10-CM | POA: Diagnosis not present

## 2024-02-13 DIAGNOSIS — Z79899 Other long term (current) drug therapy: Secondary | ICD-10-CM | POA: Insufficient documentation

## 2024-02-13 DIAGNOSIS — E039 Hypothyroidism, unspecified: Secondary | ICD-10-CM | POA: Insufficient documentation

## 2024-02-13 NOTE — Progress Notes (Signed)
 [facility] [address] [place] [address]   Clinic Day:  02/13/2024  Referring physician: Minus Amel, MD  Patient Care Team: Minus Amel, MD as PCP - General (Family Medicine) Devon Fogo, MD (Inactive) as Consulting Physician (Dermatology)   ASSESSMENT & PLAN:   Assessment:  1.  Microcytic anemia: - Patient seen at the request of Dr. Glady Laming. - 04/26/2023: Hb-10.1, MCV-78, PLT-485, WBC-7.9 - 04/30/2023: Cologuard test-negative - 03/13/2023: Hb-10.4, MCV-79, PLT-460, creatinine 0.92, ferritin 8,% sat 5, TIBC 517, B12 and folic acid normal - She cannot tolerate oral iron  therapy.  No ice pica.  No prior transfusions.  Reports worsening dizziness.  2.  Social/family history: - She works for [employer], office job.  No exposure to chemicals.  Non-smoker. - No family history of significant anemia.  Maternal aunt had colon cancer.  3.  Bilateral papillary renal cell carcinoma: - Right robotic partial nephrectomy (06/14/2008): 1.5 cm papillary renal cell carcinoma, Fuhrman grade 2, margins negative.  PT1 pNX. - Left robotic assisted laparoscopic partial nephrectomy (05/17/2022): Type II papillary renal cell carcinoma, nuclear grade 2, 1.8 cm, tumor limited to the kidney (pT1a), all margins negative. - She follows up with Dr. Rozanne Corners. - She had [facility] genetic testing completed on 08/05/2023 which did not reveal any reportable genetic variants.   Plan:  1.  Iron  deficiency anemia: - Received 1 g INFeD  on 05/22/2023.  Tolerated infusion well. -She denies any melena, hematochezia or bright red blood per rectum. -Unable to tolerate oral iron  secondary to GI upset. -Repeat labs from 02/05/2024 show ferritin of 37, iron  saturations 18% with a hemoglobin of 13.9.  Platelets are normal at 346. - At her last visit, we discussed additional IV iron  but opted to wait to see how they were in April.  Although hemoglobin has maintained at 13.9, I would recommend 1 additional  dose of INFeD . -Recommend follow-up in 6 months with labs a few days before.   PLAN SUMMARY: >> Recommend 1 dose of INFeD . >> Return to clinic in 6 months with follow-up and labs a few days before.    I spent 25 minutes dedicated to the care of this patient (face-to-face and non-face-to-face) on the date of the encounter to include what is described in the assessment and plan.  No orders of the defined types were placed in this encounter.   Aurther Blue, NP   5/1/20258:35 AM  CHIEF COMPLAINT/PURPOSE OF CONSULT:   Diagnosis: anemia  Current Therapy: INFeD   HISTORY OF PRESENT ILLNESS:   Tyera is a 73 y.o. female presenting to clinic today for follow-up for iron  deficiency anemia.  She was seen in clinic on 11/14/2023.  No recent hospitalizations, surgeries or changes to baseline health.  She received 1 g INFeD  on 05/22/2023.  Reports tolerating infusion well.  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    She denies any bleeding.  Reports occasional shortness of breath secondary to asthma.  Reports she is gaining weight and it is driving her crazy.  She eats pretty healthy and has been gluten-free for now several years.  Reports she still continues to experience nausea and takes Zofran  daily.  Feels like this is worse when her iron  levels are low.  Reports occasional nausea prior to her GI surgery but nothing like it has been now.  She has been outside working out in the yard and exercising  and has lost several inches around her waist, hips legs.  Reports her scale is not decreasing.  She continues to work full-time and is very sedentary.  Her appetite level is 80% and energy levels are 50%.  Denies any pain.   PAST MEDICAL HISTORY:   Past Medical History: Past Medical History:  Diagnosis Date   Anginal pain (HCC) 03/22/2017   due to stress   Asthma    Basal cell carcinoma    Cancer of kidney (HCC)     Chronic kidney disease 2009   cancer   Complication of anesthesia    PONV   COPD (chronic obstructive pulmonary disease) (HCC)    mild   GERD (gastroesophageal reflux disease)    History of back surgery    Hypothyroidism    Insomnia 01/29/2013   Pneumonia 2019   Postconcussion syndrome 01/29/2013   Psoriasis    Right ankle injury    Squamous cell carcinoma of skin 02/23/2019   in situ on left forearm, lower - tx p bx   Squamous cell carcinoma of skin 02/23/2019   in situ on left thigh, mid - tx p bx   Squamous cell carcinoma of skin 02/23/2019   in situ on left lower, inner shin - tx p bx    Surgical History: Past Surgical History:  Procedure Laterality Date   ABDOMINAL HYSTERECTOMY  1995   BACK SURGERY  2010   x3   BREAST BIOPSY Left 2004   negative   CHOLECYSTECTOMY  1996   CYSTOSCOPY W/ URETERAL STENT PLACEMENT Left 05/24/2021   Procedure: CYSTOSCOPY WITH RETROGRADE PYELOGRAM/URETERAL STENT PLACEMENT;  Surgeon: Andrez Banker, MD;  Location: [facility];  Service: Urology;  Laterality: Left;   CYSTOSCOPY WITH STENT PLACEMENT Bilateral 05/18/2021   Procedure: CYSTOSCOPY, BILATERAL RETROGRADE PYELOGRAM,  RIGHT STENT PLACEMENT, ATTEMPTED LEFT STENT PLACEMENT;  Surgeon: Lahoma Pigg, MD;  Location: [facility];  Service: Urology;  Laterality: Bilateral;   ELBOW SURGERY Right 1997   IR NEPHROSTOMY PLACEMENT LEFT  05/19/2021   left foot surgery  2012   NEPHRECTOMY  2009   right foot surgery  2015   right knee surgery  1979   right ring finger surgery  2002   ROBOTIC ASSISTED LAPAROSCOPIC SACROCOLPOPEXY N/A 05/24/2021   Procedure: XI ROBOTIC ASSISTED LAPAROSCOPIC SACROCOLPOPEXY;  Surgeon: Andrez Banker, MD;  Location: [facility];  Service: Urology;  Laterality: N/A;   ROBOTIC ASSITED PARTIAL NEPHRECTOMY Left 05/17/2022   Procedure: XI ROBOTIC ASSITED PARTIAL NEPHRECTOMY;  Surgeon: Florencio Hunting, MD;  Location: [facility];  Service: Urology;  Laterality: Left;   TOTAL HIP  ARTHROPLASTY  09/2022    Social History: Social History   Socioeconomic History   Marital status: Divorced    Spouse name: Not on file   Number of children: 0   Years of education: Not on file   Highest education level: Not on file  Occupational History   Not on file  Tobacco Use   Smoking status: Former    Current packs/day: 0.00    Average packs/day: 1 pack/day for 10.0 years (10.0 ttl pk-yrs)    Types: Cigarettes    Start date: 79    Quit date: 2007    Years since quitting: 18.3   Smokeless tobacco: Never  Vaping Use   Vaping status: Never Used  Substance and Sexual Activity   Alcohol use: No   Drug use: No   Sexual activity: Yes    Birth control/protection: Surgical  Comment: hyst  Other Topics Concern   Not on file  Social History Narrative   Not on file   Social Drivers of Health   Financial Resource Strain: Low Risk  (04/15/2023)   Overall Financial Resource Strain (CARDIA)    Difficulty of Paying Living Expenses: Not hard at all  Food Insecurity: No Food Insecurity (04/15/2023)   Hunger Vital Sign    Worried About Running Out of Food in the Last Year: Never true    Ran Out of Food in the Last Year: Never true  Transportation Needs: No Transportation Needs (04/15/2023)   PRAPARE - Administrator, Civil Service (Medical): No    Lack of Transportation (Non-Medical): No  Physical Activity: Insufficiently Active (04/15/2023)   Exercise Vital Sign    Days of Exercise per Week: 3 days    Minutes of Exercise per Session: 40 min  Stress: Not on file (08/24/2023)  Social Connections: Moderately Isolated (04/15/2023)   Social Connection and Isolation Panel [NHANES]    Frequency of Communication with Friends and Family: Three times a week    Frequency of Social Gatherings with Friends and Family: Three times a week    Attends Religious Services: 1 to 4 times per year    Active Member of Clubs or Organizations: No    Attends Banker Meetings:  Never    Marital Status: Divorced  Catering manager Violence: Not At Risk (04/15/2023)   Humiliation, Afraid, Rape, and Kick questionnaire    Fear of Current or Ex-Partner: No    Emotionally Abused: No    Physically Abused: No    Sexually Abused: No    Family History: Family History  Problem Relation Age of Onset   Heart failure Mother    COPD Mother    Heart attack Father    Breast cancer Neg Hx     Current Medications:  Current Outpatient Medications:    Albuterol  Sulfate 2.5 MG/0.5ML NEBU, Take 2.5 mg by nebulization every 6 (six) hours as needed (Asthma)., Disp: , Rfl:    ALPRAZolam  (XANAX ) 1 MG tablet, Take 1 mg by mouth at bedtime. , Disp: , Rfl:    cetirizine  (ZYRTEC ) 10 MG tablet, Take 10 mg by mouth daily., Disp: , Rfl:    docusate sodium  (COLACE) 100 MG capsule, Take 1 capsule (100 mg total) by mouth 2 (two) times daily., Disp: , Rfl:    estradiol  (ESTRACE ) 1 MG tablet, Take 1 tablet (1 mg total) by mouth daily., Disp: 90 tablet, Rfl: 4   fluticasone  (FLONASE ) 50 MCG/ACT nasal spray, Place 1 spray into both nostrils in the morning and at bedtime., Disp: , Rfl:    ipratropium (ATROVENT ) 0.03 % nasal spray, Place 1 spray into both nostrils daily as needed for rhinitis., Disp: , Rfl:    liothyronine  (CYTOMEL ) 25 MCG tablet, Take 25 mcg by mouth daily., Disp: , Rfl:    meclizine  (ANTIVERT ) 25 MG tablet, Take 25 mg by mouth daily as needed for dizziness., Disp: , Rfl:    montelukast  (SINGULAIR ) 10 MG tablet, Take 10 mg by mouth at bedtime., Disp: , Rfl:    ondansetron  (ZOFRAN ) 4 MG tablet, Take 1 tablet (4 mg total) by mouth every 8 (eight) hours as needed for nausea or vomiting., Disp: 15 tablet, Rfl: 1   OTEZLA  30 MG TABS, TAKE 1 TABLET BY MOUTH 2 TIMES A DAY., Disp: 60 tablet, Rfl: 3   pantoprazole  (PROTONIX ) 40 MG tablet, Take 40 mg by mouth daily.,  Disp: , Rfl:    RESTASIS  0.05 % ophthalmic emulsion, Place 1 drop into both eyes 2 (two) times daily., Disp: , Rfl:     traMADol  (ULTRAM ) 50 MG tablet, Take 1-2 tablets (50-100 mg total) by mouth every 6 (six) hours as needed for moderate pain or severe pain., Disp: 20 tablet, Rfl: 0   TRELEGY ELLIPTA 100-62.5-25 MCG/ACT AEPB, Inhale into the lungs daily., Disp: , Rfl:    Allergies: Allergies  Allergen Reactions   Bee Venom Anaphylaxis and Swelling   Codeine Anaphylaxis   Morphine And Codeine Anaphylaxis   Penicillins Anaphylaxis   Sulfa Antibiotics Anaphylaxis   Other     Cat gut suture - too long to heal    Prednisone     Excessive weight gain - 35lbs in 10 days   Skyrizi [Risankizumab] Nausea And Vomiting    Pt also got blisters when taking this med    REVIEW OF SYSTEMS:   Review of Systems  Respiratory:  Positive for shortness of breath.   Gastrointestinal:  Positive for nausea.     VITALS:   There were no vitals taken for this visit.  Wt Readings from Last 3 Encounters:  11/14/23 138 lb 14.2 oz (63 kg)  07/11/23 138 lb 9.6 oz (62.9 kg)  05/16/23 131 lb 3.2 oz (59.5 kg)    There is no height or weight on file to calculate BMI.   PHYSICAL EXAM:   Physical Exam Constitutional:      Appearance: Normal appearance.  Cardiovascular:     Rate and Rhythm: Normal rate and regular rhythm.  Pulmonary:     Effort: Pulmonary effort is normal.     Breath sounds: Normal breath sounds.  Abdominal:     General: Bowel sounds are normal.     Palpations: Abdomen is soft.  Musculoskeletal:        General: No swelling. Normal range of motion.  Neurological:     Mental Status: She is alert and oriented to person, place, and time. Mental status is at baseline.     LABS:      Latest Ref Rng & Units 02/05/2024    9:53 AM 11/07/2023    9:45 AM 07/08/2023   11:03 AM  CBC  WBC 4.0 - 10.5 K/uL 8.3  8.4  10.4   Hemoglobin 12.0 - 15.0 g/dL 69.6  29.5  28.4   Hematocrit 36.0 - 46.0 % 42.5  41.4  40.1   Platelets 150 - 400 K/uL 346  329  357       Latest Ref Rng & Units 05/19/2022    6:08 AM  05/17/2022    3:57 PM 05/09/2022   10:20 AM  CMP  Glucose 70 - 99 mg/dL 132  98  97   BUN 8 - 23 mg/dL 11  15  13    Creatinine 0.44 - 1.00 mg/dL 4.40  1.02  7.25   Sodium 135 - 145 mmol/L 136  141  140   Potassium 3.5 - 5.1 mmol/L 3.1  3.6  3.6   Chloride 98 - 111 mmol/L 105  107  101   CO2 22 - 32 mmol/L 23  25  29    Calcium 8.9 - 10.3 mg/dL 8.4  9.0  9.5      No results found for: "CEA1", "CEA" / No results found for: "CEA1", "CEA" No results found for: "PSA1" No results found for: "DGU440" No results found for: "CAN125"  No results found for: "TOTALPROTELP", "ALBUMINELP", "A1GS", "A2GS", "  BETS", "BETA2SER", "GAMS", "MSPIKE", "SPEI" Lab Results  Component Value Date   TIBC 427 02/05/2024   TIBC 408 11/07/2023   TIBC 388 07/08/2023   FERRITIN 37 02/05/2024   FERRITIN 52 11/07/2023   FERRITIN 92 07/08/2023   IRONPCTSAT 18 02/05/2024   IRONPCTSAT 29 11/07/2023   IRONPCTSAT 19 07/08/2023   No results found for: "LDH"   STUDIES:   No results found.

## 2024-02-17 ENCOUNTER — Inpatient Hospital Stay

## 2024-02-17 VITALS — BP 109/54 | HR 89 | Temp 99.0°F | Resp 18

## 2024-02-17 DIAGNOSIS — D508 Other iron deficiency anemias: Secondary | ICD-10-CM

## 2024-02-17 DIAGNOSIS — D509 Iron deficiency anemia, unspecified: Secondary | ICD-10-CM | POA: Diagnosis not present

## 2024-02-17 MED ORDER — METHYLPREDNISOLONE SODIUM SUCC 125 MG IJ SOLR
125.0000 mg | Freq: Once | INTRAMUSCULAR | Status: AC
Start: 2024-02-17 — End: 2024-02-17
  Administered 2024-02-17: 125 mg via INTRAVENOUS
  Filled 2024-02-17: qty 2

## 2024-02-17 MED ORDER — IRON DEXTRAN 50 MG/ML IJ SOLN
1000.0000 mg | Freq: Once | INTRAMUSCULAR | Status: AC
  Administered 2024-02-17: 1000 mg via INTRAVENOUS
  Filled 2024-02-17: qty 20

## 2024-02-17 MED ORDER — ACETAMINOPHEN 325 MG PO TABS
650.0000 mg | ORAL_TABLET | Freq: Once | ORAL | Status: AC
  Administered 2024-02-17: 650 mg via ORAL
  Filled 2024-02-17: qty 2

## 2024-02-17 MED ORDER — FAMOTIDINE IN NACL 20-0.9 MG/50ML-% IV SOLN
20.0000 mg | Freq: Once | INTRAVENOUS | Status: AC
  Administered 2024-02-17: 20 mg via INTRAVENOUS
  Filled 2024-02-17: qty 50

## 2024-02-17 MED ORDER — CETIRIZINE HCL 10 MG/ML IV SOLN
10.0000 mg | Freq: Once | INTRAVENOUS | Status: AC
  Administered 2024-02-17: 10 mg via INTRAVENOUS
  Filled 2024-02-17: qty 1

## 2024-02-17 MED ORDER — SODIUM CHLORIDE 0.9 % IV SOLN: Freq: Once | INTRAVENOUS | Status: AC

## 2024-02-17 MED ORDER — ONDANSETRON HCL 4 MG/2ML IJ SOLN
8.0000 mg | Freq: Once | INTRAMUSCULAR | Status: AC
  Administered 2024-02-17: 8 mg via INTRAVENOUS
  Filled 2024-02-17: qty 4

## 2024-02-17 NOTE — Patient Instructions (Signed)
 [facility] [place] - A DEPT OF [facility]  Discharge Instructions: Thank you for choosing [facility] to provide your oncology and hematology care.  If you have a lab appointment with the Cancer Center - please note that after April 8th, 2024, all labs will be drawn in the cancer center.  You do not have to check in or register with the main entrance as you have in the past but will complete your check-in in the cancer center.  Wear comfortable clothing and clothing appropriate for easy access to any Portacath or PICC line.   We strive to give you quality time with your provider. You may need to reschedule your appointment if you arrive late (15 or more minutes).  Arriving late affects you and other patients whose appointments are after yours.  Also, if you miss three or more appointments without notifying the office, you may be dismissed from the clinic at the provider's discretion.      For prescription refill requests, have your pharmacy contact our office and allow 72 hours for refills to be completed.    Today you received the following chemotherapy and/or immunotherapy agents Infed      To help prevent nausea and vomiting after your treatment, we encourage you to take your nausea medication as directed.  BELOW ARE SYMPTOMS THAT SHOULD BE REPORTED IMMEDIATELY: *FEVER GREATER THAN 100.4 F (38 C) OR HIGHER *CHILLS OR SWEATING *NAUSEA AND VOMITING THAT IS NOT CONTROLLED WITH YOUR NAUSEA MEDICATION *UNUSUAL SHORTNESS OF BREATH *UNUSUAL BRUISING OR BLEEDING *URINARY PROBLEMS (pain or burning when urinating, or frequent urination) *BOWEL PROBLEMS (unusual diarrhea, constipation, pain near the anus) TENDERNESS IN MOUTH AND THROAT WITH OR WITHOUT PRESENCE OF ULCERS (sore throat, sores in mouth, or a toothache) UNUSUAL RASH, SWELLING OR PAIN  UNUSUAL VAGINAL DISCHARGE OR ITCHING   Items with * indicate a potential emergency and should be followed up as  soon as possible or go to the Emergency Department if any problems should occur.  Please show the CHEMOTHERAPY ALERT CARD or IMMUNOTHERAPY ALERT CARD at check-in to the Emergency Department and triage nurse.  Should you have questions after your visit or need to cancel or reschedule your appointment, please contact [facility] [place] - A DEPT OF [facility] [phone]  and follow the prompts.  Office hours are 8:00 a.m. to 4:30 p.m. Monday - Friday. Please note that voicemails left after 4:00 p.m. may not be returned until the following business day.  We are closed weekends and major holidays. You have access to a nurse at all times for urgent questions. Please call the main number to the clinic [phone] and follow the prompts.  For any non-urgent questions, you may also contact your provider using MyChart. We now offer e-Visits for anyone 6 and older to request care online for non-urgent symptoms. For details visit [email or web address].   Also download the MyChart app! Go to the app store, search "MyChart", open the app, select [place], and log in with your MyChart username and password.

## 2024-02-17 NOTE — Progress Notes (Signed)
 Patient presents today for Infed infusion per providers order.  Vital signs WNL.  Patient has no new complaints at this time.  Peripheral IV started and blood return noted pre and post infusion.  Treatment given today per MD orders.  Stable during infusion without adverse affects.  Vital signs stable.  No complaints at this time.  Discharge from clinic ambulatory in stable condition.  Alert and oriented X 3.  Follow up with [facility] as scheduled.

## 2024-05-04 ENCOUNTER — Encounter: Payer: Self-pay | Admitting: Hematology

## 2024-05-25 ENCOUNTER — Other Ambulatory Visit: Payer: Self-pay | Admitting: Adult Health

## 2024-06-03 ENCOUNTER — Encounter: Payer: Self-pay | Admitting: Adult Health

## 2024-06-03 ENCOUNTER — Ambulatory Visit: Admitting: Adult Health

## 2024-06-03 VITALS — BP 117/72 | HR 87 | Ht 66.0 in | Wt 140.0 lb

## 2024-06-03 DIAGNOSIS — Z1331 Encounter for screening for depression: Secondary | ICD-10-CM | POA: Diagnosis not present

## 2024-06-03 DIAGNOSIS — Z01419 Encounter for gynecological examination (general) (routine) without abnormal findings: Secondary | ICD-10-CM

## 2024-06-03 DIAGNOSIS — Z1211 Encounter for screening for malignant neoplasm of colon: Secondary | ICD-10-CM | POA: Diagnosis not present

## 2024-06-03 DIAGNOSIS — N816 Rectocele: Secondary | ICD-10-CM

## 2024-06-03 DIAGNOSIS — Z90710 Acquired absence of both cervix and uterus: Secondary | ICD-10-CM | POA: Diagnosis not present

## 2024-06-03 DIAGNOSIS — Z79818 Long term (current) use of other agents affecting estrogen receptors and estrogen levels: Secondary | ICD-10-CM

## 2024-06-03 LAB — HEMOCCULT GUIAC POC 1CARD (OFFICE): Fecal Occult Blood, POC: NEGATIVE

## 2024-06-03 MED ORDER — ESTRADIOL 1 MG PO TABS: 1.0000 mg | ORAL_TABLET | Freq: Every day | ORAL | 4 refills | Status: AC

## 2024-06-03 NOTE — Progress Notes (Signed)
 Patient ID: Theresa Holloway, female   DOB: Mar 04, 1951, 73 y.o.   MRN: [id number] History of Present Illness: Theresa Holloway is a 73 year old white female, divorced, sp hysterectomy in for a well woman gyn exam, and refill estrace  1 mg. She is still working  PCP is Dr Marvine  Current Medications, Allergies, Past Medical History, Past Surgical History, Family History and Social History were reviewed in [employer] record.     Review of Systems: Patient denies any headaches, hearing loss, fatigue, blurred vision, shortness of breath, chest pain, abdominal pain, problems with bowel movements, urination, or intercourse. No joint pain or mood swings.     Physical Exam:BP 117/72 (BP Location: Right Arm, Patient Position: Sitting, Cuff Size: Normal)   Pulse 87   Ht 5' 6 (1.676 m)   Wt 140 lb (63.5 kg)   BMI 22.60 kg/m   General:  Well developed, well nourished, no acute distress Skin:  Warm and dry Neck:  Midline trachea, normal thyroid , good ROM, no lymphadenopathy, no carotid bruits heard  Lungs; Clear to auscultation bilaterally Breast:  No dominant palpable mass, retraction, or nipple discharge Cardiovascular: Regular rate and rhythm Abdomen:  Soft, non tender, no hepatosplenomegaly Pelvic:  External genitalia is normal in appearance, no lesions.  The vagina is pale. Urethra has no lesions or masses. The cervix and uterus are absent.  No adnexal masses or tenderness noted.Bladder is non tender, no masses felt. Rectal: Good sphincter tone, no polyps, or hemorrhoids felt.  Hemoccult negative.+rectocele  Extremities/musculoskeletal:  No swelling or varicosities noted, no clubbing or cyanosis Psych:  No mood changes, alert and cooperative,seems happy AA is 0 Fall risk is low    06/03/2024    3:43 PM 04/15/2023    2:36 PM 04/10/2022    3:33 PM  Depression screen PHQ 2/9  Decreased Interest 0 0 0  Down, Depressed, Hopeless 0 0 0  PHQ - 2 Score 0 0 0  Altered sleeping 0 0 0   Tired, decreased energy 1 0 0  Change in appetite 3 0 0  Feeling bad or failure about yourself  0 0 0  Trouble concentrating 0 0 0  Moving slowly or fidgety/restless 0 0 0  Suicidal thoughts 0 0 0  PHQ-9 Score 4 0 0       06/03/2024    3:43 PM 04/15/2023    2:37 PM 04/10/2022    3:33 PM 04/06/2021    4:06 PM  GAD 7 : Generalized Anxiety Score  Nervous, Anxious, on Edge 0 0 0 1  Control/stop worrying 0 0 0 1  Worry too much - different things 0 0 0 1  Trouble relaxing 0 0 0 1  Restless 0 0 0 0  Easily annoyed or irritable 0 0 0 1  Afraid - awful might happen 0 0 0 0  Total GAD 7 Score 0 0 0 5    Upstream - 06/03/24 1539       Pregnancy Intention Screening   Does the patient want to become pregnant in the next year? N/A    Does the patient's partner want to become pregnant in the next year? N/A    Would the patient like to discuss contraceptive options today? N/A      Contraception Wrap Up   Current Method Female Sterilization   hyst   End Method Female Sterilization   hyst   Contraception Counseling Provided No  Pt gave verbal consent for exam without a chaperone  Impression and plan: 1. Encounter for well woman exam with routine gynecological exam (Primary) Physical in 1 year Labs with PCP Mammogram was negative 09/27/23 Cologuard in 2027  Stay active   2. S/P hysterectomy   3. Current use of estrogen therapy She is aware if risks and wants to continue  Refilled estrace  1 mg po Meds ordered this encounter  Medications   estradiol  (ESTRACE ) 1 MG tablet    Sig: Take 1 tablet (1 mg total) by mouth daily.    Dispense:  90 tablet    Refill:  4    Supervising Provider:   JAYNE MINDER H [[id number]]     4. Rectocele  5. Encounter for screening fecal occult blood testing Hemoccult was negative  - POCT occult blood stool

## 2024-08-14 ENCOUNTER — Other Ambulatory Visit: Payer: Self-pay | Admitting: Family Medicine

## 2024-08-14 DIAGNOSIS — Z1231 Encounter for screening mammogram for malignant neoplasm of breast: Secondary | ICD-10-CM

## 2024-08-20 ENCOUNTER — Inpatient Hospital Stay

## 2024-08-27 ENCOUNTER — Inpatient Hospital Stay: Admitting: Oncology

## 2024-09-30 ENCOUNTER — Inpatient Hospital Stay: Admission: RE | Admit: 2024-09-30 | Discharge: 2024-09-30 | Attending: Family Medicine | Admitting: Family Medicine

## 2024-09-30 DIAGNOSIS — Z1231 Encounter for screening mammogram for malignant neoplasm of breast: Secondary | ICD-10-CM

## 2024-10-12 ENCOUNTER — Encounter: Payer: Self-pay | Admitting: *Deleted
